# Patient Record
Sex: Female | Born: 1985 | Race: White | Hispanic: No | Marital: Single | Smoking: Current every day smoker
Health system: Southern US, Community
[De-identification: ages and names within clinical notes are randomized; demographics above are authoritative.]

## PROBLEM LIST (undated history)

## (undated) DIAGNOSIS — N83209 Unspecified ovarian cyst, unspecified side: Secondary | ICD-10-CM

## (undated) DIAGNOSIS — G8929 Other chronic pain: Secondary | ICD-10-CM

## (undated) DIAGNOSIS — F419 Anxiety disorder, unspecified: Secondary | ICD-10-CM

## (undated) DIAGNOSIS — R102 Pelvic and perineal pain unspecified side: Secondary | ICD-10-CM

## (undated) DIAGNOSIS — M549 Dorsalgia, unspecified: Secondary | ICD-10-CM

## (undated) DIAGNOSIS — R001 Bradycardia, unspecified: Secondary | ICD-10-CM

## (undated) DIAGNOSIS — Z765 Malingerer [conscious simulation]: Secondary | ICD-10-CM

## (undated) DIAGNOSIS — N289 Disorder of kidney and ureter, unspecified: Secondary | ICD-10-CM

## (undated) HISTORY — PX: APPENDECTOMY: SHX54

---

## 2008-10-29 ENCOUNTER — Ambulatory Visit: Payer: Self-pay | Admitting: Family Medicine

## 2010-01-17 ENCOUNTER — Emergency Department (HOSPITAL_COMMUNITY): Admission: EM | Admit: 2010-01-17 | Discharge: 2010-01-18 | Payer: Self-pay | Admitting: Emergency Medicine

## 2010-01-23 ENCOUNTER — Emergency Department (HOSPITAL_COMMUNITY): Admission: EM | Admit: 2010-01-23 | Discharge: 2010-01-23 | Payer: Self-pay | Admitting: Emergency Medicine

## 2010-07-02 ENCOUNTER — Emergency Department (HOSPITAL_COMMUNITY): Admission: EM | Admit: 2010-07-02 | Discharge: 2010-07-02 | Payer: Self-pay | Admitting: Emergency Medicine

## 2010-09-25 ENCOUNTER — Encounter: Payer: Self-pay | Admitting: Family Medicine

## 2010-09-27 ENCOUNTER — Emergency Department (HOSPITAL_COMMUNITY)
Admission: EM | Admit: 2010-09-27 | Discharge: 2010-09-27 | Disposition: A | Discharge status: Home / Self Care | Payer: Medicaid Other | Attending: Emergency Medicine | Admitting: Emergency Medicine

## 2010-09-27 ENCOUNTER — Emergency Department (HOSPITAL_COMMUNITY): Payer: Medicaid Other

## 2010-09-27 DIAGNOSIS — S20219A Contusion of unspecified front wall of thorax, initial encounter: Secondary | ICD-10-CM | POA: Insufficient documentation

## 2010-09-27 DIAGNOSIS — R071 Chest pain on breathing: Secondary | ICD-10-CM | POA: Insufficient documentation

## 2010-09-27 DIAGNOSIS — Y9229 Other specified public building as the place of occurrence of the external cause: Secondary | ICD-10-CM | POA: Insufficient documentation

## 2010-09-27 DIAGNOSIS — W52XXXA Crushed, pushed or stepped on by crowd or human stampede, initial encounter: Secondary | ICD-10-CM | POA: Insufficient documentation

## 2010-09-27 LAB — POCT PREGNANCY, URINE: Preg Test, Ur: NEGATIVE

## 2010-10-23 ENCOUNTER — Encounter: Payer: Self-pay | Admitting: Family Medicine

## 2011-01-25 ENCOUNTER — Emergency Department (HOSPITAL_COMMUNITY)
Admission: EM | Admit: 2011-01-25 | Discharge: 2011-01-25 | Disposition: A | Discharge status: Home / Self Care | Payer: Medicaid Other | Attending: Emergency Medicine | Admitting: Emergency Medicine

## 2011-01-25 ENCOUNTER — Emergency Department (HOSPITAL_COMMUNITY): Payer: Medicaid Other

## 2011-01-25 DIAGNOSIS — F17200 Nicotine dependence, unspecified, uncomplicated: Secondary | ICD-10-CM | POA: Insufficient documentation

## 2011-01-25 DIAGNOSIS — R109 Unspecified abdominal pain: Secondary | ICD-10-CM | POA: Insufficient documentation

## 2011-01-25 DIAGNOSIS — Z3201 Encounter for pregnancy test, result positive: Secondary | ICD-10-CM | POA: Insufficient documentation

## 2011-01-25 LAB — URINALYSIS, ROUTINE W REFLEX MICROSCOPIC
Nitrite: NEGATIVE
Protein, ur: NEGATIVE mg/dL
Urobilinogen, UA: 0.2 mg/dL (ref 0.0–1.0)

## 2011-01-25 LAB — BASIC METABOLIC PANEL
BUN: 8 mg/dL (ref 6–23)
CO2: 26 mEq/L (ref 19–32)
Calcium: 10 mg/dL (ref 8.4–10.5)
Glucose, Bld: 120 mg/dL — ABNORMAL HIGH (ref 70–99)
Potassium: 3 mEq/L — ABNORMAL LOW (ref 3.5–5.1)
Sodium: 132 mEq/L — ABNORMAL LOW (ref 135–145)

## 2011-01-25 LAB — DIFFERENTIAL
Basophils Absolute: 0 10*3/uL (ref 0.0–0.1)
Lymphocytes Relative: 21 % (ref 12–46)
Lymphs Abs: 3.2 10*3/uL (ref 0.7–4.0)
Monocytes Absolute: 0.6 10*3/uL (ref 0.1–1.0)
Neutro Abs: 11.3 10*3/uL — ABNORMAL HIGH (ref 1.7–7.7)

## 2011-01-25 LAB — CBC
HCT: 36.3 % (ref 36.0–46.0)
Hemoglobin: 12.9 g/dL (ref 12.0–15.0)
MCHC: 35.5 g/dL (ref 30.0–36.0)
MCV: 90.1 fL (ref 78.0–100.0)

## 2011-01-25 LAB — URINE MICROSCOPIC-ADD ON

## 2011-01-25 LAB — POCT PREGNANCY, URINE: Preg Test, Ur: POSITIVE

## 2011-01-25 LAB — WET PREP, GENITAL

## 2011-01-27 LAB — GC/CHLAMYDIA PROBE AMP, GENITAL
Chlamydia, DNA Probe: NEGATIVE
GC Probe Amp, Genital: NEGATIVE

## 2011-02-18 ENCOUNTER — Emergency Department (HOSPITAL_COMMUNITY)
Admission: EM | Admit: 2011-02-18 | Discharge: 2011-02-18 | Discharge status: Against Medical Advice | Payer: Medicaid Other | Attending: Emergency Medicine | Admitting: Emergency Medicine

## 2011-02-18 DIAGNOSIS — R109 Unspecified abdominal pain: Secondary | ICD-10-CM | POA: Insufficient documentation

## 2011-02-18 DIAGNOSIS — F17200 Nicotine dependence, unspecified, uncomplicated: Secondary | ICD-10-CM | POA: Insufficient documentation

## 2011-02-18 DIAGNOSIS — O99891 Other specified diseases and conditions complicating pregnancy: Secondary | ICD-10-CM | POA: Insufficient documentation

## 2011-02-18 LAB — URINALYSIS, ROUTINE W REFLEX MICROSCOPIC
Nitrite: NEGATIVE
Specific Gravity, Urine: 1.02 (ref 1.005–1.030)
pH: 8.5 — ABNORMAL HIGH (ref 5.0–8.0)

## 2011-02-18 LAB — URINE MICROSCOPIC-ADD ON

## 2011-02-18 LAB — POCT PREGNANCY, URINE: Preg Test, Ur: POSITIVE

## 2011-09-05 ENCOUNTER — Encounter (HOSPITAL_COMMUNITY): Payer: Self-pay

## 2011-09-05 ENCOUNTER — Emergency Department (HOSPITAL_COMMUNITY)
Admission: EM | Admit: 2011-09-05 | Discharge: 2011-09-05 | Disposition: A | Discharge status: Home / Self Care | Payer: Medicaid Other | Attending: Emergency Medicine | Admitting: Emergency Medicine

## 2011-09-05 DIAGNOSIS — R21 Rash and other nonspecific skin eruption: Secondary | ICD-10-CM | POA: Insufficient documentation

## 2011-09-05 DIAGNOSIS — R109 Unspecified abdominal pain: Secondary | ICD-10-CM | POA: Insufficient documentation

## 2011-09-05 DIAGNOSIS — B379 Candidiasis, unspecified: Secondary | ICD-10-CM | POA: Insufficient documentation

## 2011-09-05 MED ORDER — ONDANSETRON HCL 4 MG PO TABS
4.0000 mg | ORAL_TABLET | Freq: Once | ORAL | Status: AC
  Administered 2011-09-05: 4 mg via ORAL
  Filled 2011-09-05: qty 1

## 2011-09-05 MED ORDER — FLUCONAZOLE 200 MG PO TABS: 200.0000 mg | ORAL_TABLET | Freq: Every day | ORAL | Status: AC

## 2011-09-05 MED ORDER — FLUCONAZOLE 100 MG PO TABS
100.0000 mg | ORAL_TABLET | Freq: Once | ORAL | Status: AC
  Administered 2011-09-05: 100 mg via ORAL

## 2011-09-05 MED ORDER — HYDROCODONE-ACETAMINOPHEN 5-325 MG PO TABS
2.0000 | ORAL_TABLET | Freq: Once | ORAL | Status: AC
  Administered 2011-09-05: 2 via ORAL
  Filled 2011-09-05: qty 2

## 2011-09-05 MED ORDER — FLUCONAZOLE 100 MG PO TABS
ORAL_TABLET | ORAL | Status: AC
  Filled 2011-09-05: qty 1

## 2011-09-05 MED ORDER — HYDROCODONE-ACETAMINOPHEN 5-325 MG PO TABS: ORAL_TABLET | ORAL | Status: DC

## 2011-09-05 MED ORDER — TOLNAFTATE 1 % EX POWD: CUTANEOUS | Status: DC

## 2011-09-05 NOTE — ED Notes (Signed)
Upon assessment by PA foul smell noted from abdominal area. No pus noted. Abdomen warm to touch.

## 2011-09-05 NOTE — ED Notes (Signed)
Pt a/ox4. Resp even and unlabored. NAD at this time. D/C instructions reviewed with pt. Pt verbalized understanding. Pt ambulated to lobby with steady gate.  

## 2011-09-05 NOTE — ED Provider Notes (Signed)
History     CSN: [id number]  Arrival date & time 09/05/11  1958   None     Chief Complaint  Patient presents with  . Wound Infection    (Consider location/radiation/quality/duration/timing/severity/associated sxs/prior treatment) HPI Comments: Patient had a cesarean section on 08/07/2011. She has been doing fine up until approximately one week ago when she began to have some pain about the surgical site this was then followed by some" puslike material" from the site area and this was followed by increased redness of the area. The patient denies any injury to the surgical site. She denies any high fever. She denies nausea vomiting. She has not been to see her GYN during the time that these new symptoms has occurred. She became concerned and presents to the emergency department for additional evaluation of these symptoms.  The history is provided by the patient.    History reviewed. No pertinent past medical history.  Past Surgical History  Procedure Date  . Cesarean section     No family history on file.  History  Substance Use Topics  . Smoking status: Current Everyday Smoker -- 0.5 packs/day  . Smokeless tobacco: Not on file  . Alcohol Use: No    OB History    Grav Para Term Preterm Abortions TAB SAB Ect Mult Living   2 2 2       2       Review of Systems  Constitutional: Negative for activity change.       All ROS Neg except as noted in HPI  HENT: Negative for nosebleeds and neck pain.   Eyes: Negative for photophobia and discharge.  Respiratory: Negative for cough, shortness of breath and wheezing.   Cardiovascular: Negative for chest pain and palpitations.  Gastrointestinal: Positive for abdominal pain. Negative for blood in stool.  Genitourinary: Negative for dysuria, frequency and hematuria.  Musculoskeletal: Negative for back pain and arthralgias.  Skin: Positive for rash.  Neurological: Negative for dizziness, seizures and speech difficulty.    Psychiatric/Behavioral: Negative for hallucinations and confusion.    Allergies  Review of patient's allergies indicates no known allergies.  Home Medications  No current outpatient prescriptions on file.  BP 108/66  Pulse 60  Temp(Src) 98 F (36.7 C) (Oral)  Resp 18  Ht 5\' 6"  (1.676 m)  Wt 220 lb (99.791 kg)  BMI 35.51 kg/m2  SpO2 99%  LMP 08/30/2011  Physical Exam  Nursing note and vitals reviewed. Constitutional: She is oriented to person, place, and time. She appears well-developed and well-nourished.  Non-toxic appearance.  HENT:  Head: Normocephalic.  Right Ear: Tympanic membrane and external ear normal.  Left Ear: Tympanic membrane and external ear normal.  Eyes: EOM and lids are normal. Pupils are equal, round, and reactive to light.  Neck: Normal range of motion. Neck supple. Carotid bruit is not present.  Cardiovascular: Normal rate, regular rhythm, normal heart sounds, intact distal pulses and normal pulses.   Pulmonary/Chest: Breath sounds normal. No respiratory distress.  Abdominal: Soft. Bowel sounds are normal. There is no tenderness. There is no guarding.       Patient has a pannus. This covers the cesarean section scar. There is a small slow to heal area in the mid cesarean section wound. There is increase moisture under the pannus around the wound. There are no red streaks noted. There no satellite abscess seas present. The moist area has some slight increase odor to it.  Musculoskeletal: Normal range of motion.  Lymphadenopathy:  Head (right side): No submandibular adenopathy present.       Head (left side): No submandibular adenopathy present.    She has no cervical adenopathy.  Neurological: She is alert and oriented to person, place, and time. She has normal strength. No cranial nerve deficit or sensory deficit.  Skin: Skin is warm and dry.  Psychiatric: She has a normal mood and affect. Her speech is normal.    ED Course  Procedures (including  critical care time) Pulse oximetry 99% on room air. Within normal limits by my interpretation. Labs Reviewed - No data to display No results found.   Dx: 1. Postsurgical pain. 2. Candidiasis   MDM  I have reviewed nursing notes, vital signs, and all appropriate lab and imaging results for this patient.        Kathie Dike, [place] 09/05/11 2151

## 2011-09-05 NOTE — ED Notes (Signed)
Pt presents with redness to c-section site. Pt had baby on 08/07/2011. Site is red and pt reports pus drainage. No pus noted at this time.

## 2011-09-06 NOTE — ED Provider Notes (Signed)
Medical screening examination/treatment/procedure(s) were performed by non-physician practitioner and as supervising physician I was immediately available for consultation/collaboration.  Flint Melter, MD 09/06/11 [phone]

## 2011-09-24 ENCOUNTER — Emergency Department (HOSPITAL_COMMUNITY): Payer: Medicaid Other

## 2011-09-24 ENCOUNTER — Emergency Department (HOSPITAL_COMMUNITY)
Admission: EM | Admit: 2011-09-24 | Discharge: 2011-09-25 | Disposition: A | Discharge status: Home / Self Care | Payer: Medicaid Other | Attending: Emergency Medicine | Admitting: Emergency Medicine

## 2011-09-24 ENCOUNTER — Encounter (HOSPITAL_COMMUNITY): Payer: Self-pay | Admitting: Emergency Medicine

## 2011-09-24 DIAGNOSIS — S6391XA Sprain of unspecified part of right wrist and hand, initial encounter: Secondary | ICD-10-CM

## 2011-09-24 DIAGNOSIS — Y92009 Unspecified place in unspecified non-institutional (private) residence as the place of occurrence of the external cause: Secondary | ICD-10-CM | POA: Insufficient documentation

## 2011-09-24 DIAGNOSIS — S20219A Contusion of unspecified front wall of thorax, initial encounter: Secondary | ICD-10-CM | POA: Insufficient documentation

## 2011-09-24 DIAGNOSIS — F17200 Nicotine dependence, unspecified, uncomplicated: Secondary | ICD-10-CM | POA: Insufficient documentation

## 2011-09-24 DIAGNOSIS — W010XXA Fall on same level from slipping, tripping and stumbling without subsequent striking against object, initial encounter: Secondary | ICD-10-CM

## 2011-09-24 DIAGNOSIS — S6390XA Sprain of unspecified part of unspecified wrist and hand, initial encounter: Secondary | ICD-10-CM | POA: Insufficient documentation

## 2011-09-24 DIAGNOSIS — W1809XA Striking against other object with subsequent fall, initial encounter: Secondary | ICD-10-CM | POA: Insufficient documentation

## 2011-09-24 MED ORDER — IBUPROFEN 800 MG PO TABS
800.0000 mg | ORAL_TABLET | Freq: Once | ORAL | Status: AC
  Administered 2011-09-24: 800 mg via ORAL
  Filled 2011-09-24: qty 1

## 2011-09-24 NOTE — ED Notes (Signed)
Pt fell in shower injuring her right hand and right rib care, no LOC

## 2011-09-24 NOTE — ED Notes (Signed)
Pt returned from xray

## 2011-09-24 NOTE — ED Notes (Signed)
Pt reports taking a shower when she fell on her right hand and hit her right side on the tub. Pt reports that she can't move her hand. Pt states she has rib pain when coughing and taking a deep breath. Breath sounds clear and equal. Pt states pain 8/10.

## 2011-09-24 NOTE — ED Notes (Signed)
Pt to xray

## 2011-09-24 NOTE — ED Provider Notes (Signed)
History     CSN: [id number]  Arrival date & time 09/24/11  2254   First MD Initiated Contact with Patient 09/24/11 2303      Chief Complaint  Patient presents with  . Hand Injury  . Rib Injury    (Consider location/radiation/quality/duration/timing/severity/associated sxs/prior treatment) HPI Comments: She did not hit her head and there was no LOC.  Patient is a 26 y.o. female presenting with hand injury. The history is provided by the patient.  Hand Injury  The incident occurred 1 to 2 hours ago. The incident occurred at home. Injury mechanism: she slipped in the shower,  landing with right hand outstretched and hitting her right rib cage against the side of the tub. The pain is present in the right hand. The quality of the pain is described as sharp. The pain is at a severity of 7/10. The pain is moderate. The pain has been constant since the incident. Pertinent negatives include no fever. The symptoms are aggravated by movement and palpation. She has tried nothing for the symptoms.    History reviewed. No pertinent past medical history.  Past Surgical History  Procedure Date  . Cesarean section     History reviewed. No pertinent family history.  History  Substance Use Topics  . Smoking status: Current Everyday Smoker -- 0.5 packs/day  . Smokeless tobacco: Not on file  . Alcohol Use: No    OB History    Grav Para Term Preterm Abortions TAB SAB Ect Mult Living   2 2 2       2       Review of Systems  Constitutional: Negative for fever.  HENT: Negative for congestion, sore throat and neck pain.   Eyes: Negative.   Respiratory: Negative for chest tightness and shortness of breath.   Cardiovascular: Negative for chest pain.  Gastrointestinal: Negative for nausea and abdominal pain.  Genitourinary: Negative.   Musculoskeletal: Positive for arthralgias. Negative for joint swelling.  Skin: Negative.  Negative for color change, rash and wound.  Neurological: Negative  for dizziness, weakness, light-headedness, numbness and headaches.  Hematological: Negative.   Psychiatric/Behavioral: Negative.     Allergies  Review of patient's allergies indicates no known allergies.  Home Medications   Current Outpatient Rx  Name Route Sig Dispense Refill  . HYDROCODONE-ACETAMINOPHEN 5-325 MG PO TABS  1 or 2 po q4h prn pain 20 tablet 0  . IBUPROFEN 800 MG PO TABS Oral Take 1 tablet (800 mg total) by mouth every 8 (eight) hours as needed for pain. 15 tablet 0  . TOLNAFTATE 1 % EX POWD  Apply to affected area 3 times daily. 45 g 1    BP 117/63  Pulse 67  Temp(Src) 98.3 F (36.8 C) (Oral)  Resp 20  Ht 5\' 6"  (1.676 m)  Wt 213 lb (96.616 kg)  BMI 34.38 kg/m2  SpO2 98%  LMP 09/09/2011  Physical Exam  Nursing note and vitals reviewed. Constitutional: She is oriented to person, place, and time. She appears well-developed and well-nourished.  HENT:  Head: Normocephalic and atraumatic.  Eyes: Conjunctivae are normal.  Neck: Normal range of motion.  Cardiovascular: Normal rate and intact distal pulses.  Exam reveals no decreased pulses.   Pulses:      Dorsalis pedis pulses are 2+ on the right side, and 2+ on the left side.       Posterior tibial pulses are 2+ on the right side, and 2+ on the left side.  Pulmonary/Chest: Effort normal. She  exhibits bony tenderness. She exhibits no edema, no deformity and no swelling.    Abdominal: There is no tenderness.  Musculoskeletal: She exhibits tenderness. She exhibits no edema.       Right hand: She exhibits decreased range of motion and tenderness. She exhibits normal capillary refill, no deformity and no swelling. normal sensation noted. Decreased strength noted.       Hands:      Grip strength reduced secondary to pain.   Neurological: She is alert and oriented to person, place, and time. No sensory deficit.  Skin: Skin is warm, dry and intact.    ED Course  Procedures (including critical care time)  Labs  Reviewed - No data to display Dg Ribs Unilateral W/chest Right  09/25/2011  *RADIOLOGY REPORT*  Clinical Data: Fall striking right anterior ribs  RIGHT RIBS AND CHEST - 3+ VIEW  Comparison: 09/27/2010  Findings: Upper-normal size of cardiac silhouette. Mediastinal contours and pulmonary vascularity normal. Lungs clear. No pleural effusion or pneumothorax. Osseous mineralization grossly normal. No rib fracture or bone destruction seen.  IMPRESSION: No acute abnormalities.  Original Report Authenticated By: Lollie Marrow, M.D.   Dg Hand Complete Right  09/25/2011  *RADIOLOGY REPORT*  Clinical Data: Hand injury, fall, pain at right metacarpals  RIGHT HAND - COMPLETE 3+ VIEW  Comparison: None  Findings: Bone mineralization normal. Joint spaces preserved. No fracture, dislocation, or bone destruction.  IMPRESSION: No acute abnormalities.  Original Report Authenticated By: Lollie Marrow, M.D.     1. Rib contusion   2. Sprain of hand, right   3. Fall due to wet surface       MDM  Ace wrap to right hand.  Ibuprofen,  RICE.        Candis Musa, PA 09/25/11 0021

## 2011-09-25 MED ORDER — IBUPROFEN 800 MG PO TABS: 800.0000 mg | ORAL_TABLET | Freq: Three times a day (TID) | ORAL | Status: AC | PRN

## 2011-09-25 NOTE — ED Provider Notes (Signed)
Medical screening examination/treatment/procedure(s) were performed by non-physician practitioner and as supervising physician I was immediately available for consultation/collaboration.   Benny Lennert, MD 09/25/11 [phone]

## 2011-09-25 NOTE — ED Notes (Signed)
Ice given to apply to right hand.

## 2012-03-19 ENCOUNTER — Emergency Department (HOSPITAL_COMMUNITY): Payer: Medicaid Other

## 2012-03-19 ENCOUNTER — Emergency Department (HOSPITAL_COMMUNITY)
Admission: EM | Admit: 2012-03-19 | Discharge: 2012-03-19 | Disposition: A | Discharge status: Home / Self Care | Payer: Medicaid Other | Attending: Emergency Medicine | Admitting: Emergency Medicine

## 2012-03-19 ENCOUNTER — Encounter (HOSPITAL_COMMUNITY): Payer: Self-pay

## 2012-03-19 DIAGNOSIS — M25473 Effusion, unspecified ankle: Secondary | ICD-10-CM | POA: Insufficient documentation

## 2012-03-19 DIAGNOSIS — M25579 Pain in unspecified ankle and joints of unspecified foot: Secondary | ICD-10-CM | POA: Insufficient documentation

## 2012-03-19 DIAGNOSIS — X500XXA Overexertion from strenuous movement or load, initial encounter: Secondary | ICD-10-CM | POA: Insufficient documentation

## 2012-03-19 DIAGNOSIS — S93401A Sprain of unspecified ligament of right ankle, initial encounter: Secondary | ICD-10-CM

## 2012-03-19 DIAGNOSIS — M25476 Effusion, unspecified foot: Secondary | ICD-10-CM | POA: Insufficient documentation

## 2012-03-19 DIAGNOSIS — S93409A Sprain of unspecified ligament of unspecified ankle, initial encounter: Secondary | ICD-10-CM | POA: Insufficient documentation

## 2012-03-19 MED ORDER — HYDROCODONE-ACETAMINOPHEN 5-325 MG PO TABS: 1.0000 | ORAL_TABLET | ORAL | Status: AC | PRN

## 2012-03-19 MED ORDER — HYDROCODONE-ACETAMINOPHEN 5-325 MG PO TABS
1.0000 | ORAL_TABLET | Freq: Once | ORAL | Status: AC
  Administered 2012-03-19: 1 via ORAL
  Filled 2012-03-19: qty 1

## 2012-03-19 NOTE — ED Provider Notes (Signed)
Medical screening examination/treatment/procedure(s) were performed by non-physician practitioner and as supervising physician I was immediately available for consultation/collaboration.   Carleene Cooper III, MD 03/19/12 [phone]

## 2012-03-19 NOTE — ED Provider Notes (Signed)
History     CSN: [id number]  Arrival date & time 03/19/12  1730   First MD Initiated Contact with Patient 03/19/12 1731      Chief Complaint  Patient presents with  . Ankle Pain    (Consider location/radiation/quality/duration/timing/severity/associated sxs/prior treatment) HPI Comments: Valerie Robinson presents with injury to her right ankle she sustained yesterday when she twisted it coming out of her attic.  She has used ice, elevation and motrin 800 mg without improvement in pain,  But swelling has improved.  She denies weakness or numbness distal to the injury site,   And denies any other injury.  Pain is constant,  Aching and worse with weight bearing.  The history is provided by the patient.    History reviewed. No pertinent past medical history.  Past Surgical History  Procedure Date  . Cesarean section     No family history on file.  History  Substance Use Topics  . Smoking status: Current Everyday Smoker -- 0.5 packs/day    Types: Cigarettes  . Smokeless tobacco: Not on file  . Alcohol Use: No    OB History    Grav Para Term Preterm Abortions TAB SAB Ect Mult Living   2 2 2       2       Review of Systems  Musculoskeletal: Positive for joint swelling and arthralgias.  Skin: Negative for wound.  Neurological: Negative for weakness and numbness.    Allergies  Review of patient's allergies indicates no known allergies.  Home Medications   Current Outpatient Rx  Name Route Sig Dispense Refill  . HYDROCODONE-ACETAMINOPHEN 5-325 MG PO TABS  1 or 2 po q4h prn pain 20 tablet 0  . HYDROCODONE-ACETAMINOPHEN 5-325 MG PO TABS Oral Take 1 tablet by mouth every 4 (four) hours as needed for pain. 15 tablet 0  . TOLNAFTATE 1 % EX POWD  Apply to affected area 3 times daily. 45 g 1    BP 122/59  Pulse 82  Temp 98 F (36.7 C) (Oral)  Resp 18  Ht 5\' 6"  (1.676 m)  Wt 215 lb (97.523 kg)  BMI 34.70 kg/m2  SpO2 100%  Physical Exam  Nursing note and vitals  reviewed. Constitutional: She appears well-developed and well-nourished.  HENT:  Head: Normocephalic.  Cardiovascular: Normal rate and intact distal pulses.  Exam reveals no decreased pulses.   Pulses:      Dorsalis pedis pulses are 2+ on the right side, and 2+ on the left side.       Posterior tibial pulses are 2+ on the right side, and 2+ on the left side.  Musculoskeletal: She exhibits edema and tenderness.       Right ankle: She exhibits decreased range of motion. She exhibits no swelling, no ecchymosis and normal pulse. tenderness. Medial malleolus tenderness found. No head of 5th metatarsal and no proximal fibula tenderness found. Achilles tendon normal.  Neurological: She is alert. No sensory deficit.  Skin: Skin is warm, dry and intact.    ED Course  Procedures (including critical care time)  Labs Reviewed - No data to display Dg Ankle Complete Right  03/19/2012  *RADIOLOGY REPORT*  Clinical Data: Twisted ankle  RIGHT ANKLE - COMPLETE 3+ VIEW  Comparison: None.  Findings: Negative for fracture.  Normal alignment and no significant joint space narrowing or effusion. Small exostosis off the dorsal cortex of the talus.  IMPRESSION: Negative  Original Report Authenticated By: Camelia Phenes, M.D.  1. Right ankle sprain       MDM  xrays reviewed and discussed with pt.  ASO and crutches provided.  Cap refill normal after ASO applied.  RICE, referral to ortho if pain symptoms and swelling are not better over the next 10 days.           Burgess Amor, [place] 03/19/12 1839

## 2012-03-19 NOTE — ED Notes (Signed)
Twisted right ankle yesterday while coming out of attic, cont. To have pain

## 2012-04-25 ENCOUNTER — Emergency Department (HOSPITAL_COMMUNITY)
Admission: EM | Admit: 2012-04-25 | Discharge: 2012-04-25 | Disposition: A | Discharge status: Home / Self Care | Payer: Medicaid Other

## 2012-04-25 NOTE — ED Notes (Signed)
No answer

## 2012-04-29 ENCOUNTER — Encounter (HOSPITAL_COMMUNITY): Payer: Self-pay | Admitting: *Deleted

## 2012-04-29 ENCOUNTER — Emergency Department (HOSPITAL_COMMUNITY)
Admission: EM | Admit: 2012-04-29 | Discharge: 2012-04-30 | Disposition: A | Discharge status: Home / Self Care | Payer: Medicaid Other | Attending: Emergency Medicine | Admitting: Emergency Medicine

## 2012-04-29 ENCOUNTER — Emergency Department (HOSPITAL_COMMUNITY): Payer: Medicaid Other

## 2012-04-29 DIAGNOSIS — W19XXXA Unspecified fall, initial encounter: Secondary | ICD-10-CM

## 2012-04-29 DIAGNOSIS — M25539 Pain in unspecified wrist: Secondary | ICD-10-CM | POA: Insufficient documentation

## 2012-04-29 DIAGNOSIS — S6990XA Unspecified injury of unspecified wrist, hand and finger(s), initial encounter: Secondary | ICD-10-CM

## 2012-04-29 DIAGNOSIS — M25529 Pain in unspecified elbow: Secondary | ICD-10-CM | POA: Insufficient documentation

## 2012-04-29 DIAGNOSIS — F17200 Nicotine dependence, unspecified, uncomplicated: Secondary | ICD-10-CM | POA: Insufficient documentation

## 2012-04-29 MED ORDER — NAPROXEN 500 MG PO TABS: 500.0000 mg | ORAL_TABLET | Freq: Two times a day (BID) | ORAL | Status: DC

## 2012-04-29 MED ORDER — NAPROXEN 250 MG PO TABS
500.0000 mg | ORAL_TABLET | Freq: Once | ORAL | Status: DC
  Filled 2012-04-29: qty 2

## 2012-04-29 NOTE — ED Provider Notes (Signed)
History   This chart was scribed for [employer]. Colon Branch, MD by Toya Smothers. The patient was seen in room APA12/APA12. Patient's care was started at 2138.  CSN: [id number]  Arrival date & time 04/29/12  2138   First MD Initiated Contact with Patient 04/29/12 2311      Chief Complaint  Patient presents with  . Wrist Pain  . Elbow Pain   Patient is a 26 y.o. female presenting with wrist pain. The history is provided by the patient. No language interpreter was used.  Wrist Pain Pertinent negatives include no chest pain, no abdominal pain, no headaches and no shortness of breath.   Valerie Robinson is a 26 y.o. female presents to the ED complaining of 2 days of right wrist and right elbow pain as the result of a fall. Pt reports falling while standing onto a concrete surface, landing directly onto the right forearm. Pain is described as a dull sorness, and is aggravated with palpation. Pt endorses no associate symptoms. Prior to arrival Pt has not treated symptoms. Denies fever, cough, chills, numbness, and sore throat. Pt is a current everyday smoker.  History reviewed. No pertinent past medical history.  Past Surgical History  Procedure Date  . Cesarean section     History reviewed. No pertinent family history.  History  Substance Use Topics  . Smoking status: Current Everyday Smoker -- 0.5 packs/day    Types: Cigarettes  . Smokeless tobacco: Not on file  . Alcohol Use: No    OB History    Grav Para Term Preterm Abortions TAB SAB Ect Mult Living   2 2 2       2       Review of Systems  Constitutional: Negative for fever.  HENT: Negative for rhinorrhea.   Eyes: Negative for pain.  Respiratory: Negative for cough and shortness of breath.   Cardiovascular: Negative for chest pain.  Gastrointestinal: Negative for nausea, vomiting, abdominal pain and diarrhea.  Genitourinary: Negative for dysuria.  Musculoskeletal: Positive for arthralgias (Right Wrist Pain). Negative for back  pain.  Skin: Negative for rash.  Neurological: Negative for weakness and headaches.    Allergies  Review of patient's allergies indicates no known allergies.  Home Medications   Current Outpatient Rx  Name Route Sig Dispense Refill  . ALPRAZOLAM 0.5 MG PO TABS Oral Take 0.5 mg by mouth 3 (three) times daily as needed. nerves    . CYCLOBENZAPRINE HCL 10 MG PO TABS Oral Take 20 mg by mouth at bedtime.      BP 114/76  Pulse 90  Temp 98.6 F (37 C) (Oral)  Resp 20  Ht 5\' 6"  (1.676 m)  Wt 215 lb (97.523 kg)  BMI 34.70 kg/m2  SpO2 100%  Physical Exam  Nursing note and vitals reviewed. Constitutional: She is oriented to person, place, and time. She appears well-developed and well-nourished. No distress.  HENT:  Head: Normocephalic and atraumatic.  Right Ear: External ear normal.  Left Ear: External ear normal.  Eyes: Conjunctivae are normal. Right eye exhibits no discharge. Left eye exhibits no discharge. No scleral icterus.  Neck: Neck supple. No tracheal deviation present.  Cardiovascular: Normal rate.   Pulmonary/Chest: Effort normal. No stridor. No respiratory distress.  Musculoskeletal: She exhibits no edema.       No swelling no bruising. Intact pulses. Able to move phalangeals. Rom limited because of discomfort.  Neurological: She is alert and oriented to person, place, and time. Cranial nerve deficit: no gross  deficits.  Skin: Skin is warm and dry. No rash noted.  Psychiatric: She has a normal mood and affect.    ED Course  Procedures (including critical care time) DIAGNOSTIC STUDIES: Oxygen Saturation is 100% on room air, normal by my interpretation.    COORDINATION OF CARE: 23:23- Evaluated Pt. Pt is awake, alert, and oriented.   Labs Reviewed - No data to display Dg Wrist Complete Right  04/29/2012  *RADIOLOGY REPORT*  Clinical Data: Right wrist pain for 2 days.  RIGHT WRIST - COMPLETE 3+ VIEW  Comparison: 09/24/2011  Findings: Right wrist appears intact.  No  evidence of acute fracture or subluxation.  No focal bone lesions.  Bone matrix and cortex appear intact.  No abnormal radiopaque densities in the soft tissues.  No significant change since previous study.  IMPRESSION: No acute bony abnormalities.   Original Report Authenticated By: Marlon Pel, M.D.      No diagnosis found.    MDM  Patient with right wrist and elbow pain following a fall. Xrays are negative for fracture. Placed in wrist splint. Given a paper copy of film. Pt stable in ED with no significant deterioration in condition.The patient appears reasonably screened and/or stabilized for discharge and I doubt any other medical condition or other [facility] requiring further screening, evaluation, or treatment in the ED at this time prior to discharge.  I personally performed the services described in this documentation, which was scribed in my presence. The recorded information has been reviewed and considered.   MDM Reviewed: nursing note and vitals Interpretation: x-ray           Nicoletta Dress. Colon Branch, MD 04/30/12 [phone]

## 2012-04-29 NOTE — ED Notes (Signed)
Pt fell Wednesday, now c/o right wrist and elbow pain.

## 2012-04-29 NOTE — ED Notes (Signed)
Fell on R hand Wednesday.  Swelling and pain have con't. Despite use of ice and tylenol/ibuprofen.  Slight swelling noted just to right of thumb, no redness, no obvious deformity.  Tender to palpation.

## 2012-04-29 NOTE — ED Notes (Signed)
Patient not in room in order to give pain medication and put on splint

## 2012-04-30 NOTE — ED Notes (Addendum)
Patient not in room x 2.  Checked triage waiting and patient is not there.

## 2012-07-28 ENCOUNTER — Emergency Department (HOSPITAL_COMMUNITY): Payer: Medicaid Other

## 2012-07-28 ENCOUNTER — Encounter (HOSPITAL_COMMUNITY): Payer: Self-pay | Admitting: *Deleted

## 2012-07-28 ENCOUNTER — Emergency Department (HOSPITAL_COMMUNITY)
Admission: EM | Admit: 2012-07-28 | Discharge: 2012-07-28 | Disposition: A | Discharge status: Home / Self Care | Payer: Medicaid Other | Attending: Emergency Medicine | Admitting: Emergency Medicine

## 2012-07-28 DIAGNOSIS — S86919A Strain of unspecified muscle(s) and tendon(s) at lower leg level, unspecified leg, initial encounter: Secondary | ICD-10-CM

## 2012-07-28 DIAGNOSIS — Y92009 Unspecified place in unspecified non-institutional (private) residence as the place of occurrence of the external cause: Secondary | ICD-10-CM | POA: Insufficient documentation

## 2012-07-28 DIAGNOSIS — IMO0002 Reserved for concepts with insufficient information to code with codable children: Secondary | ICD-10-CM | POA: Insufficient documentation

## 2012-07-28 DIAGNOSIS — M25579 Pain in unspecified ankle and joints of unspecified foot: Secondary | ICD-10-CM

## 2012-07-28 DIAGNOSIS — F17200 Nicotine dependence, unspecified, uncomplicated: Secondary | ICD-10-CM | POA: Insufficient documentation

## 2012-07-28 DIAGNOSIS — W172XXA Fall into hole, initial encounter: Secondary | ICD-10-CM | POA: Insufficient documentation

## 2012-07-28 DIAGNOSIS — Y9389 Activity, other specified: Secondary | ICD-10-CM | POA: Insufficient documentation

## 2012-07-28 MED ORDER — IBUPROFEN 800 MG PO TABS
800.0000 mg | ORAL_TABLET | Freq: Once | ORAL | Status: AC
  Administered 2012-07-28: 800 mg via ORAL
  Filled 2012-07-28: qty 1

## 2012-07-28 MED ORDER — ACETAMINOPHEN 500 MG PO TABS
1000.0000 mg | ORAL_TABLET | Freq: Once | ORAL | Status: AC
  Administered 2012-07-28: 1000 mg via ORAL
  Filled 2012-07-28: qty 2

## 2012-07-28 NOTE — ED Provider Notes (Signed)
History     CSN: [id number]  Arrival date & time 07/28/12  2005   First MD Initiated Contact with Patient 07/28/12 2046      Chief Complaint  Patient presents with  . Ankle Pain  . Knee Injury    (Consider location/radiation/quality/duration/timing/severity/associated sxs/prior treatment) Patient is a 26 y.o. female presenting with ankle pain. The history is provided by the patient.  Ankle Pain  The incident occurred yesterday. The incident occurred at home. Injury mechanism: Pt stepped in a hole and twisted the right knee and ankle. The pain is present in the right knee and right ankle. The quality of the pain is described as aching. The pain is moderate. Pertinent negatives include no numbness, no inability to bear weight and no loss of motion. She reports no foreign bodies present. The symptoms are aggravated by bearing weight. She has tried acetaminophen for the symptoms.    History reviewed. No pertinent past medical history.  Past Surgical History  Procedure Date  . Cesarean section     No family history on file.  History  Substance Use Topics  . Smoking status: Current Every Day Smoker -- 0.5 packs/day    Types: Cigarettes  . Smokeless tobacco: Not on file  . Alcohol Use: No    OB History    Grav Para Term Preterm Abortions TAB SAB Ect Mult Living   2 2 2       2       Review of Systems  Constitutional: Negative for activity change.       All ROS Neg except as noted in HPI  HENT: Negative for nosebleeds and neck pain.   Eyes: Negative for photophobia and discharge.  Respiratory: Negative for cough, shortness of breath and wheezing.   Cardiovascular: Negative for chest pain and palpitations.  Gastrointestinal: Negative for abdominal pain and blood in stool.  Genitourinary: Negative for dysuria, frequency and hematuria.  Musculoskeletal: Positive for arthralgias. Negative for back pain.  Skin: Negative.   Neurological: Negative for dizziness, seizures,  speech difficulty and numbness.  Psychiatric/Behavioral: Negative for hallucinations and confusion.    Allergies  Review of patient's allergies indicates no known allergies.  Home Medications   Current Outpatient Rx  Name  Route  Sig  Dispense  Refill  . ACETAMINOPHEN 500 MG PO TABS   Oral   Take 1,000 mg by mouth 2 (two) times daily as needed. For pain         . LEVONORGESTREL 20 MCG/24HR IU IUD   Intrauterine   1 each by Intrauterine route once.           BP 107/59  Pulse 71  Temp 98.5 F (36.9 C) (Oral)  Resp 20  Ht 5\' 6"  (1.676 m)  Wt 215 lb (97.523 kg)  BMI 34.70 kg/m2  SpO2 100%  LMP 07/28/2012  Physical Exam  Nursing note and vitals reviewed. Constitutional: She is oriented to person, place, and time. She appears well-developed and well-nourished.  Non-toxic appearance.  HENT:  Head: Normocephalic.  Right Ear: Tympanic membrane and external ear normal.  Left Ear: Tympanic membrane and external ear normal.  Eyes: EOM and lids are normal. Pupils are equal, round, and reactive to light.  Neck: Normal range of motion. Neck supple. Carotid bruit is not present.  Cardiovascular: Normal rate, regular rhythm, normal heart sounds, intact distal pulses and normal pulses.   Pulmonary/Chest: Breath sounds normal. No respiratory distress.  Abdominal: Soft. Bowel sounds are normal. There is no tenderness.  There is no guarding.  Musculoskeletal: Normal range of motion.       There is soreness with range of motion of the right knee. There is no effusion present. No hot joints present. No deformity appreciated.  There is soreness with range of motion of the right ankle. The Achilles tendon is intact. Dorsalis pedis and posterior tibial are 2+ and symmetrical. Is good capillary refill of the toes. And is good range of motion of the toes without problem.  Lymphadenopathy:       Head (right side): No submandibular adenopathy present.       Head (left side): No submandibular  adenopathy present.    She has no cervical adenopathy.  Neurological: She is alert and oriented to person, place, and time. She has normal strength. No cranial nerve deficit or sensory deficit.  Skin: Skin is warm and dry.  Psychiatric: She has a normal mood and affect. Her speech is normal.    ED Course  Procedures (including critical care time)  Labs Reviewed - No data to display Dg Ankle Complete Right  07/28/2012  *RADIOLOGY REPORT*  Clinical Data: Twisting injury; right ankle pain.  RIGHT ANKLE - COMPLETE 3+ VIEW  Comparison: None.  Findings: There is no evidence of fracture or dislocation.  The ankle mortise is intact; the interosseous space is within normal limits.  No talar tilt or subluxation is seen.  The joint spaces are preserved.  No significant soft tissue abnormalities are seen.  IMPRESSION: No evidence of fracture or dislocation.   Original Report Authenticated By: Tonia Ghent, M.D.    Dg Knee Complete 4 Views Right  07/28/2012  *RADIOLOGY REPORT*  Clinical Data: Twisting injury; right knee pain.  RIGHT KNEE - COMPLETE 4+ VIEW  Comparison: None.  Findings: There is no evidence of fracture or dislocation.  The joint spaces are preserved.  No significant degenerative change is seen; the patellofemoral joint is grossly unremarkable in appearance.  Trace knee joint fluid remains within normal limits.  The visualized soft tissues are normal in appearance.  IMPRESSION: No evidence of fracture or dislocation.   Original Report Authenticated By: Tonia Ghent, M.D.      1. Knee strain   2. Ankle pain       MDM  I have reviewed nursing notes, vital signs, and all appropriate lab and imaging results for this patient. The x-ray of the right ankle is negative for fracture or dislocation. The x-ray of the right knee is negative for fracture or dislocation. The patient is fitted with a knee immobilizer. Given an ice pack. Patient is also given the name of the orthopedic physician  on-call for followup and recheck if not improving.       Kathie Dike, [place] 07/28/12 2225

## 2012-07-28 NOTE — ED Notes (Signed)
Fell and twisted leg/ right knee and ankle pain from the fall yesterday

## 2012-07-28 NOTE — ED Notes (Signed)
Pt twisted right ankle & knee yesterday when she stepped in a hole. Pain w/ movement. Good pulses present & cap refill < 2 seconds.

## 2012-07-28 NOTE — ED Notes (Signed)
Pt alert & oriented x4, stable gait. Patient given discharge instructions, paperwork & prescription(s). Patient  instructed to stop at the registration desk to finish any additional paperwork. Patient verbalized understanding. Pt left department w/ no further questions. 

## 2012-07-29 NOTE — ED Provider Notes (Signed)
Medical screening examination/treatment/procedure(s) were performed by non-physician practitioner and as supervising physician I was immediately available for consultation/collaboration.   Talmage Teaster W. Ramona Slinger, MD 07/29/12 1633 

## 2012-08-24 HISTORY — PX: CHOLECYSTECTOMY: SHX55

## 2012-12-15 ENCOUNTER — Emergency Department (HOSPITAL_COMMUNITY)
Admission: EM | Admit: 2012-12-15 | Discharge: 2012-12-15 | Disposition: A | Discharge status: Home / Self Care | Payer: Medicaid Other | Attending: Emergency Medicine | Admitting: Emergency Medicine

## 2012-12-15 ENCOUNTER — Encounter (HOSPITAL_COMMUNITY): Payer: Self-pay | Admitting: *Deleted

## 2012-12-15 ENCOUNTER — Emergency Department (HOSPITAL_COMMUNITY): Payer: Medicaid Other

## 2012-12-15 DIAGNOSIS — Y9301 Activity, walking, marching and hiking: Secondary | ICD-10-CM | POA: Insufficient documentation

## 2012-12-15 DIAGNOSIS — W172XXA Fall into hole, initial encounter: Secondary | ICD-10-CM | POA: Insufficient documentation

## 2012-12-15 DIAGNOSIS — S8391XA Sprain of unspecified site of right knee, initial encounter: Secondary | ICD-10-CM

## 2012-12-15 DIAGNOSIS — Z79899 Other long term (current) drug therapy: Secondary | ICD-10-CM | POA: Insufficient documentation

## 2012-12-15 DIAGNOSIS — X500XXA Overexertion from strenuous movement or load, initial encounter: Secondary | ICD-10-CM | POA: Insufficient documentation

## 2012-12-15 DIAGNOSIS — S93409A Sprain of unspecified ligament of unspecified ankle, initial encounter: Secondary | ICD-10-CM | POA: Insufficient documentation

## 2012-12-15 DIAGNOSIS — IMO0002 Reserved for concepts with insufficient information to code with codable children: Secondary | ICD-10-CM | POA: Insufficient documentation

## 2012-12-15 DIAGNOSIS — Y9289 Other specified places as the place of occurrence of the external cause: Secondary | ICD-10-CM | POA: Insufficient documentation

## 2012-12-15 DIAGNOSIS — F17200 Nicotine dependence, unspecified, uncomplicated: Secondary | ICD-10-CM | POA: Insufficient documentation

## 2012-12-15 MED ORDER — HYDROCODONE-ACETAMINOPHEN 5-325 MG PO TABS: 1.0000 | ORAL_TABLET | ORAL | Status: DC | PRN

## 2012-12-15 MED ORDER — HYDROCODONE-ACETAMINOPHEN 5-325 MG PO TABS
1.0000 | ORAL_TABLET | Freq: Once | ORAL | Status: AC
  Administered 2012-12-15: 1 via ORAL
  Filled 2012-12-15: qty 1

## 2012-12-15 NOTE — ED Notes (Signed)
Stepped in a hole, Pain rt ankle and knee.
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
## 2012-12-20 NOTE — ED Provider Notes (Signed)
Medical screening examination/treatment/procedure(s) were performed by non-physician practitioner and as supervising physician I was immediately available for consultation/collaboration. Anselm Aumiller, MD, FACEP   Lagena Strand L Ladawn Boullion, MD 12/20/12 0016 

## 2013-01-05 ENCOUNTER — Encounter (HOSPITAL_COMMUNITY): Payer: Self-pay | Admitting: Emergency Medicine

## 2013-01-05 ENCOUNTER — Emergency Department (HOSPITAL_COMMUNITY): Payer: Medicaid Other

## 2013-01-05 ENCOUNTER — Emergency Department (HOSPITAL_COMMUNITY)
Admission: EM | Admit: 2013-01-05 | Discharge: 2013-01-05 | Disposition: A | Discharge status: Home / Self Care | Payer: Medicaid Other | Attending: Emergency Medicine | Admitting: Emergency Medicine

## 2013-01-05 DIAGNOSIS — Z8742 Personal history of other diseases of the female genital tract: Secondary | ICD-10-CM | POA: Insufficient documentation

## 2013-01-05 DIAGNOSIS — W010XXA Fall on same level from slipping, tripping and stumbling without subsequent striking against object, initial encounter: Secondary | ICD-10-CM | POA: Insufficient documentation

## 2013-01-05 DIAGNOSIS — S63509A Unspecified sprain of unspecified wrist, initial encounter: Secondary | ICD-10-CM | POA: Insufficient documentation

## 2013-01-05 DIAGNOSIS — S20211A Contusion of right front wall of thorax, initial encounter: Secondary | ICD-10-CM

## 2013-01-05 DIAGNOSIS — Z79899 Other long term (current) drug therapy: Secondary | ICD-10-CM | POA: Insufficient documentation

## 2013-01-05 DIAGNOSIS — Z87891 Personal history of nicotine dependence: Secondary | ICD-10-CM | POA: Insufficient documentation

## 2013-01-05 DIAGNOSIS — S63501A Unspecified sprain of right wrist, initial encounter: Secondary | ICD-10-CM

## 2013-01-05 DIAGNOSIS — S20219A Contusion of unspecified front wall of thorax, initial encounter: Secondary | ICD-10-CM | POA: Insufficient documentation

## 2013-01-05 DIAGNOSIS — Y9389 Activity, other specified: Secondary | ICD-10-CM | POA: Insufficient documentation

## 2013-01-05 DIAGNOSIS — Y9289 Other specified places as the place of occurrence of the external cause: Secondary | ICD-10-CM | POA: Insufficient documentation

## 2013-01-05 DIAGNOSIS — R000 Tachycardia, unspecified: Secondary | ICD-10-CM | POA: Insufficient documentation

## 2013-01-05 HISTORY — DX: Unspecified ovarian cyst, unspecified side: N83.209

## 2013-01-05 MED ORDER — HYDROCODONE-ACETAMINOPHEN 5-325 MG PO TABS: 1.0000 | ORAL_TABLET | ORAL | Status: DC | PRN

## 2013-01-05 MED ORDER — OXYCODONE-ACETAMINOPHEN 5-325 MG PO TABS
1.0000 | ORAL_TABLET | Freq: Once | ORAL | Status: AC
  Administered 2013-01-05: 1 via ORAL
  Filled 2013-01-05: qty 1
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
## 2013-01-05 NOTE — ED Notes (Signed)
Patient reports tripped over pans on floor and fell. Complaining of pain to right side of ribs and right wrist. Reports heard crack in wrist when she fell.

## 2013-01-07 NOTE — ED Provider Notes (Signed)
Medical screening examination/treatment/procedure(s) were performed by non-physician practitioner and as supervising physician I was immediately available for consultation/collaboration.   Joya Gaskins, MD 01/07/13 [phone]

## 2013-01-08 ENCOUNTER — Emergency Department (HOSPITAL_COMMUNITY)
Admission: EM | Admit: 2013-01-08 | Discharge: 2013-01-08 | Disposition: A | Discharge status: Home / Self Care | Payer: Medicaid Other | Attending: Emergency Medicine | Admitting: Emergency Medicine

## 2013-01-08 ENCOUNTER — Emergency Department (HOSPITAL_COMMUNITY): Payer: Medicaid Other

## 2013-01-08 ENCOUNTER — Encounter (HOSPITAL_COMMUNITY): Payer: Self-pay

## 2013-01-08 DIAGNOSIS — F411 Generalized anxiety disorder: Secondary | ICD-10-CM | POA: Insufficient documentation

## 2013-01-08 DIAGNOSIS — Z8742 Personal history of other diseases of the female genital tract: Secondary | ICD-10-CM | POA: Insufficient documentation

## 2013-01-08 DIAGNOSIS — IMO0002 Reserved for concepts with insufficient information to code with codable children: Secondary | ICD-10-CM | POA: Insufficient documentation

## 2013-01-08 DIAGNOSIS — Y92410 Unspecified street and highway as the place of occurrence of the external cause: Secondary | ICD-10-CM | POA: Insufficient documentation

## 2013-01-08 DIAGNOSIS — T07XXXA Unspecified multiple injuries, initial encounter: Secondary | ICD-10-CM

## 2013-01-08 DIAGNOSIS — Z87891 Personal history of nicotine dependence: Secondary | ICD-10-CM | POA: Insufficient documentation

## 2013-01-08 DIAGNOSIS — Y9389 Activity, other specified: Secondary | ICD-10-CM | POA: Insufficient documentation

## 2013-01-08 DIAGNOSIS — Z79899 Other long term (current) drug therapy: Secondary | ICD-10-CM | POA: Insufficient documentation

## 2013-01-08 HISTORY — DX: Anxiety disorder, unspecified: F41.9

## 2013-01-08 MED ORDER — OXYCODONE-ACETAMINOPHEN 5-325 MG PO TABS
2.0000 | ORAL_TABLET | Freq: Once | ORAL | Status: AC
  Administered 2013-01-08: 2 via ORAL
  Filled 2013-01-08: qty 2
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
## 2013-01-25 ENCOUNTER — Emergency Department (HOSPITAL_COMMUNITY)
Admission: EM | Admit: 2013-01-25 | Discharge: 2013-01-25 | Disposition: A | Discharge status: Home / Self Care | Payer: Medicaid Other | Attending: Emergency Medicine | Admitting: Emergency Medicine

## 2013-01-25 ENCOUNTER — Emergency Department (HOSPITAL_COMMUNITY): Payer: Medicaid Other

## 2013-01-25 ENCOUNTER — Encounter (HOSPITAL_COMMUNITY): Payer: Self-pay | Admitting: Emergency Medicine

## 2013-01-25 DIAGNOSIS — S63509A Unspecified sprain of unspecified wrist, initial encounter: Secondary | ICD-10-CM | POA: Insufficient documentation

## 2013-01-25 DIAGNOSIS — Y92009 Unspecified place in unspecified non-institutional (private) residence as the place of occurrence of the external cause: Secondary | ICD-10-CM | POA: Insufficient documentation

## 2013-01-25 DIAGNOSIS — S93409A Sprain of unspecified ligament of unspecified ankle, initial encounter: Secondary | ICD-10-CM | POA: Insufficient documentation

## 2013-01-25 DIAGNOSIS — S93401A Sprain of unspecified ligament of right ankle, initial encounter: Secondary | ICD-10-CM

## 2013-01-25 DIAGNOSIS — Z79899 Other long term (current) drug therapy: Secondary | ICD-10-CM | POA: Insufficient documentation

## 2013-01-25 DIAGNOSIS — S99929A Unspecified injury of unspecified foot, initial encounter: Secondary | ICD-10-CM | POA: Insufficient documentation

## 2013-01-25 DIAGNOSIS — Z8742 Personal history of other diseases of the female genital tract: Secondary | ICD-10-CM | POA: Insufficient documentation

## 2013-01-25 DIAGNOSIS — Z87891 Personal history of nicotine dependence: Secondary | ICD-10-CM | POA: Insufficient documentation

## 2013-01-25 DIAGNOSIS — S63501A Unspecified sprain of right wrist, initial encounter: Secondary | ICD-10-CM

## 2013-01-25 DIAGNOSIS — Y9389 Activity, other specified: Secondary | ICD-10-CM | POA: Insufficient documentation

## 2013-01-25 DIAGNOSIS — W172XXA Fall into hole, initial encounter: Secondary | ICD-10-CM | POA: Insufficient documentation

## 2013-01-25 DIAGNOSIS — S8990XA Unspecified injury of unspecified lower leg, initial encounter: Secondary | ICD-10-CM | POA: Insufficient documentation

## 2013-01-25 DIAGNOSIS — F411 Generalized anxiety disorder: Secondary | ICD-10-CM | POA: Insufficient documentation

## 2013-01-25 MED ORDER — HYDROCODONE-ACETAMINOPHEN 5-325 MG PO TABS
1.0000 | ORAL_TABLET | Freq: Once | ORAL | Status: AC
  Administered 2013-01-25: 1 via ORAL
  Filled 2013-01-25: qty 1

## 2013-01-25 MED ORDER — HYDROCODONE-ACETAMINOPHEN 5-325 MG PO TABS: ORAL_TABLET | ORAL | Status: DC

## 2013-01-25 NOTE — ED Notes (Signed)
Pt fell in a hole and no c/o right wrist pain and right ankle pain.

## 2013-01-25 NOTE — ED Notes (Signed)
Pt c/o right ankle and wrist pain after she tripped and fell earlier today. No swelling or deformity noted. Pt able to move both extremities. Pt ambulatory on arrival.

## 2013-01-25 NOTE — ED Provider Notes (Signed)
History     CSN: [id number]  Arrival date & time 01/25/13  4098   First MD Initiated Contact with Patient 01/25/13 1937      Chief Complaint  Patient presents with  . Wrist Pain  . Ankle Pain    (Consider location/radiation/quality/duration/timing/severity/associated sxs/prior treatment) HPI Comments: Valerie Robinson is a 27 y.o. female who presents to the Emergency Department complaining of right ankle and wrist pain after stepping in a hole in the yard several hrs PTA.  C/o pain and swelling ot the wrist.  Pain to the ankle with attempting to bear weight.  She denies numbness or weakness of the extremities.    The history is provided by the patient.    Past Medical History  Diagnosis Date  . Ovarian cyst   . Anxiety     Past Surgical History  Procedure Laterality Date  . Cesarean section      History reviewed. No pertinent family history.  History  Substance Use Topics  . Smoking status: Former Smoker -- 0.50 packs/day    Types: Cigarettes  . Smokeless tobacco: Not on file  . Alcohol Use: No    OB History   Grav Para Term Preterm Abortions TAB SAB Ect Mult Living   2 2 2       2       Review of Systems  Constitutional: Negative for fever and chills.  Genitourinary: Negative for dysuria and difficulty urinating.  Musculoskeletal: Positive for joint swelling and arthralgias. Negative for back pain and gait problem.  Skin: Negative for color change and wound.  All other systems reviewed and are negative.    Allergies  Naprosyn  Home Medications   Current Outpatient Rx  Name  Route  Sig  Dispense  Refill  . ALPRAZolam (XANAX) 0.5 MG tablet   Oral   Take 0.5 mg by mouth 3 (three) times daily.          . medroxyPROGESTERone (DEPO-PROVERA) 150 MG/ML injection   Intramuscular   Inject 150 mg into the muscle every 3 (three) months.           BP 118/71  Pulse 97  Temp(Src) 98 F (36.7 C) (Oral)  Resp 24  Ht 5\' 6"  (1.676 m)  Wt 210 lb (95.255  kg)  BMI 33.91 kg/m2  SpO2 100%  Physical Exam  Nursing note and vitals reviewed. Constitutional: She is oriented to person, place, and time. She appears well-developed and well-nourished. No distress.  HENT:  Head: Normocephalic and atraumatic.  Neck: Normal range of motion. Neck supple.  Cardiovascular: Normal rate, regular rhythm, normal heart sounds and intact distal pulses.   Pulmonary/Chest: Effort normal and breath sounds normal. No respiratory distress.  Musculoskeletal: She exhibits tenderness. She exhibits no edema.       Right wrist: She exhibits tenderness, bony tenderness and swelling. She exhibits normal range of motion, no effusion, no crepitus, no deformity and no laceration.       Right ankle: She exhibits normal range of motion, no swelling, no ecchymosis, no deformity, no laceration and normal pulse. Tenderness. Lateral malleolus tenderness found. No posterior TFL, no head of 5th metatarsal and no proximal fibula tenderness found. Achilles tendon normal.       Arms:      Feet:  ttp of the distal right wristt. Mild STS present.   Radial pulse is brisk, sensation intact.  CR< 2 sec.  No bruising or bony deformity.  Patient has full ROM.  Patient also  has ttp of the lateral right ankle.  No bruising, bony deformity or proximal tenderness.  DP pulse is brisk, distal sensation intact  Lymphadenopathy:    She has no cervical adenopathy.  Neurological: She is alert and oriented to person, place, and time. She exhibits normal muscle tone. Coordination normal.  Skin: Skin is warm and dry.    ED Course  Procedures (including critical care time)  Labs Reviewed - No data to display Dg Wrist Complete Right  01/25/2013   *RADIOLOGY REPORT*  Clinical Data: Fall.  Right wrist pain.  RIGHT WRIST - COMPLETE 3+ VIEW  Comparison: 09/24/2011.  Findings: Anatomic alignment bones of the right wrist.  There is no fracture.  The soft tissues are within normal limits.  The scaphoid bone is  intact.  IMPRESSION: Negative.   Original Report Authenticated By: Andreas Newport, M.D.   Dg Ankle Complete Right  01/25/2013   *RADIOLOGY REPORT*  Clinical Data: Fall.  Twisted ankle.  RIGHT ANKLE - COMPLETE 3+ VIEW  Comparison: None.  Findings: Anatomic alignment of the right ankle.  Prominent talar ridge.  No fracture.  No effusion.  IMPRESSION: Negative.   Original Report Authenticated By: Andreas Newport, M.D.      velcro wrist splint and ASO applied.  Pain improved, remains NV intact     MDM   Pt agrees to RICE therapy and close f/u with orthopedics.  x-ray findings discussed with pt       Malcolm Quast L. Trisha Mangle, PA-C 01/29/13 1908

## 2013-01-30 NOTE — ED Provider Notes (Signed)
Medical screening examination/treatment/procedure(s) were performed by non-physician practitioner and as supervising physician I was immediately available for consultation/collaboration.  Serjio Deupree, MD 01/30/13 1133 

## 2013-02-12 ENCOUNTER — Emergency Department (HOSPITAL_COMMUNITY): Payer: Medicaid Other

## 2013-02-12 ENCOUNTER — Emergency Department (HOSPITAL_COMMUNITY)
Admission: EM | Admit: 2013-02-12 | Discharge: 2013-02-12 | Disposition: A | Discharge status: Home / Self Care | Payer: Medicaid Other | Attending: Emergency Medicine | Admitting: Emergency Medicine

## 2013-02-12 ENCOUNTER — Encounter (HOSPITAL_COMMUNITY): Payer: Self-pay | Admitting: *Deleted

## 2013-02-12 DIAGNOSIS — S40012A Contusion of left shoulder, initial encounter: Secondary | ICD-10-CM

## 2013-02-12 DIAGNOSIS — F411 Generalized anxiety disorder: Secondary | ICD-10-CM | POA: Insufficient documentation

## 2013-02-12 DIAGNOSIS — Z8742 Personal history of other diseases of the female genital tract: Secondary | ICD-10-CM | POA: Insufficient documentation

## 2013-02-12 DIAGNOSIS — Y9389 Activity, other specified: Secondary | ICD-10-CM | POA: Insufficient documentation

## 2013-02-12 DIAGNOSIS — R55 Syncope and collapse: Secondary | ICD-10-CM | POA: Insufficient documentation

## 2013-02-12 DIAGNOSIS — Z79899 Other long term (current) drug therapy: Secondary | ICD-10-CM | POA: Insufficient documentation

## 2013-02-12 DIAGNOSIS — S40019A Contusion of unspecified shoulder, initial encounter: Secondary | ICD-10-CM | POA: Insufficient documentation

## 2013-02-12 DIAGNOSIS — W1809XA Striking against other object with subsequent fall, initial encounter: Secondary | ICD-10-CM | POA: Insufficient documentation

## 2013-02-12 DIAGNOSIS — Y9289 Other specified places as the place of occurrence of the external cause: Secondary | ICD-10-CM | POA: Insufficient documentation

## 2013-02-12 DIAGNOSIS — W19XXXA Unspecified fall, initial encounter: Secondary | ICD-10-CM

## 2013-02-12 DIAGNOSIS — Z87891 Personal history of nicotine dependence: Secondary | ICD-10-CM | POA: Insufficient documentation

## 2013-02-12 LAB — POCT I-STAT, CHEM 8
Hemoglobin: 13.6 g/dL (ref 12.0–15.0)
Sodium: 141 mEq/L (ref 135–145)
TCO2: 22 mmol/L (ref 0–100)

## 2013-02-12 MED ORDER — METHOCARBAMOL 500 MG PO TABS: ORAL_TABLET | ORAL | Status: DC

## 2013-02-12 MED ORDER — CYCLOBENZAPRINE HCL 10 MG PO TABS
10.0000 mg | ORAL_TABLET | Freq: Once | ORAL | Status: AC
  Administered 2013-02-12: 10 mg via ORAL
  Filled 2013-02-12: qty 1

## 2013-02-12 MED ORDER — TRAMADOL HCL 50 MG PO TABS
100.0000 mg | ORAL_TABLET | Freq: Once | ORAL | Status: AC
  Administered 2013-02-12: 100 mg via ORAL
  Filled 2013-02-12: qty 2

## 2013-02-12 MED ORDER — TRAMADOL HCL 50 MG PO TABS: 100.0000 mg | ORAL_TABLET | Freq: Four times a day (QID) | ORAL | Status: DC | PRN

## 2013-02-12 MED ORDER — ACETAMINOPHEN 500 MG PO TABS
1000.0000 mg | ORAL_TABLET | Freq: Once | ORAL | Status: AC
  Administered 2013-02-12: 1000 mg via ORAL
  Filled 2013-02-12: qty 2

## 2013-02-12 NOTE — ED Notes (Addendum)
Pt was at her father in law's funeral yesterday when she started to feel "faint", pt states that she was standing at the funeral when she felt weak, fell to the floor, hitting her left shoulder against the floor, remembers falling. Denies any dizziness or pain, presents to er today with c/o left shoulder and neck pain. Pt states that it is painful to raise her left shoulder up. Good cap refill noted, positive distal pulse.

## 2013-02-12 NOTE — ED Notes (Signed)
Pt with left shoulder pain that radiates into left neck since fall yesterday, states that she passed out

## 2013-02-12 NOTE — ED Provider Notes (Signed)
History    This chart was scribed for Ward Givens, MD by Leone Payor, ED Scribe. This patient was seen in room APA07/APA07 and the patient's care was started 2:17 PM.   CSN: [id number]  Arrival date & time 02/12/13  1337   First MD Initiated Contact with Patient 02/12/13 1408      Chief Complaint  Patient presents with  . Shoulder Pain  . Near Syncope     The history is provided by the patient. No language interpreter was used.    HPI Comments: Valerie Robinson is a 27 y.o. female who presents to the Emergency Department complaining of a syncopal episode that occurred yesterday. States she was at a funeral for her boyfriends father who died unexpectedly, where she was standing for a long time. She was walking to go sit down when she had the near-syncopal episode. States she remembers falling and was holding her daughter in her right arm and fell onto her left shoulder. She does not recall hitting her head. States she could hear what was doing on around her the whole time. She has been feeling weak but denies nausea. States she has been feeling more tired lately. She has not been eating well in the past few days. States last night she was so fatigued she went to bed at 8 pm.  Denies h/o syncope. She hurt her L shoulder when she fell and the pain radiates to neck. She states turning her head from left to right makes the pain worse as does the abducting her left arm. Denies abdominal pain.  Maternal Grandmother has h/o ? heart disease.   PCP Dr Shea Evans, FP at [facility]    Past Medical History  Diagnosis Date  . Ovarian cyst   . Anxiety     Past Surgical History  Procedure Laterality Date  . Cesarean section      No family history on file.  History  Substance Use Topics  . Smoking status: Former Smoker -- 0.50 packs/day    Types: Cigarettes  . Smokeless tobacco: Not on file  . Alcohol Use: No  unemployed  Lives with boyfriend   OB History   Grav Para Term Preterm Abortions TAB  SAB Ect Mult Living   2 2 2       2       Review of Systems  Gastrointestinal: Negative for nausea and abdominal pain.  Musculoskeletal: Positive for arthralgias (L shoulder).  Neurological: Positive for syncope.  All other systems reviewed and are negative.    Allergies  Naprosyn  Home Medications   Current Outpatient Rx  Name  Route  Sig  Dispense  Refill  . ALPRAZolam (XANAX) 0.5 MG tablet   Oral   Take 0.5 mg by mouth 3 (three) times daily as needed for anxiety.          Marland Kitchen HYDROcodone-acetaminophen (NORCO/VICODIN) 5-325 MG per tablet   Oral   Take 1 tablet by mouth every 4 (four) hours as needed for pain.         . medroxyPROGESTERone (DEPO-PROVERA) 150 MG/ML injection   Intramuscular   Inject 150 mg into the muscle every 3 (three) months.           BP 123/79  Pulse 88  Temp(Src) 98.9 F (37.2 C) (Oral)  Resp 20  Ht 5\' 6"  (1.676 m)  Wt 215 lb (97.523 kg)  BMI 34.72 kg/m2  SpO2 100%  Vital signs normal    Physical Exam  Nursing  note and vitals reviewed. Constitutional: She is oriented to person, place, and time. She appears well-developed and well-nourished.  Non-toxic appearance. She does not appear ill. No distress.  HENT:  Head: Normocephalic and atraumatic.  Right Ear: External ear normal.  Left Ear: External ear normal.  Nose: Nose normal. No mucosal edema or rhinorrhea.  Mouth/Throat: Oropharynx is clear and moist and mucous membranes are normal. No dental abscesses or edematous.  Eyes: Conjunctivae and EOM are normal. Pupils are equal, round, and reactive to light.  Neck: Normal range of motion and full passive range of motion without pain. Neck supple.    Cardiovascular: Normal rate, regular rhythm and normal heart sounds.  Exam reveals no gallop and no friction rub.   No murmur heard. Pulmonary/Chest: Effort normal and breath sounds normal. No respiratory distress. She has no wheezes. She has no rhonchi. She has no rales. She exhibits no  tenderness and no crepitus.  Abdominal: Soft. Normal appearance and bowel sounds are normal. She exhibits no distension. There is no tenderness. There is no rebound and no guarding.  Musculoskeletal: Normal range of motion. She exhibits tenderness. She exhibits no edema.  Moves all extremities well. Non tender midline Cervical spine. Very tender over proximal L trapezius. Less tender in the superior portion of the left ACJ, does have pain with abduction of her left arm at 90 degrees.   Neurological: She is alert and oriented to person, place, and time. She has normal strength. No cranial nerve deficit.  Skin: Skin is warm, dry and intact. No rash noted. No erythema. No pallor.  Psychiatric: She has a normal mood and affect. Her speech is normal and behavior is normal. Her mood appears not anxious.  Flat affect    ED Course  Procedures (including critical care time) Medications  cyclobenzaprine (FLEXERIL) tablet 10 mg (10 mg Oral Given 02/12/13 1445)  traMADol (ULTRAM) tablet 100 mg (100 mg Oral Given 02/12/13 1445)  acetaminophen (TYLENOL) tablet 1,000 mg (1,000 mg Oral Given 02/12/13 1445)     DIAGNOSTIC STUDIES: Oxygen Saturation is 100% on RA, normal by my interpretation.    COORDINATION OF CARE: 2:36 PM Discussed treatment plan with pt at bedside and pt agreed to plan.   3:53 PM Pt is updated on imaging results. States her pain is alittle better, refuses a sling, wants local ortho referral.   Results for orders placed during the hospital encounter of 02/12/13  POCT I-STAT, CHEM 8      Result Value Range   Sodium 141  135 - 145 mEq/L   Potassium 3.5  3.5 - 5.1 mEq/L   Chloride 107  96 - 112 mEq/L   BUN 5 (*) 6 - 23 mg/dL   Creatinine, Ser 8.46  0.50 - 1.10 mg/dL   Glucose, Bld 962 (*) 70 - 99 mg/dL   Calcium, Ion 9.52  8.41 - 1.23 mmol/L   TCO2 22  0 - 100 mmol/L   Hemoglobin 13.6  12.0 - 15.0 g/dL   HCT 32.4  40.1 - 02.7 %    Laboratory interpretation all normal   Dg  Shoulder Left  02/12/2013   *RADIOLOGY REPORT*  Clinical Data: Shoulder pain, fall  LEFT SHOULDER - 2+ VIEW  Comparison: None.  Findings: Glenohumeral joint is intact.  No evidence of scapular fracture  or humeral fracture.  The acromioclavicular joint is intact.  IMPRESSION: No acute osseous abnormality.   Original Report Authenticated By: Genevive Bi, M.D.    Dg Wrist Complete Right  01/25/2013   IMPRESSION: Negative.   Original Report Authenticated By: Andreas Newport, M.D.   Dg Ankle Complete Right  01/25/2013  n.  IMPRESSION: Negative.   Original Report Authenticated By: Andreas Newport, M.D.    Date: 02/12/2013  Rate: 81  Rhythm: normal sinus rhythm  QRS Axis: right  Intervals: normal  ST/T Wave abnormalities: normal  Conduction Disutrbances:right bundle branch block  Narrative Interpretation:   Old EKG Reviewed: none available     1. Near syncope   2. Fall (on)(from) incline, initial encounter   3. Contusion of shoulder, left, initial encounter    Discharge Medication List as of 02/12/2013  4:06 PM    START taking these medications   Details  methocarbamol (ROBAXIN) 500 MG tablet Take 1 or 2 po Q 6hrs for muscle soreness, Print    traMADol (ULTRAM) 50 MG tablet Take 2 tablets (100 mg total) by mouth every 6 (six) hours as needed., Starting 02/12/2013, Until Discontinued, Print        Plan discharge   Devoria Albe, MD, FACEP    MDM    I personally performed the services described in this documentation, which was scribed in my presence. The recorded information has been reviewed and considered.  Devoria Albe, MD, Armando Gang   Ward Givens, MD 02/12/13 [phone]

## 2013-02-12 NOTE — ED Notes (Signed)
MD at bedside. 

## 2013-03-01 ENCOUNTER — Encounter (HOSPITAL_COMMUNITY): Payer: Self-pay

## 2013-03-01 ENCOUNTER — Emergency Department (HOSPITAL_COMMUNITY): Payer: Medicaid Other

## 2013-03-01 ENCOUNTER — Inpatient Hospital Stay (HOSPITAL_COMMUNITY)
Admission: EM | Admit: 2013-03-01 | Discharge: 2013-03-03 | DRG: 419 | Disposition: A | Discharge status: Home / Self Care | Payer: Medicaid Other | Attending: General Surgery | Admitting: General Surgery

## 2013-03-01 DIAGNOSIS — K8020 Calculus of gallbladder without cholecystitis without obstruction: Secondary | ICD-10-CM

## 2013-03-01 DIAGNOSIS — Z87891 Personal history of nicotine dependence: Secondary | ICD-10-CM

## 2013-03-01 DIAGNOSIS — K8000 Calculus of gallbladder with acute cholecystitis without obstruction: Principal | ICD-10-CM | POA: Diagnosis present

## 2013-03-01 DIAGNOSIS — F411 Generalized anxiety disorder: Secondary | ICD-10-CM | POA: Diagnosis present

## 2013-03-01 DIAGNOSIS — D72829 Elevated white blood cell count, unspecified: Secondary | ICD-10-CM | POA: Diagnosis present

## 2013-03-01 HISTORY — DX: Bradycardia, unspecified: R00.1

## 2013-03-01 LAB — COMPREHENSIVE METABOLIC PANEL
ALT: 24 U/L (ref 0–35)
BUN: 10 mg/dL (ref 6–23)
CO2: 26 mEq/L (ref 19–32)
Calcium: 10 mg/dL (ref 8.4–10.5)
Creatinine, Ser: 0.69 mg/dL (ref 0.50–1.10)
GFR calc Af Amer: >90 mL/min (ref >90)
GFR calc non Af Amer: >90 mL/min (ref >90)
Glucose, Bld: 107 mg/dL — ABNORMAL HIGH (ref 70–99)
Total Protein: 7.9 g/dL (ref 6.0–8.3)

## 2013-03-01 LAB — CBC WITH DIFFERENTIAL/PLATELET
Basophils Absolute: 0 10*3/uL (ref 0.0–0.1)
Basophils Relative: 0 % (ref 0–1)
Eosinophils Absolute: 0 10*3/uL (ref 0.0–0.7)
HCT: 42.9 % (ref 36.0–46.0)
Hemoglobin: 15 g/dL (ref 12.0–15.0)
Lymphocytes Relative: 7 % — ABNORMAL LOW (ref 12–46)
MCHC: 35 g/dL (ref 30.0–36.0)
Monocytes Relative: 3 % (ref 3–12)
Neutrophils Relative %: 90 % — ABNORMAL HIGH (ref 43–77)
WBC: 19.9 10*3/uL — ABNORMAL HIGH (ref 4.0–10.5)

## 2013-03-01 LAB — LIPASE, BLOOD: Lipase: 26 U/L (ref 11–59)

## 2013-03-01 MED ORDER — ENOXAPARIN SODIUM 40 MG/0.4ML ~~LOC~~ SOLN
40.0000 mg | SUBCUTANEOUS | Status: DC
  Administered 2013-03-01 – 2013-03-02 (×2): 40 mg via SUBCUTANEOUS
  Filled 2013-03-01 (×2): qty 0.4

## 2013-03-01 MED ORDER — SODIUM CHLORIDE 0.9 % IV BOLUS (SEPSIS)
1000.0000 mL | Freq: Once | INTRAVENOUS | Status: AC
  Administered 2013-03-01: 1000 mL via INTRAVENOUS

## 2013-03-01 MED ORDER — NICOTINE 21 MG/24HR TD PT24
21.0000 mg | MEDICATED_PATCH | Freq: Every day | TRANSDERMAL | Status: DC
  Administered 2013-03-01 – 2013-03-03 (×3): 21 mg via TRANSDERMAL
  Filled 2013-03-01 (×3): qty 1

## 2013-03-01 MED ORDER — DIPHENHYDRAMINE HCL 12.5 MG/5ML PO ELIX
12.5000 mg | ORAL_SOLUTION | Freq: Four times a day (QID) | ORAL | Status: DC | PRN
  Administered 2013-03-03: 25 mg via ORAL
  Filled 2013-03-01: qty 10

## 2013-03-01 MED ORDER — HYDROMORPHONE HCL PF 1 MG/ML IJ SOLN
1.0000 mg | Freq: Once | INTRAMUSCULAR | Status: AC
  Administered 2013-03-01: 1 mg via INTRAVENOUS
  Filled 2013-03-01: qty 1

## 2013-03-01 MED ORDER — ACETAMINOPHEN 325 MG PO TABS: 650.0000 mg | ORAL_TABLET | Freq: Four times a day (QID) | ORAL | Status: DC | PRN

## 2013-03-01 MED ORDER — SODIUM CHLORIDE 0.9 % IV SOLN
1.5000 g | Freq: Four times a day (QID) | INTRAVENOUS | Status: DC
  Administered 2013-03-01 – 2013-03-03 (×7): 1.5 g via INTRAVENOUS
  Filled 2013-03-01 (×19): qty 1.5

## 2013-03-01 MED ORDER — HYDROMORPHONE HCL PF 1 MG/ML IJ SOLN
1.0000 mg | INTRAMUSCULAR | Status: DC | PRN
  Administered 2013-03-02 – 2013-03-03 (×11): 1 mg via INTRAVENOUS
  Filled 2013-03-01 (×11): qty 1

## 2013-03-01 MED ORDER — PANTOPRAZOLE SODIUM 40 MG IV SOLR
40.0000 mg | Freq: Every day | INTRAVENOUS | Status: DC
  Administered 2013-03-01 – 2013-03-02 (×2): 40 mg via INTRAVENOUS
  Filled 2013-03-01 (×2): qty 40

## 2013-03-01 MED ORDER — ACETAMINOPHEN 650 MG RE SUPP: 650.0000 mg | Freq: Four times a day (QID) | RECTAL | Status: DC | PRN

## 2013-03-01 MED ORDER — ONDANSETRON HCL 4 MG/2ML IJ SOLN
4.0000 mg | Freq: Once | INTRAMUSCULAR | Status: AC
  Administered 2013-03-01: 4 mg via INTRAVENOUS
  Filled 2013-03-01: qty 2

## 2013-03-01 MED ORDER — SODIUM CHLORIDE 0.9 % IV SOLN
INTRAVENOUS | Status: AC
  Filled 2013-03-01 (×2): qty 1.5

## 2013-03-01 MED ORDER — ONDANSETRON HCL 4 MG/2ML IJ SOLN
4.0000 mg | Freq: Four times a day (QID) | INTRAMUSCULAR | Status: DC | PRN
  Administered 2013-03-02 – 2013-03-03 (×4): 4 mg via INTRAVENOUS
  Filled 2013-03-01 (×4): qty 2

## 2013-03-01 MED ORDER — DIPHENHYDRAMINE HCL 50 MG/ML IJ SOLN: 12.5000 mg | Freq: Four times a day (QID) | INTRAMUSCULAR | Status: DC | PRN

## 2013-03-01 MED ORDER — GADOBENATE DIMEGLUMINE 529 MG/ML IV SOLN
20.0000 mL | Freq: Once | INTRAVENOUS | Status: AC | PRN
  Administered 2013-03-01: 20 mL via INTRAVENOUS

## 2013-03-01 MED ORDER — KCL IN DEXTROSE-NACL 20-5-0.45 MEQ/L-%-% IV SOLN
INTRAVENOUS | Status: DC
  Administered 2013-03-01 – 2013-03-03 (×4): via INTRAVENOUS

## 2013-03-01 NOTE — ED Provider Notes (Addendum)
History  This chart was scribed for Donnetta Hutching, MD by Ardelia Mems, ED Scribe. This patient was seen in room APA01/APA01 and the patient's care was started at 4:51 PM.  CSN: [id number]  Arrival date & time 03/01/13  1508   Chief Complaint  Patient presents with  . Abdominal Pain  . Flank Pain    The history is provided by the patient. No language interpreter was used.   HPI Comments: Valerie Robinson is a 27 y.o. female with a hx of ovarian cyst who presents to the Emergency Department complaining of constant, sharp, non-radiating, right anterior lateral abdominal pain that woke her from her sleep this morning. Pt reports associated nausea and vomiting after meals today. Pt denies any hx of similar pain. Pt is unsure of LNMP due to Depo-Provera implant. Pt states that her mother has a hx of cholecystectomy. Pt states that her only abdominal surgical history is a C-section. Pt denies dysuria, hematuria, vaginal discharge, back pain, diarrhea or any other symptoms. Pt denies alcohol use and is a former smoker.  PCP- Dr. Lacey Jensen  Past Medical History  Diagnosis Date  . Ovarian cyst   . Anxiety   . Bradycardia     pt says was seen at [facility] for bradycardia but was never put on any medication for it.   Past Surgical History  Procedure Laterality Date  . Cesarean section     No family history on file. History  Substance Use Topics  . Smoking status: Former Smoker -- 0.50 packs/day    Types: Cigarettes  . Smokeless tobacco: Not on file  . Alcohol Use: No   OB History   Grav Para Term Preterm Abortions TAB SAB Ect Mult Living   2 2 2       2      Review of Systems  Constitutional: Negative for fever and chills.  HENT: Negative for congestion, sore throat, rhinorrhea and neck pain.   Eyes: Negative for visual disturbance.  Respiratory: Negative for cough and shortness of breath.   Cardiovascular: Negative for chest pain.  Gastrointestinal: Positive for nausea, vomiting and  abdominal pain. Negative for diarrhea.  Genitourinary: Negative for dysuria, hematuria and vaginal discharge.  Musculoskeletal: Negative for back pain.  Neurological: Negative for headaches.  Psychiatric/Behavioral: Negative for confusion.   A complete 10 system review of systems was obtained and all systems are negative except as noted in the HPI and PMH.   Allergies  Naprosyn  Home Medications   Current Outpatient Rx  Name  Route  Sig  Dispense  Refill  . acetaminophen (TYLENOL) 500 MG tablet   Oral   Take 1,000 mg by mouth every 6 (six) hours as needed for pain.         . medroxyPROGESTERone (DEPO-PROVERA) 150 MG/ML injection   Intramuscular   Inject 150 mg into the muscle every 3 (three) months.          Triage Vitals: BP 108/45  Temp(Src) 98.6 F (37 C) (Oral)  Resp 20  Ht 5\' 6"  (1.676 m)  Wt 225 lb (102.059 kg)  BMI 36.33 kg/m2  SpO2 100%  Physical Exam  Nursing note and vitals reviewed. Constitutional: She is oriented to person, place, and time. She appears well-developed and well-nourished.  HENT:  Head: Normocephalic and atraumatic.  Eyes: Conjunctivae and EOM are normal. Pupils are equal, round, and reactive to light.  Neck: Normal range of motion. Neck supple.  Cardiovascular: Normal rate, regular rhythm and normal heart  sounds.   Pulmonary/Chest: Effort normal and breath sounds normal.  Abdominal: Soft. Bowel sounds are normal.  Obese. Tenderness in RUQ.  Musculoskeletal: Normal range of motion.  Neurological: She is alert and oriented to person, place, and time.  Skin: Skin is warm and dry.  Psychiatric: She has a normal mood and affect.    ED Course  Procedures (including critical care time)  DIAGNOSTIC STUDIES: Oxygen Saturation is 100% on RA, normal by my interpretation.    COORDINATION OF CARE: 5:00 PM- Pt advised of plan for pain management, along with diagnostic lab work and radiology and pt agrees.  Medications  sodium chloride 0.9 %  bolus 1,000 mL (0 mLs Intravenous Stopped 03/01/13 2005)  HYDROmorphone (DILAUDID) injection 1 mg (1 mg Intravenous Given 03/01/13 1728)  ondansetron (ZOFRAN) injection 4 mg (4 mg Intravenous Given 03/01/13 1726)  HYDROmorphone (DILAUDID) injection 1 mg (1 mg Intravenous Given 03/01/13 1924)  ondansetron (ZOFRAN) injection 4 mg (4 mg Intravenous Given 03/01/13 1923)    Labs Reviewed  COMPREHENSIVE METABOLIC PANEL - Abnormal; Notable for the following:    Glucose, Bld 107 (*)    All other components within normal limits  CBC WITH DIFFERENTIAL - Abnormal; Notable for the following:    WBC 19.9 (*)    Neutrophils Relative % 90 (*)    Lymphocytes Relative 7 (*)    Neutro Abs 17.9 (*)    All other components within normal limits  LIPASE, BLOOD    US Abdomen Limited Ruq  03/01/2013   *RADIOLOGY REPORT*  Clinical Data:  Right upper quadrant pain  LIMITED ABDOMINAL ULTRASOUND - RIGHT UPPER QUADRANT  Comparison:  None.  Findings:  Gallbladder:  Mobile echogenic focus in the gallbladder neck demonstrates posterior acoustic shadowing consistent with cholelithiasis.  The stone measures 1.7 cm in diameter.  No pericholecystic fluid or gallbladder wall thickening.  Per the sonographer, as sonographic Murphy's sign was negative.  Common bile duct:  The common bile duct is borderline enlarged at 8 mm in the region the pancreatic head.  No definite choledocholithiasis seen within the visualized portion of the common bile duct.  Liver:  Unremarkable appearance the hepatic parenchyma.  Normal echogenicity.  No focal lesion identified.  IMPRESSION:  1.  Cholelithiasis without secondary sonographic findings to suggest acute cholecystitis.  2.  Borderline enlargement of the common bile duct to 8 mm in the region of the pancreatic head.  No definite choledocholithiasis or distal obstructing mass.  Recommend clinical correlation with serum bilirubin and LFTs.  If additional imaging is clinically warranted, consider MRCP.                     Original Report Authenticated By: Malachy Moan, M.D.    No diagnosis found.  MDM  Ultrasound shows a 1.5 cm stone in the gallbladder neck.  MRI confirms same.    White count 19K.  liver functions normal.  Will consult general surgery.        I personally performed the services described in this documentation, which was scribed in my presence. The recorded information has been reviewed and is accurate.    Donnetta Hutching, MD 03/01/13 2128  Donnetta Hutching, MD 03/01/13 2129

## 2013-03-01 NOTE — ED Notes (Signed)
Pt c/o ruq pain radiating around to back since 2 am.  Reports n/v.  Denies diarrhea.  Denies difficulty urinating.  Pt says tried to eat but she vomited and eating and drinking makes the pain worse.

## 2013-03-01 NOTE — H&P (Signed)
Valerie Robinson is an 27 y.o. female.   Chief Complaint: Right upper quadrant abdominal pain HPI: Patient is a 27 year old white female who woke up earlier this morning with right upper quadrant abdominal pain, nausea, and vomiting. She presented the emergency room and subsequently was found to have a gallstone in the neck of the gallbladder. She did have a mildly dilated common bile duct. MRCP was performed which revealed no choledocholithiasis. The patient does have leukocytosis is being admitted for further evaluation treatment.  Past Medical History  Diagnosis Date  . Ovarian cyst   . Anxiety   . Bradycardia     pt says was seen at [facility] for bradycardia but was never put on any medication for it.    Past Surgical History  Procedure Laterality Date  . Cesarean section      No family history on file. Social History:  reports that she has quit smoking. Her smoking use included Cigarettes. She smoked 0.50 packs per day. She does not have any smokeless tobacco history on file. She reports that she does not drink alcohol or use illicit drugs.  Allergies:  Allergies  Allergen Reactions  . Naprosyn (Naproxen) Hives, Nausea And Vomiting and Rash     (Not in a hospital admission)  Results for orders placed during the hospital encounter of 03/01/13 (from the past 48 hour(s))  COMPREHENSIVE METABOLIC PANEL     Status: Abnormal   Collection Time    03/01/13  5:26 PM      Result Value Range   Sodium 139  135 - 145 mEq/L   Potassium 3.9  3.5 - 5.1 mEq/L   Chloride 106  96 - 112 mEq/L   CO2 26  19 - 32 mEq/L   Glucose, Bld 107 (*) 70 - 99 mg/dL   BUN 10  6 - 23 mg/dL   Creatinine, Ser 1.61  0.50 - 1.10 mg/dL   Calcium 09.6  8.4 - 04.5 mg/dL   Total Protein 7.9  6.0 - 8.3 g/dL   Albumin 4.4  3.5 - 5.2 g/dL   AST 13  0 - 37 U/L   ALT 24  0 - 35 U/L   Alkaline Phosphatase 81  39 - 117 U/L   Total Bilirubin 0.5  0.3 - 1.2 mg/dL   GFR calc non Af Amer >90  >90 mL/min   GFR calc Af Amer  >90  >90 mL/min   Comment:            The eGFR has been calculated     using the CKD EPI equation.     This calculation has not been     validated in all clinical     situations.     eGFR's persistently     <90 mL/min signify     possible Chronic Kidney Disease.  CBC WITH DIFFERENTIAL     Status: Abnormal   Collection Time    03/01/13  5:26 PM      Result Value Range   WBC 19.9 (*) 4.0 - 10.5 K/uL   RBC 4.65  3.87 - 5.11 MIL/uL   Hemoglobin 15.0  12.0 - 15.0 g/dL   HCT 40.9  81.1 - 91.4 %   MCV 92.3  78.0 - 100.0 fL   MCH 32.3  26.0 - 34.0 pg   MCHC 35.0  30.0 - 36.0 g/dL   RDW 78.2  95.6 - 21.3 %   Platelets 316  150 - 400 K/uL   Neutrophils  Relative % 90 (*) 43 - 77 %   Lymphocytes Relative 7 (*) 12 - 46 %   Monocytes Relative 3  3 - 12 %   Eosinophils Relative 0  0 - 5 %   Basophils Relative 0  0 - 1 %   Neutro Abs 17.9 (*) 1.7 - 7.7 K/uL   Lymphs Abs 1.4  0.7 - 4.0 K/uL   Monocytes Absolute 0.6  0.1 - 1.0 K/uL   Eosinophils Absolute 0.0  0.0 - 0.7 K/uL   Basophils Absolute 0.0  0.0 - 0.1 K/uL   WBC Morphology ATYPICAL LYMPHOCYTES    LIPASE, BLOOD     Status: None   Collection Time    03/01/13  5:26 PM      Result Value Range   Lipase 26  11 - 59 U/L   US Abdomen Limited Ruq  03/01/2013   *RADIOLOGY REPORT*  Clinical Data:  Right upper quadrant pain  LIMITED ABDOMINAL ULTRASOUND - RIGHT UPPER QUADRANT  Comparison:  None.  Findings:  Gallbladder:  Mobile echogenic focus in the gallbladder neck demonstrates posterior acoustic shadowing consistent with cholelithiasis.  The stone measures 1.7 cm in diameter.  No pericholecystic fluid or gallbladder wall thickening.  Per the sonographer, as sonographic Murphy's sign was negative.  Common bile duct:  The common bile duct is borderline enlarged at 8 mm in the region the pancreatic head.  No definite choledocholithiasis seen within the visualized portion of the common bile duct.  Liver:  Unremarkable appearance the hepatic  parenchyma.  Normal echogenicity.  No focal lesion identified.  IMPRESSION:  1.  Cholelithiasis without secondary sonographic findings to suggest acute cholecystitis.  2.  Borderline enlargement of the common bile duct to 8 mm in the region of the pancreatic head.  No definite choledocholithiasis or distal obstructing mass.  Recommend clinical correlation with serum bilirubin and LFTs.  If additional imaging is clinically warranted, consider MRCP.                    Original Report Authenticated By: Malachy Moan, M.D.    Review of Systems  Gastrointestinal: Positive for nausea, vomiting and abdominal pain.  All other systems reviewed and are negative.    Blood pressure 113/61, pulse 43, temperature 98.3 F (36.8 C), temperature source Oral, resp. rate 20, height 5\' 6"  (1.676 m), weight 102.059 kg (225 lb), SpO2 100.00%. Physical Exam  Constitutional: She is oriented to person, place, and time. She appears well-developed and well-nourished.  HENT:  Head: Normocephalic and atraumatic.  Eyes: No scleral icterus.  Neck: Normal range of motion. Neck supple.  Cardiovascular: Normal rate, regular rhythm and normal heart sounds.   Respiratory: Effort normal and breath sounds normal.  GI: Soft. Bowel sounds are normal. She exhibits no distension. There is tenderness.  Tender in the right upper quadrant to palpation.  Neurological: She is alert and oriented to person, place, and time.  Skin: Skin is warm and dry.     Assessment/Plan Impression: Acute cholecystitis, cholelithiasis Plan: The patient remained to the hospital for IV hydration, control her pain, and IV antibiotics. She'll subsequently undergo a laparoscopic cholecystectomy. The risks and benefits of the procedure putting bleeding, infection, hepatobiliary injury, the possibly of an open procedure were fully explained to the patient, who gave informed consent.  Ashvik Grundman A 03/01/2013, 9:48 PM

## 2013-03-01 NOTE — ED Notes (Signed)
Pt c/o left side abdominal pain pain as well as N/V that began this morning. Pt describes pain as sharp.

## 2013-03-02 LAB — HEPATIC FUNCTION PANEL
Albumin: 3.6 g/dL (ref 3.5–5.2)
Indirect Bilirubin: 0.5 mg/dL (ref 0.3–0.9)
Total Bilirubin: 0.7 mg/dL (ref 0.3–1.2)
Total Protein: 6.9 g/dL (ref 6.0–8.3)

## 2013-03-02 LAB — CBC
HCT: 40.3 % (ref 36.0–46.0)
MCH: 32.5 pg (ref 26.0–34.0)
MCHC: 34.7 g/dL (ref 30.0–36.0)
RDW: 12.4 % (ref 11.5–15.5)

## 2013-03-02 LAB — MRSA PCR SCREENING: MRSA by PCR: NEGATIVE

## 2013-03-02 LAB — HCG, SERUM, QUALITATIVE: Preg, Serum: NEGATIVE

## 2013-03-02 LAB — AMYLASE: Amylase: 49 U/L (ref 0–105)

## 2013-03-02 MED ORDER — CHLORHEXIDINE GLUCONATE 4 % EX LIQD
1.0000 "application " | Freq: Once | CUTANEOUS | Status: AC
  Administered 2013-03-02: 1 via TOPICAL
  Filled 2013-03-02: qty 15

## 2013-03-02 NOTE — Progress Notes (Signed)
UR chart review completed.  

## 2013-03-02 NOTE — Progress Notes (Signed)
Subjective: Abdominal pain is less today. No nausea or vomiting.  Objective: Vital signs in last 24 hours: Temp:  [97.6 F (36.4 C)-98.6 F (37 C)] 98.4 F (36.9 C) (07/10 0659) Pulse Rate:  [43-52] 52 (07/10 0659) Resp:  [18-20] 18 (07/10 0659) BP: (100-116)/(45-75) 100/47 mmHg (07/10 0659) SpO2:  [99 %-100 %] 99 % (07/10 0659) Weight:  [94.1 kg (207 lb 7.3 oz)-102.059 kg (225 lb)] 94.1 kg (207 lb 7.3 oz) (07/09 2242) Last BM Date: 03/01/13  Intake/Output from previous day: 07/09 0701 - 07/10 0700 In: 1031.7 [P.O.:240; I.V.:691.7; IV Piggyback:100] Out: -  Intake/Output this shift:    General appearance: alert, cooperative and no distress Resp: clear to auscultation bilaterally Cardio: regular rate and rhythm, S1, S2 normal, no murmur, click, rub or gallop GI: Soft. Less right upper quadrant abdominal pain noted. No rigidity noted.  Lab Results:   Recent Labs  03/01/13 1726 03/02/13 0527  WBC 19.9* 13.0*  HGB 15.0 14.0  HCT 42.9 40.3  PLT 316 270   BMET  Recent Labs  03/01/13 1726  NA 139  K 3.9  CL 106  CO2 26  GLUCOSE 107*  BUN 10  CREATININE 0.69  CALCIUM 10.0   PT/INR No results found for this basename: LABPROT, INR,  in the last 72 hours  Studies/Results: Mr 3d Recon At Scanner  03/02/2013   *RADIOLOGY REPORT*  Clinical Data:  Right-sided abdominal pain.  Biliary ductal dilatation on ultrasound.  MRI ABDOMEN WITHOUT AND WITH CONTRAST (INCLUDING MRCP)  Technique:  Multiplanar multisequence MR imaging of the abdomen was performed both before and after the administration of intravenous contrast. Heavily T2-weighted images of the biliary and pancreatic ducts were obtained, and three-dimensional MRCP images were rendered by post processing.  Contrast: 20mL MULTIHANCE GADOBENATE DIMEGLUMINE 529 MG/ML IV SOLN  Comparison:  Ultrasound of same date  Findings:  Normal aortic caliber without dissection.  Tiny hiatal hernia.  No focal liver lesion.  Normal  spleen, distal stomach, pancreas. No pancreatic ductal dilatation.  There is mild intrahepatic biliary ductal dilatation, including on image 36/series 16.  1.7 cm gallstone is identified in the gallbladder neck on image 20/series 4.  There is minimal gallbladder wall thickening medially at 5 mm on image 28/series 3.  No pericholecystic edema.  On arterial phase images, there is  hyperemia within the pericholecystic portion of the liver on image 50/series 14.  Common duct measures 7 -8 mm, including on image 18/series 12. Upper normal 6 mm in this age group.  No evidence of common duct stone or obstructive mass.  Normal adrenal glands and kidneys, without abdominal adenopathy or ascites.  IMPRESSION: Cholelithiasis.  Mild gallbladder wall thickening with pericholecystic hyperemia within the liver.  Although early acute cholecystitis cannot be excluded, no specific findings were identified on the ultrasound performed earlier in the day.  Mild intrahepatic biliary ductal dilatation with common duct borderline to minimally dilated.  No cause is seen.  The intrahepatic ductal dilatation could be partially secondary to a component of chronic inflammation due to the cholelithiasis (i.e. mild Mirizzi syndrome).   Original Report Authenticated By: Jeronimo Greaves, M.D.   Mr Abd W/wo Cm/mrcp  03/02/2013   *RADIOLOGY REPORT*  Clinical Data:  Right-sided abdominal pain.  Biliary ductal dilatation on ultrasound.  MRI ABDOMEN WITHOUT AND WITH CONTRAST (INCLUDING MRCP)  Technique:  Multiplanar multisequence MR imaging of the abdomen was performed both before and after the administration of intravenous contrast. Heavily T2-weighted images of the biliary and  pancreatic ducts were obtained, and three-dimensional MRCP images were rendered by post processing.  Contrast: 20mL MULTIHANCE GADOBENATE DIMEGLUMINE 529 MG/ML IV SOLN  Comparison:  Ultrasound of same date  Findings:  Normal aortic caliber without dissection.  Tiny hiatal hernia.   No focal liver lesion.  Normal spleen, distal stomach, pancreas. No pancreatic ductal dilatation.  There is mild intrahepatic biliary ductal dilatation, including on image 36/series 16.  1.7 cm gallstone is identified in the gallbladder neck on image 20/series 4.  There is minimal gallbladder wall thickening medially at 5 mm on image 28/series 3.  No pericholecystic edema.  On arterial phase images, there is  hyperemia within the pericholecystic portion of the liver on image 50/series 14.  Common duct measures 7 -8 mm, including on image 18/series 12. Upper normal 6 mm in this age group.  No evidence of common duct stone or obstructive mass.  Normal adrenal glands and kidneys, without abdominal adenopathy or ascites.  IMPRESSION: Cholelithiasis.  Mild gallbladder wall thickening with pericholecystic hyperemia within the liver.  Although early acute cholecystitis cannot be excluded, no specific findings were identified on the ultrasound performed earlier in the day.  Mild intrahepatic biliary ductal dilatation with common duct borderline to minimally dilated.  No cause is seen.  The intrahepatic ductal dilatation could be partially secondary to a component of chronic inflammation due to the cholelithiasis (i.e. mild Mirizzi syndrome).   Original Report Authenticated By: Jeronimo Greaves, M.D.   US Abdomen Limited Ruq  03/01/2013   *RADIOLOGY REPORT*  Clinical Data:  Right upper quadrant pain  LIMITED ABDOMINAL ULTRASOUND - RIGHT UPPER QUADRANT  Comparison:  None.  Findings:  Gallbladder:  Mobile echogenic focus in the gallbladder neck demonstrates posterior acoustic shadowing consistent with cholelithiasis.  The stone measures 1.7 cm in diameter.  No pericholecystic fluid or gallbladder wall thickening.  Per the sonographer, as sonographic Murphy's sign was negative.  Common bile duct:  The common bile duct is borderline enlarged at 8 mm in the region the pancreatic head.  No definite choledocholithiasis seen within  the visualized portion of the common bile duct.  Liver:  Unremarkable appearance the hepatic parenchyma.  Normal echogenicity.  No focal lesion identified.  IMPRESSION:  1.  Cholelithiasis without secondary sonographic findings to suggest acute cholecystitis.  2.  Borderline enlargement of the common bile duct to 8 mm in the region of the pancreatic head.  No definite choledocholithiasis or distal obstructing mass.  Recommend clinical correlation with serum bilirubin and LFTs.  If additional imaging is clinically warranted, consider MRCP.                    Original Report Authenticated By: Malachy Moan, M.D.    Anti-infectives: Anti-infectives   Start     Dose/Rate Route Frequency Ordered Stop   03/01/13 2300  ampicillin-sulbactam (UNASYN) 1.5 g in sodium chloride 0.9 % 50 mL IVPB     1.5 g 100 mL/hr over 30 Minutes Intravenous Every 6 hours 03/01/13 2243        Assessment/Plan: Impression: Acute cholecystitis, cholelithiasis. Leukocytosis is improving. We'll proceed with laparoscopic cholecystectomy tomorrow.  LOS: 1 day    Tari Lecount A 03/02/2013

## 2013-03-02 NOTE — Progress Notes (Signed)
Patient being transferred to dept 300. Report called and given to Dagoberto Ligas, RN. Patient alert, oriented and in stable condition at the time of transfer. Patient being transferred to room 334 in wheelchair by NT. Patient's belongings and medications transported with her.

## 2013-03-03 ENCOUNTER — Encounter (HOSPITAL_COMMUNITY): Payer: Self-pay

## 2013-03-03 ENCOUNTER — Inpatient Hospital Stay (HOSPITAL_COMMUNITY): Payer: Medicaid Other | Admitting: Anesthesiology

## 2013-03-03 ENCOUNTER — Encounter (HOSPITAL_COMMUNITY): Payer: Self-pay | Admitting: Anesthesiology

## 2013-03-03 ENCOUNTER — Encounter (HOSPITAL_COMMUNITY)
Admission: EM | Disposition: A | Discharge status: Home / Self Care | Payer: Self-pay | Source: Home / Self Care | Attending: General Surgery

## 2013-03-03 HISTORY — PX: CHOLECYSTECTOMY: SHX55

## 2013-03-03 SURGERY — LAPAROSCOPIC CHOLECYSTECTOMY
Anesthesia: General | Site: Abdomen | Wound class: Contaminated

## 2013-03-03 MED ORDER — FENTANYL CITRATE 0.05 MG/ML IJ SOLN
INTRAMUSCULAR | Status: AC
  Filled 2013-03-03: qty 2

## 2013-03-03 MED ORDER — FENTANYL CITRATE 0.05 MG/ML IJ SOLN
25.0000 ug | INTRAMUSCULAR | Status: DC | PRN
  Administered 2013-03-03 (×2): 25 ug via INTRAVENOUS

## 2013-03-03 MED ORDER — ROCURONIUM BROMIDE 50 MG/5ML IV SOLN
INTRAVENOUS | Status: AC
  Filled 2013-03-03: qty 1

## 2013-03-03 MED ORDER — GLYCOPYRROLATE 0.2 MG/ML IJ SOLN
INTRAMUSCULAR | Status: AC
  Filled 2013-03-03: qty 1

## 2013-03-03 MED ORDER — ONDANSETRON HCL 4 MG PO TABS: 4.0000 mg | ORAL_TABLET | Freq: Four times a day (QID) | ORAL | Status: DC | PRN

## 2013-03-03 MED ORDER — NEOSTIGMINE METHYLSULFATE 1 MG/ML IJ SOLN
INTRAMUSCULAR | Status: DC | PRN
  Administered 2013-03-03: 3 mg via INTRAVENOUS

## 2013-03-03 MED ORDER — GLYCOPYRROLATE 0.2 MG/ML IJ SOLN
0.2000 mg | Freq: Once | INTRAMUSCULAR | Status: AC
  Administered 2013-03-03: 0.2 mg via INTRAVENOUS

## 2013-03-03 MED ORDER — FENTANYL CITRATE 0.05 MG/ML IJ SOLN
25.0000 ug | INTRAMUSCULAR | Status: DC | PRN
  Administered 2013-03-03 (×2): 50 ug via INTRAVENOUS

## 2013-03-03 MED ORDER — MIDAZOLAM HCL 2 MG/2ML IJ SOLN
INTRAMUSCULAR | Status: AC
  Filled 2013-03-03: qty 2

## 2013-03-03 MED ORDER — FENTANYL CITRATE 0.05 MG/ML IJ SOLN
INTRAMUSCULAR | Status: AC
  Filled 2013-03-03: qty 5

## 2013-03-03 MED ORDER — AMPICILLIN-SULBACTAM SODIUM 1.5 (1-0.5) G IJ SOLR
1.5000 g | Freq: Four times a day (QID) | INTRAMUSCULAR | Status: DC
  Administered 2013-03-03: 1.5 g via INTRAVENOUS
  Filled 2013-03-03 (×12): qty 1.5

## 2013-03-03 MED ORDER — LACTATED RINGERS IV SOLN: INTRAVENOUS | Status: DC

## 2013-03-03 MED ORDER — ROCURONIUM BROMIDE 100 MG/10ML IV SOLN
INTRAVENOUS | Status: DC | PRN
  Administered 2013-03-03: 10 mg via INTRAVENOUS
  Administered 2013-03-03: 25 mg via INTRAVENOUS
  Administered 2013-03-03: 10 mg via INTRAVENOUS

## 2013-03-03 MED ORDER — 0.9 % SODIUM CHLORIDE (POUR BTL) OPTIME
TOPICAL | Status: DC | PRN
  Administered 2013-03-03: 1000 mL

## 2013-03-03 MED ORDER — GLYCOPYRROLATE 0.2 MG/ML IJ SOLN
INTRAMUSCULAR | Status: DC | PRN
  Administered 2013-03-03: .5 mg via INTRAVENOUS

## 2013-03-03 MED ORDER — ONDANSETRON HCL 4 MG/2ML IJ SOLN
INTRAMUSCULAR | Status: AC
  Filled 2013-03-03: qty 2

## 2013-03-03 MED ORDER — ONDANSETRON HCL 4 MG/2ML IJ SOLN: 4.0000 mg | Freq: Once | INTRAMUSCULAR | Status: DC | PRN

## 2013-03-03 MED ORDER — OXYCODONE-ACETAMINOPHEN 7.5-325 MG PO TABS: 1.0000 | ORAL_TABLET | ORAL | Status: DC | PRN

## 2013-03-03 MED ORDER — OXYCODONE-ACETAMINOPHEN 5-325 MG PO TABS
1.0000 | ORAL_TABLET | ORAL | Status: DC | PRN
  Administered 2013-03-03: 1 via ORAL
  Filled 2013-03-03: qty 1

## 2013-03-03 MED ORDER — HYDROMORPHONE HCL PF 1 MG/ML IJ SOLN: 1.0000 mg | INTRAMUSCULAR | Status: DC | PRN

## 2013-03-03 MED ORDER — PROPOFOL 10 MG/ML IV EMUL
INTRAVENOUS | Status: AC
  Filled 2013-03-03: qty 20

## 2013-03-03 MED ORDER — MIDAZOLAM HCL 2 MG/2ML IJ SOLN
1.0000 mg | INTRAMUSCULAR | Status: AC | PRN
  Administered 2013-03-03 (×3): 2 mg via INTRAVENOUS

## 2013-03-03 MED ORDER — LIDOCAINE HCL (CARDIAC) 10 MG/ML IV SOLN
INTRAVENOUS | Status: DC | PRN
  Administered 2013-03-03: 40 mg via INTRAVENOUS

## 2013-03-03 MED ORDER — ONDANSETRON HCL 4 MG/2ML IJ SOLN
4.0000 mg | Freq: Once | INTRAMUSCULAR | Status: AC
  Administered 2013-03-03: 4 mg via INTRAVENOUS

## 2013-03-03 MED ORDER — ENOXAPARIN SODIUM 40 MG/0.4ML ~~LOC~~ SOLN: 40.0000 mg | SUBCUTANEOUS | Status: DC

## 2013-03-03 MED ORDER — HEMOSTATIC AGENTS (NO CHARGE) OPTIME
TOPICAL | Status: DC | PRN
  Administered 2013-03-03: 1 via TOPICAL

## 2013-03-03 MED ORDER — LACTATED RINGERS IV SOLN
INTRAVENOUS | Status: DC
  Administered 2013-03-03 (×2): via INTRAVENOUS

## 2013-03-03 MED ORDER — LIDOCAINE HCL (PF) 1 % IJ SOLN
INTRAMUSCULAR | Status: AC
  Filled 2013-03-03: qty 5

## 2013-03-03 MED ORDER — BUPIVACAINE HCL (PF) 0.5 % IJ SOLN
INTRAMUSCULAR | Status: AC
  Filled 2013-03-03: qty 30

## 2013-03-03 MED ORDER — NICOTINE 21 MG/24HR TD PT24: 21.0000 mg | MEDICATED_PATCH | Freq: Every day | TRANSDERMAL | Status: DC

## 2013-03-03 MED ORDER — BUPIVACAINE HCL (PF) 0.5 % IJ SOLN
INTRAMUSCULAR | Status: DC | PRN
  Administered 2013-03-03: 10 mL

## 2013-03-03 MED ORDER — PROPOFOL 10 MG/ML IV BOLUS
INTRAVENOUS | Status: DC | PRN
  Administered 2013-03-03: 175 mg via INTRAVENOUS

## 2013-03-03 MED ORDER — FENTANYL CITRATE 0.05 MG/ML IJ SOLN
INTRAMUSCULAR | Status: DC | PRN
  Administered 2013-03-03 (×9): 50 ug via INTRAVENOUS

## 2013-03-03 MED ORDER — ONDANSETRON HCL 4 MG/2ML IJ SOLN: 4.0000 mg | Freq: Four times a day (QID) | INTRAMUSCULAR | Status: DC | PRN

## 2013-03-03 SURGICAL SUPPLY — 33 items
APPLIER CLIP LAPSCP 10X32 DD (CLIP) ×2 IMPLANT
BAG HAMPER (MISCELLANEOUS) ×2 IMPLANT
CLOTH BEACON ORANGE TIMEOUT ST (SAFETY) ×2 IMPLANT
COVER LIGHT HANDLE STERIS (MISCELLANEOUS) ×4 IMPLANT
DECANTER SPIKE VIAL GLASS SM (MISCELLANEOUS) ×2 IMPLANT
DURAPREP 26ML APPLICATOR (WOUND CARE) ×2 IMPLANT
ELECT REM PT RETURN 9FT ADLT (ELECTROSURGICAL) ×2
ELECTRODE REM PT RTRN 9FT ADLT (ELECTROSURGICAL) ×1 IMPLANT
FILTER SMOKE EVAC LAPAROSHD (FILTER) ×2 IMPLANT
FORMALIN 10 PREFIL 480ML (MISCELLANEOUS) ×2 IMPLANT
GLOVE BIO SURGEON STRL SZ7.5 (GLOVE) ×2 IMPLANT
GLOVE BIOGEL PI IND STRL 7.0 (GLOVE) ×4 IMPLANT
GLOVE BIOGEL PI INDICATOR 7.0 (GLOVE) ×4
GLOVE ECLIPSE 7.0 STRL STRAW (GLOVE) ×2 IMPLANT
GLOVE OPTIFIT SS 6.5 STRL BRWN (GLOVE) ×4 IMPLANT
GOWN STRL REIN XL XLG (GOWN DISPOSABLE) ×10 IMPLANT
HEMOSTAT SNOW SURGICEL 2X4 (HEMOSTASIS) ×2 IMPLANT
INST SET LAPROSCOPIC AP (KITS) ×2 IMPLANT
KIT ROOM TURNOVER APOR (KITS) ×2 IMPLANT
KIT TROCAR LAP CHOLE (TROCAR) ×2 IMPLANT
MANIFOLD NEPTUNE II (INSTRUMENTS) ×2 IMPLANT
NS IRRIG 1000ML POUR BTL (IV SOLUTION) ×2 IMPLANT
PACK LAP CHOLE LZT030E (CUSTOM PROCEDURE TRAY) ×2 IMPLANT
PAD ARMBOARD 7.5X6 YLW CONV (MISCELLANEOUS) ×2 IMPLANT
POUCH SPECIMEN RETRIEVAL 10MM (ENDOMECHANICALS) ×2 IMPLANT
SET BASIN LINEN APH (SET/KITS/TRAYS/PACK) ×2 IMPLANT
SPONGE GAUZE 2X2 8PLY STRL LF (GAUZE/BANDAGES/DRESSINGS) ×8 IMPLANT
STAPLER VISISTAT (STAPLE) ×2 IMPLANT
SUT VICRYL 0 UR6 27IN ABS (SUTURE) ×2 IMPLANT
TAPE CLOTH SURG 4X10 WHT LF (GAUZE/BANDAGES/DRESSINGS) ×2 IMPLANT
TUBING INSUFFLATION (TUBING) ×2 IMPLANT
WARMER LAPAROSCOPE (MISCELLANEOUS) ×2 IMPLANT
YANKAUER SUCT 12FT TUBE ARGYLE (SUCTIONS) ×2 IMPLANT

## 2013-03-03 NOTE — Progress Notes (Signed)
Valerie Robinson was present here at [facility] for the Surgery for [name] today.  She came in on March 01, 2013 and had Surgery on March 03, 2013.  [name] was present throughout the day for the Surgery.

## 2013-03-03 NOTE — Anesthesia Preprocedure Evaluation (Signed)
Anesthesia Evaluation  Patient identified by MRN, date of birth, ID band Patient awake    Reviewed: Allergy & Precautions, NPO status , Patient's Chart, lab work & pertinent test results  Airway Mallampati: I TM Distance: >3 FB     Dental  (+) Teeth Intact   Pulmonary Current Smoker,  breath sounds clear to auscultation        Cardiovascular negative cardio ROS  Rhythm:Regular Rate:Normal     Neuro/Psych PSYCHIATRIC DISORDERS Anxiety    GI/Hepatic   Endo/Other    Renal/GU      Musculoskeletal   Abdominal   Peds  Hematology   Anesthesia Other Findings   Reproductive/Obstetrics                           Anesthesia Physical Anesthesia Plan  ASA: I  Anesthesia Plan: General   Post-op Pain Management:    Induction: Intravenous  Airway Management Planned: Oral ETT  Additional Equipment:   Intra-op Plan:   Post-operative Plan: Extubation in OR  Informed Consent: I have reviewed the patients History and Physical, chart, labs and discussed the procedure including the risks, benefits and alternatives for the proposed anesthesia with the patient or authorized representative who has indicated his/her understanding and acceptance.     Plan Discussed with:   Anesthesia Plan Comments:         Anesthesia Quick Evaluation

## 2013-03-03 NOTE — Transfer of Care (Signed)
Immediate Anesthesia Transfer of Care Note  Patient: Valerie Robinson  Procedure(s) Performed: Procedure(s): LAPAROSCOPIC CHOLECYSTECTOMY (N/A)  Patient Location: PACU  Anesthesia Type:General  Level of Consciousness: awake, alert  and oriented  Airway & Oxygen Therapy: Patient Spontanous Breathing and Patient connected to face mask oxygen  Post-op Assessment: Report given to PACU RN  Post vital signs: Reviewed and stable  Complications: No apparent anesthesia complications

## 2013-03-03 NOTE — Progress Notes (Signed)
Pt had her super and had no complains of any pain, nausea, or vomiting.  She also ambulated approximately 250 feet she no complaints after.  Her dose antibiotics was given previous to discharge.  I also put in a progress note due to her significant other  Stating that he needed a note for work that he was here with the patient during her surgery.  Discharge instructions, prescriptions and care notes were given to the pt/care giver.  They both verbalized understanding. The patient left the floor via w/c with staff in stable condition.

## 2013-03-03 NOTE — Op Note (Signed)
Patient:  Valerie Robinson  DOB:  1986/05/22  MRN:  [id number]   Preop Diagnosis:  Acute cholecystitis, cholelithiasis  Postop Diagnosis:  Same  Procedure:  Laparoscopic cholecystectomy  Surgeon:  Franky Macho, M.D.  Anes:  General endotracheal  Indications:  Patient is a 27 year old white female presents with acute cholecystitis secondary to cholelithiasis. The risks and benefits of the procedure including bleeding, infection, hepatobiliary injury, and the possibility of an open procedure were fully explained to the patient, who gave informed consent.  Procedure note:  The patient was placed in the supine position. After induction of general endotracheal anesthesia, the abdomen was prepped and draped using usual sterile technique with DuraPrep. Surgical site confirmation was performed.  A subumbilical incision was made down the fascia. A Veress needle was introduced into the abdominal cavity and confirmation of placement was done using the saline drop test. The abdomen was then insufflated to 16 mm mercury pressure. An 11 mm trocar was introduced into the abdominal cavity under direct visualization without difficulty. The patient was placed in reverse Trendelenburg position and additional 11 mm trocar was placed the epigastric region and 5 mm trochars were placed the right upper quadrant and right flank regions. The liver was inspected and noted within normal limits. The gallbladder was noted to be edematous, distended, with a thickened gallbladder wall. In order to facilitate exposure, the gallbladder was decompressed. Hydrops of the gallbladder was found. The gallbladder was then retracted in a dynamic fashion in order to expose the triangle of Calot. The cystic duct was first identified. Its junction to the infundibulum was fully identified. Endoclips were placed proximally distally on the cystic duct, and the cystic duct was divided. This likewise done the cystic artery. The gallbladder was  then freed away from the gallbladder fossa using Bovie electrocautery. The gallbladder was delivered through the epigastric trocar site using an Endo Catch bag. The gallbladder fossa was inspected and no abnormal bleeding or bile leakage was noted. Surgicel is placed the gallbladder fossa. All fluid and air were then evacuated from the abdominal cavity prior to removal of the trochars.  All wounds were irrigated normal saline. All wounds were injected with 0.5% Sensorcaine. The supraumbilical fascia as well as epigastric fascia reapproximated using 0 Vicryl interrupted sutures. All skin incisions were closed using staples. Betadine ointment and dry sterile dressings were applied.  All tape and needle counts were correct at the end of the procedure. Patient was extubated in the operating room and transferred to PACU in stable condition.  Complications:  None  EBL:  Minimal  Specimen:  Gallbladder

## 2013-03-03 NOTE — Anesthesia Postprocedure Evaluation (Signed)
  Anesthesia Post-op Note  Patient: Valerie Robinson  Procedure(s) Performed: Procedure(s): LAPAROSCOPIC CHOLECYSTECTOMY (N/A)  Patient Location: PACU  Anesthesia Type:General  Level of Consciousness: awake, alert  and oriented  Airway and Oxygen Therapy: Patient Spontanous Breathing and Patient connected to face mask oxygen  Post-op Pain: none  Post-op Assessment: Post-op Vital signs reviewed, Patient's Cardiovascular Status Stable, Respiratory Function Stable, Patent Airway and No signs of Nausea or vomiting  Post-op Vital Signs: Reviewed and stable  Complications: No apparent anesthesia complications

## 2013-03-04 NOTE — Discharge Summary (Signed)
Physician Discharge Summary  Patient ID: Valerie Robinson MRN: [id number] DOB/AGE: 21-May-1986 63 y.o.  Admit date: 03/01/2013 Discharge date: 03/03/2013  Admission Diagnoses: Cholecystitis, cholelithiasis  Discharge Diagnoses: Same Active Problems:   * No active hospital problems. *   Discharged Condition: good  Hospital Course: Patient is a 27 year old white female presented to the emergency room with right upper quadrant abdominal pain. Ultrasound was positive for cholecystitis, cholelithiasis. An MRCP was performed which revealed no choledocholithiasis. She was admitted to the hospital for further management and treatment. She subsequently underwent laparoscopic cholecystectomy on 03/03/2013. She tolerated the procedure well. Her postoperative course was unremarkable. She was discharged home later that evening.  Treatments: surgery: Laparoscopic cholecystectomy on 03/03/2013  Discharge Exam: Blood pressure 100/67, pulse 97, temperature 98 F (36.7 C), temperature source Oral, resp. rate 20, height 5\' 6"  (1.676 m), weight 93.895 kg (207 lb), SpO2 96.00%. General appearance: alert, cooperative and no distress Resp: clear to auscultation bilaterally Cardio: regular rate and rhythm, S1, S2 normal, no murmur, click, rub or gallop GI: Soft. Dressings dry and intact.  Disposition: 01-Home or Self Care     Medication List         acetaminophen 500 MG tablet  Commonly known as:  TYLENOL  Take 1,000 mg by mouth every 6 (six) hours as needed for pain.     medroxyPROGESTERone 150 MG/ML injection  Commonly known as:  DEPO-PROVERA  Inject 150 mg into the muscle every 3 (three) months.     oxyCODONE-acetaminophen 7.5-325 MG per tablet  Commonly known as:  PERCOCET  Take 1-2 tablets by mouth every 4 (four) hours as needed for pain.           Follow-up Information   Follow up with Dalia Heading, MD. Schedule an appointment as soon as possible for a visit on 03/14/2013.   Contact  information:   [id number] [place] [address] [phone]       Signed: Franky Macho A 03/04/2013, 9:39 AM

## 2013-03-06 ENCOUNTER — Encounter (HOSPITAL_COMMUNITY): Payer: Self-pay | Admitting: General Surgery

## 2013-03-25 ENCOUNTER — Encounter (HOSPITAL_COMMUNITY): Payer: Self-pay | Admitting: *Deleted

## 2013-03-25 ENCOUNTER — Emergency Department (HOSPITAL_COMMUNITY)
Admission: EM | Admit: 2013-03-25 | Discharge: 2013-03-26 | Disposition: A | Discharge status: Home / Self Care | Payer: Medicaid Other | Attending: Emergency Medicine | Admitting: Emergency Medicine

## 2013-03-25 DIAGNOSIS — F17200 Nicotine dependence, unspecified, uncomplicated: Secondary | ICD-10-CM | POA: Insufficient documentation

## 2013-03-25 DIAGNOSIS — M549 Dorsalgia, unspecified: Secondary | ICD-10-CM | POA: Insufficient documentation

## 2013-03-25 DIAGNOSIS — Z79899 Other long term (current) drug therapy: Secondary | ICD-10-CM | POA: Insufficient documentation

## 2013-03-25 DIAGNOSIS — Z8742 Personal history of other diseases of the female genital tract: Secondary | ICD-10-CM | POA: Insufficient documentation

## 2013-03-25 DIAGNOSIS — Z8679 Personal history of other diseases of the circulatory system: Secondary | ICD-10-CM | POA: Insufficient documentation

## 2013-03-25 DIAGNOSIS — R6883 Chills (without fever): Secondary | ICD-10-CM | POA: Insufficient documentation

## 2013-03-25 DIAGNOSIS — R110 Nausea: Secondary | ICD-10-CM | POA: Insufficient documentation

## 2013-03-25 DIAGNOSIS — R102 Pelvic and perineal pain: Secondary | ICD-10-CM

## 2013-03-25 DIAGNOSIS — Z3202 Encounter for pregnancy test, result negative: Secondary | ICD-10-CM | POA: Insufficient documentation

## 2013-03-25 DIAGNOSIS — Z9889 Other specified postprocedural states: Secondary | ICD-10-CM | POA: Insufficient documentation

## 2013-03-25 DIAGNOSIS — Z8659 Personal history of other mental and behavioral disorders: Secondary | ICD-10-CM | POA: Insufficient documentation

## 2013-03-25 DIAGNOSIS — N949 Unspecified condition associated with female genital organs and menstrual cycle: Secondary | ICD-10-CM | POA: Insufficient documentation

## 2013-03-25 LAB — URINALYSIS, ROUTINE W REFLEX MICROSCOPIC
Glucose, UA: NEGATIVE mg/dL
Hgb urine dipstick: NEGATIVE
Ketones, ur: NEGATIVE mg/dL
Leukocytes, UA: NEGATIVE
pH: 6 (ref 5.0–8.0)

## 2013-03-25 LAB — PREGNANCY, URINE: Preg Test, Ur: NEGATIVE

## 2013-03-25 MED ORDER — ONDANSETRON HCL 4 MG/2ML IJ SOLN
4.0000 mg | Freq: Once | INTRAMUSCULAR | Status: AC
  Administered 2013-03-26: 4 mg via INTRAVENOUS
  Filled 2013-03-25: qty 2

## 2013-03-25 MED ORDER — HYDROMORPHONE HCL PF 1 MG/ML IJ SOLN
1.0000 mg | Freq: Once | INTRAMUSCULAR | Status: AC
  Administered 2013-03-26: 1 mg via INTRAVENOUS
  Filled 2013-03-25: qty 1

## 2013-03-25 NOTE — ED Notes (Signed)
Pt with lower abd pain across, denies burning with urination, denies any vaginal discharge

## 2013-03-25 NOTE — ED Notes (Signed)
Pt reporting pain in lower abdomen.  Reports history of ruptured ovarian cysts and stats pain is same.  Reporting some nausea and vomiting.  States symptoms started yesterday, no relief from tylenol.

## 2013-03-25 NOTE — ED Provider Notes (Signed)
CSN: [id number]     Arrival date & time 03/25/13  2115 History     First MD Initiated Contact with Patient 03/25/13 2336     Chief Complaint  Patient presents with  . Abdominal Pain   (Consider location/radiation/quality/duration/timing/severity/associated sxs/prior Treatment) Patient is a 27 y.o. female presenting with abdominal pain. The history is provided by the patient.  Abdominal Pain This is a new problem. The current episode started yesterday. The problem occurs constantly. The problem has been gradually worsening. Associated symptoms include abdominal pain, chills and nausea. Pertinent negatives include no fever, headaches, rash, urinary symptoms, vomiting or weakness.   Valerie Robinson is a 27 y.o. female who presents to the ED with pelvic pain. The pain started yesterday. She has a history of ovarian cysts that was diagnosed 3 months ago at [facility]. Her last pap smear was 2 years ago and was abnormal. She had cryotherapy. She has had no GYN follow up since that time. Denies hx of STD's. Current sex partner x 4 years.   Past Medical History  Diagnosis Date  . Ovarian cyst   . Anxiety   . Bradycardia     pt says was seen at [facility] for bradycardia but was never put on any medication for it.   Past Surgical History  Procedure Laterality Date  . Cesarean section      x2  . Cholecystectomy N/A 03/03/2013    Procedure: LAPAROSCOPIC CHOLECYSTECTOMY;  Surgeon: Dalia Heading, MD;  Location: [facility];  Service: General;  Laterality: N/A;   History reviewed. No pertinent family history. History  Substance Use Topics  . Smoking status: Current Every Day Smoker -- 0.50 packs/day for 3 years    Types: Cigarettes  . Smokeless tobacco: Not on file  . Alcohol Use: No   OB History   Grav Para Term Preterm Abortions TAB SAB Ect Mult Living   2 2 2       2      Review of Systems  Constitutional: Positive for chills. Negative for fever.  Gastrointestinal: Positive for nausea and abdominal  pain. Negative for vomiting.  Musculoskeletal: Positive for back pain.  Skin: Negative for rash.  Neurological: Negative for weakness and headaches.  Psychiatric/Behavioral: The patient is not nervous/anxious.     Allergies  Naprosyn  Home Medications   Current Outpatient Rx  Name  Route  Sig  Dispense  Refill  . acetaminophen (TYLENOL) 500 MG tablet   Oral   Take 1,000 mg by mouth every 6 (six) hours as needed for pain.         . medroxyPROGESTERone (DEPO-PROVERA) 150 MG/ML injection   Intramuscular   Inject 150 mg into the muscle every 3 (three) months.          BP 102/69  Pulse 80  Temp(Src) 98.6 F (37 C) (Oral)  Resp 22  Ht 5\' 6"  (1.676 m)  Wt 225 lb (102.059 kg)  BMI 36.33 kg/m2  SpO2 100% Physical Exam  Nursing note and vitals reviewed. Constitutional: She is oriented to person, place, and time. She appears well-developed and well-nourished.  HENT:  Head: Normocephalic and atraumatic.  Eyes: EOM are normal.  Neck: Neck supple.  Cardiovascular: Normal rate.   Pulmonary/Chest: Effort normal.  Abdominal: Soft. There is tenderness in the right lower quadrant and left lower quadrant. There is no rebound, no guarding and no CVA tenderness.  Genitourinary:  External genitalia without lesions. White discharge vaginal vault. Positive CMT, bilateral adnexal tenderness, right >left.  Uterus without palpable enlargement.  Musculoskeletal: Normal range of motion.  Neurological: She is alert and oriented to person, place, and time. No cranial nerve deficit.  Skin: Skin is warm and dry.  Psychiatric: She has a normal mood and affect. Her behavior is normal.   Results for orders placed during the hospital encounter of 03/25/13 (from the past 24 hour(s))  URINALYSIS, ROUTINE W REFLEX MICROSCOPIC     Status: Abnormal   Collection Time    03/25/13  9:57 PM      Result Value Range   Color, Urine YELLOW  YELLOW   APPearance CLEAR  CLEAR   Specific Gravity, Urine >1.030  (*) 1.005 - 1.030   pH 6.0  5.0 - 8.0   Glucose, UA NEGATIVE  NEGATIVE mg/dL   Hgb urine dipstick NEGATIVE  NEGATIVE   Bilirubin Urine NEGATIVE  NEGATIVE   Ketones, ur NEGATIVE  NEGATIVE mg/dL   Protein, ur NEGATIVE  NEGATIVE mg/dL   Urobilinogen, UA 0.2  0.0 - 1.0 mg/dL   Nitrite NEGATIVE  NEGATIVE   Leukocytes, UA NEGATIVE  NEGATIVE  PREGNANCY, URINE     Status: None   Collection Time    03/25/13 10:00 PM      Result Value Range   Preg Test, Ur NEGATIVE  NEGATIVE  CBC WITH DIFFERENTIAL     Status: Abnormal   Collection Time    03/25/13 11:58 PM      Result Value Range   WBC 12.0 (*) 4.0 - 10.5 K/uL   RBC 4.41  3.87 - 5.11 MIL/uL   Hemoglobin 14.4  12.0 - 15.0 g/dL   HCT 29.5  62.1 - 30.8 %   MCV 94.3  78.0 - 100.0 fL   MCH 32.7  26.0 - 34.0 pg   MCHC 34.6  30.0 - 36.0 g/dL   RDW 65.7  84.6 - 96.2 %   Platelets 259  150 - 400 K/uL   Neutrophils Relative % 54  43 - 77 %   Neutro Abs 6.5  1.7 - 7.7 K/uL   Lymphocytes Relative 38  12 - 46 %   Lymphs Abs 4.5 (*) 0.7 - 4.0 K/uL   Monocytes Relative 5  3 - 12 %   Monocytes Absolute 0.6  0.1 - 1.0 K/uL   Eosinophils Relative 3  0 - 5 %   Eosinophils Absolute 0.4  0.0 - 0.7 K/uL   Basophils Relative 0  0 - 1 %   Basophils Absolute 0.0  0.0 - 0.1 K/uL  WET PREP, GENITAL     Status: Abnormal   Collection Time    03/26/13 12:00 AM      Result Value Range   Yeast Wet Prep HPF POC NONE SEEN  NONE SEEN   Trich, Wet Prep NONE SEEN  NONE SEEN   Clue Cells Wet Prep HPF POC FEW (*) NONE SEEN   WBC, Wet Prep HPF POC RARE (*) NONE SEEN    ED Course   Procedures   MDM  27 y.o. female with pelvic pain. Improved with IV dilaudid and Zofran. Will schedule for outpatient ultrasound in the am. Patient home with pre pack of Percocet for pain. Stable for discharge with minimal pain at this time.   [address] [place], [place] 03/26/13 [phone]

## 2013-03-26 ENCOUNTER — Inpatient Hospital Stay (HOSPITAL_COMMUNITY): Admit: 2013-03-26 | Payer: Medicaid Other

## 2013-03-26 ENCOUNTER — Other Ambulatory Visit (HOSPITAL_COMMUNITY): Payer: Self-pay | Admitting: Nurse Practitioner

## 2013-03-26 DIAGNOSIS — R102 Pelvic and perineal pain: Secondary | ICD-10-CM

## 2013-03-26 LAB — CBC WITH DIFFERENTIAL/PLATELET
Basophils Absolute: 0 10*3/uL (ref 0.0–0.1)
Basophils Relative: 0 % (ref 0–1)
Eosinophils Relative: 3 % (ref 0–5)
HCT: 41.6 % (ref 36.0–46.0)
MCHC: 34.6 g/dL (ref 30.0–36.0)
MCV: 94.3 fL (ref 78.0–100.0)
Monocytes Absolute: 0.6 10*3/uL (ref 0.1–1.0)
RDW: 12.4 % (ref 11.5–15.5)

## 2013-03-26 LAB — WET PREP, GENITAL: Yeast Wet Prep HPF POC: NONE SEEN

## 2013-03-26 MED ORDER — OXYCODONE-ACETAMINOPHEN 5-325 MG PO TABS: 1.0000 | ORAL_TABLET | ORAL | Status: DC | PRN

## 2013-03-26 MED ORDER — PROMETHAZINE HCL 25 MG/ML IJ SOLN
12.5000 mg | Freq: Once | INTRAMUSCULAR | Status: AC
  Administered 2013-03-26: 12.5 mg via INTRAVENOUS
  Filled 2013-03-26: qty 1

## 2013-03-26 NOTE — ED Provider Notes (Signed)
Medical screening examination/treatment/procedure(s) were performed by non-physician practitioner and as supervising physician I was immediately available for consultation/collaboration.   Dione Booze, MD 03/26/13 [phone]

## 2013-03-26 NOTE — ED Notes (Signed)
Pt had outpatient [place] ordered for today. Pt was a NO SHOW for appointments scheduled.

## 2013-03-27 ENCOUNTER — Emergency Department (HOSPITAL_COMMUNITY): Payer: Medicaid Other

## 2013-03-27 ENCOUNTER — Emergency Department (HOSPITAL_COMMUNITY)
Admission: EM | Admit: 2013-03-27 | Discharge: 2013-03-27 | Discharge status: Against Medical Advice | Payer: Medicaid Other | Attending: Emergency Medicine | Admitting: Emergency Medicine

## 2013-03-27 ENCOUNTER — Ambulatory Visit (HOSPITAL_COMMUNITY): Admission: RE | Admit: 2013-03-27 | Payer: Medicaid Other | Source: Ambulatory Visit

## 2013-03-27 ENCOUNTER — Encounter (HOSPITAL_COMMUNITY): Payer: Self-pay | Admitting: Emergency Medicine

## 2013-03-27 DIAGNOSIS — R1031 Right lower quadrant pain: Secondary | ICD-10-CM | POA: Insufficient documentation

## 2013-03-27 DIAGNOSIS — R109 Unspecified abdominal pain: Secondary | ICD-10-CM

## 2013-03-27 DIAGNOSIS — Z8659 Personal history of other mental and behavioral disorders: Secondary | ICD-10-CM | POA: Insufficient documentation

## 2013-03-27 DIAGNOSIS — N949 Unspecified condition associated with female genital organs and menstrual cycle: Secondary | ICD-10-CM | POA: Insufficient documentation

## 2013-03-27 DIAGNOSIS — R102 Pelvic and perineal pain: Secondary | ICD-10-CM

## 2013-03-27 DIAGNOSIS — R112 Nausea with vomiting, unspecified: Secondary | ICD-10-CM | POA: Insufficient documentation

## 2013-03-27 DIAGNOSIS — Z8679 Personal history of other diseases of the circulatory system: Secondary | ICD-10-CM | POA: Insufficient documentation

## 2013-03-27 DIAGNOSIS — Z3202 Encounter for pregnancy test, result negative: Secondary | ICD-10-CM | POA: Insufficient documentation

## 2013-03-27 DIAGNOSIS — Z9889 Other specified postprocedural states: Secondary | ICD-10-CM | POA: Insufficient documentation

## 2013-03-27 DIAGNOSIS — F17200 Nicotine dependence, unspecified, uncomplicated: Secondary | ICD-10-CM | POA: Insufficient documentation

## 2013-03-27 DIAGNOSIS — Z8742 Personal history of other diseases of the female genital tract: Secondary | ICD-10-CM | POA: Insufficient documentation

## 2013-03-27 LAB — CBC WITH DIFFERENTIAL/PLATELET
Basophils Absolute: 0 10*3/uL (ref 0.0–0.1)
Basophils Relative: 0 % (ref 0–1)
Hemoglobin: 13.5 g/dL (ref 12.0–15.0)
MCHC: 34.7 g/dL (ref 30.0–36.0)
Neutro Abs: 5.7 10*3/uL (ref 1.7–7.7)
Neutrophils Relative %: 61 % (ref 43–77)
RDW: 12.5 % (ref 11.5–15.5)
WBC: 9.2 10*3/uL (ref 4.0–10.5)

## 2013-03-27 LAB — PREGNANCY, URINE: Preg Test, Ur: NEGATIVE

## 2013-03-27 LAB — URINALYSIS, ROUTINE W REFLEX MICROSCOPIC
Ketones, ur: NEGATIVE mg/dL
Nitrite: NEGATIVE
pH: 5.5 (ref 5.0–8.0)

## 2013-03-27 LAB — BASIC METABOLIC PANEL
BUN: 6 mg/dL (ref 6–23)
Creatinine, Ser: 0.77 mg/dL (ref 0.50–1.10)
GFR calc Af Amer: >90 mL/min (ref >90)
GFR calc non Af Amer: >90 mL/min (ref >90)
Potassium: 3.5 mEq/L (ref 3.5–5.1)

## 2013-03-27 LAB — GC/CHLAMYDIA PROBE AMP: GC Probe RNA: NEGATIVE

## 2013-03-27 LAB — URINE MICROSCOPIC-ADD ON

## 2013-03-27 MED ORDER — METOCLOPRAMIDE HCL 5 MG/ML IJ SOLN
10.0000 mg | Freq: Once | INTRAMUSCULAR | Status: AC
  Administered 2013-03-27: 10 mg via INTRAVENOUS
  Filled 2013-03-27: qty 2

## 2013-03-27 MED ORDER — MORPHINE SULFATE 4 MG/ML IJ SOLN
4.0000 mg | Freq: Once | INTRAMUSCULAR | Status: DC
  Filled 2013-03-27: qty 1

## 2013-03-27 MED ORDER — SODIUM CHLORIDE 0.9 % IV BOLUS (SEPSIS)
1000.0000 mL | Freq: Once | INTRAVENOUS | Status: AC
  Administered 2013-03-27: 1000 mL via INTRAVENOUS

## 2013-03-27 MED ORDER — MORPHINE SULFATE 4 MG/ML IJ SOLN
6.0000 mg | Freq: Once | INTRAMUSCULAR | Status: AC
  Administered 2013-03-27: 6 mg via INTRAVENOUS
  Filled 2013-03-27: qty 2

## 2013-03-27 NOTE — ED Notes (Signed)
RN to room with pain medication that patient had requested 10 minutes before. Patient not found in room, gown on bed, IV in trash can. RN to parking lot to look for patient. Patient found to be in the driver's seat of her vehicle, driving out of parking lot in [place] [place]. Patient stated she had a ride earlier in visit, prior to receiving morphine.

## 2013-03-27 NOTE — ED Provider Notes (Signed)
CSN: [id number]     Arrival date & time 03/27/13  1500 History  This chart was scribed for Doug Sou, MD by Bennett Scrape, ED Scribe. This patient was seen in room [facility] and the patient's care was started at 4:05 PM.   First MD Initiated Contact with Patient 03/27/13 1523     Chief Complaint  Patient presents with  . Pelvic Pain  . Abdominal Pain    Patient is a 27 y.o. female presenting with pelvic pain. The history is provided by the patient. No language interpreter was used.  Pelvic Pain This is a chronic problem. The current episode started 2 days ago. The problem occurs constantly. The problem has been gradually worsening. Associated symptoms include abdominal pain. Exacerbated by: Sitting, bending over. Relieved by: Peroccet. Treatments tried: Percocet. The treatment provided moderate relief.    HPI Comments: Valerie Robinson is a 27 y.o. female who presents to the Emergency Department complaining of 2 days of worsening pelvic pain that began radiating into the RLQ yesterday. Sitting, movement and bending over aggravates that pain and she rates her pain an 8 out of 10 currently which she states is severe. She reports 10 episodes of associated emesis described as yellow and nausea. She denies eating anything today. Last normal BM was around 6 AM this morning. She admits to experiencing prior similar episodes every 3 months for the past several years attributed to burst cysts. She states that she usually she takes tylenol or is seen in the ED. She was seen in the ED 2 days ago for the same and was discharged with an [place] scheduled and a prescription for Percocet. She reports taking percocet with improvement but did not go to the [place] due to missing the instructions on her discharge information. She is currently on the Depo shot and is unsure when her last menses was. She denies fevers and vaginal discharge as associated symptoms. She denies having a GYN currently but states that she  followed up at [facility] and was turned down for a hysterectomy due to age. She had a cholecystectomy on 03/03/13 with no complications. Pt denies smoking, illegal drug and alcohol use.   Past Medical History  Diagnosis Date  . Ovarian cyst   . Anxiety   . Bradycardia     pt says was seen at [facility] for bradycardia but was never put on any medication for it.   Past Surgical History  Procedure Laterality Date  . Cesarean section      x2  . Cholecystectomy N/A 03/03/2013    Procedure: LAPAROSCOPIC CHOLECYSTECTOMY;  Surgeon: Dalia Heading, MD;  Location: [facility];  Service: General;  Laterality: N/A;   No family history on file. History  Substance Use Topics  . Smoking status: Current Every Day Smoker -- 0.50 packs/day for 3 years    Types: Cigarettes  . Smokeless tobacco: Not on file  . Alcohol Use: No   OB History   Grav Para Term Preterm Abortions TAB SAB Ect Mult Living   2 2 2       2      Review of Systems  Constitutional: Negative.   HENT: Negative.   Respiratory: Negative.   Cardiovascular: Negative.   Gastrointestinal: Positive for nausea, vomiting and abdominal pain.  Genitourinary: Positive for pelvic pain. Negative for vaginal bleeding.       Amenorrheic  Musculoskeletal: Negative.   Skin: Negative.   Neurological: Negative.   Psychiatric/Behavioral: Negative.   All other systems reviewed  and are negative.    Allergies  Naprosyn-confirmed by pt at bedside  Home Medications   Current Outpatient Rx  Name  Route  Sig  Dispense  Refill  . acetaminophen (TYLENOL) 500 MG tablet   Oral   Take 1,000 mg by mouth every 6 (six) hours as needed for pain.         Marland Kitchen oxyCODONE-acetaminophen (PERCOCET/ROXICET) 5-325 MG per tablet   Oral   Take 1 tablet by mouth every 4 (four) hours as needed for pain.   6 tablet   0   . medroxyPROGESTERone (DEPO-PROVERA) 150 MG/ML injection   Intramuscular   Inject 150 mg into the muscle every 3 (three) months.          Triage  Vitals: BP 94/76  Pulse 100  Temp(Src) 98.2 F (36.8 C) (Oral)  Resp 24  Wt 220 lb (99.791 kg)  BMI 35.53 kg/m2  SpO2 100%  Physical Exam  Nursing note and vitals reviewed. Constitutional: She appears well-developed and well-nourished.  HENT:  Head: Normocephalic and atraumatic.  Eyes: Conjunctivae are normal. Pupils are equal, round, and reactive to light.  Neck: Neck supple. No tracheal deviation present. No thyromegaly present.  Cardiovascular: Normal rate and regular rhythm.   No murmur heard. Pulmonary/Chest: Effort normal and breath sounds normal.  Abdominal: Soft. Bowel sounds are normal. She exhibits no distension. There is no tenderness.  Obese completely nontender  Genitourinary:  bilateral adnexal tenderness, right greater than left, positive CMT, slight white vaginal discharge, no external lesion, chaperone present  Musculoskeletal: Normal range of motion. She exhibits no edema and no tenderness.  Neurological: She is alert. Coordination normal.  Skin: Skin is warm and dry. No rash noted.  Psychiatric: She has a normal mood and affect.    ED Course   Procedures (including critical care time)  DIAGNOSTIC STUDIES: Oxygen Saturation is 100% on room air, normal by my interpretation.    COORDINATION OF CARE: 4:19 PM-Discussed treatment plan which includes [place] and UA with pt at bedside and pt agreed to plan. Pt sated that she was not driving herself home. 6:37 PM-Nurse stated that she saw the pt drive away. Advised nurse to contact police.   Labs Reviewed  URINALYSIS, ROUTINE W REFLEX MICROSCOPIC - Abnormal; Notable for the following:    Color, Urine AMBER (*)    APPearance CLOUDY (*)    Specific Gravity, Urine >1.030 (*)    Bilirubin Urine SMALL (*)    Leukocytes, UA SMALL (*)    All other components within normal limits  BASIC METABOLIC PANEL - Abnormal; Notable for the following:    Sodium 134 (*)    All other components within normal limits  URINE  MICROSCOPIC-ADD ON - Abnormal; Notable for the following:    Squamous Epithelial / LPF MANY (*)    Bacteria, UA FEW (*)    Crystals CA OXALATE CRYSTALS (*)    All other components within normal limits  PREGNANCY, URINE  CBC WITH DIFFERENTIAL   US Transvaginal Non-ob  03/27/2013   *RADIOLOGY REPORT*  Clinical Data: Pelvic pain  TRANSABDOMINAL AND TRANSVAGINAL ULTRASOUND OF PELVIS Technique:  Both transabdominal and transvaginal ultrasound examinations of the pelvis were performed. Transabdominal technique was performed for global imaging of the pelvis including uterus, ovaries, adnexal regions, and pelvic cul-de-sac.  It was necessary to proceed with endovaginal exam following the transabdominal exam to visualize the uterus, endometrium and ovaries in better detail.  Comparison:  None  Findings:  Uterus: Normal in  size and appearance.  Uterus measures 6.9 x 3.5 x 5.5 cm.  No focal abnormality or fibroid.    Nonspecific heterogeneous appearance of the endocervical canal.  Endometrium: Normal in thickness and appearance.  AP thickness measures 8.5 mm.  Right ovary:  Normal appearance.  No adnexal mass.  Numerous small follicles.  Right ovary measures 2.9 x 2.4 x 2.5 cm.  Left ovary: Normal appearance.  No adnexal mass.  Similar numerous follicles.  Left ovary measures 3.3 x 2.0 x 1.7 cm.  Other findings: No free fluid  IMPRESSION: No acute finding by pelvic ultrasound.  No free fluid.   Original Report Authenticated By: Judie Petit. Miles Costain, M.D.   US Pelvis Complete  03/27/2013   *RADIOLOGY REPORT*  Clinical Data: Pelvic pain  TRANSABDOMINAL AND TRANSVAGINAL ULTRASOUND OF PELVIS Technique:  Both transabdominal and transvaginal ultrasound examinations of the pelvis were performed. Transabdominal technique was performed for global imaging of the pelvis including uterus, ovaries, adnexal regions, and pelvic cul-de-sac.  It was necessary to proceed with endovaginal exam following the transabdominal exam to visualize the  uterus, endometrium and ovaries in better detail.  Comparison:  None  Findings:  Uterus: Normal in size and appearance.  Uterus measures 6.9 x 3.5 x 5.5 cm.  No focal abnormality or fibroid.    Nonspecific heterogeneous appearance of the endocervical canal.  Endometrium: Normal in thickness and appearance.  AP thickness measures 8.5 mm.  Right ovary:  Normal appearance.  No adnexal mass.  Numerous small follicles.  Right ovary measures 2.9 x 2.4 x 2.5 cm.  Left ovary: Normal appearance.  No adnexal mass.  Similar numerous follicles.  Left ovary measures 3.3 x 2.0 x 1.7 cm.  Other findings: No free fluid  IMPRESSION: No acute finding by pelvic ultrasound.  No free fluid.   Original Report Authenticated By: Judie Petit. Miles Costain, M.D.   No diagnosis found. At 1815 p.m. she stated pain was significantly improved, nausea was gone after treatment with morphine and Reglan IV. At 1825 mg requested more pain medicine. Additional morphine ordered.  Patient left the ED without notifying staff and reportedly drove away in her own car workup complete. I asked the patient if she was driving prior to ordering opioids. She stated that her fianc was driving her. Results for orders placed during the hospital encounter of 03/27/13  URINALYSIS, ROUTINE W REFLEX MICROSCOPIC      Result Value Range   Color, Urine AMBER (*) YELLOW   APPearance CLOUDY (*) CLEAR   Specific Gravity, Urine >1.030 (*) 1.005 - 1.030   pH 5.5  5.0 - 8.0   Glucose, UA NEGATIVE  NEGATIVE mg/dL   Hgb urine dipstick NEGATIVE  NEGATIVE   Bilirubin Urine SMALL (*) NEGATIVE   Ketones, ur NEGATIVE  NEGATIVE mg/dL   Protein, ur NEGATIVE  NEGATIVE mg/dL   Urobilinogen, UA 0.2  0.0 - 1.0 mg/dL   Nitrite NEGATIVE  NEGATIVE   Leukocytes, UA SMALL (*) NEGATIVE  PREGNANCY, URINE      Result Value Range   Preg Test, Ur NEGATIVE  NEGATIVE  BASIC METABOLIC PANEL      Result Value Range   Sodium 134 (*) 135 - 145 mEq/L   Potassium 3.5  3.5 - 5.1 mEq/L   Chloride  103  96 - 112 mEq/L   CO2 24  19 - 32 mEq/L   Glucose, Bld 90  70 - 99 mg/dL   BUN 6  6 - 23 mg/dL   Creatinine, Ser 0.45  0.50 - 1.10 mg/dL   Calcium 9.0  8.4 - 16.1 mg/dL   GFR calc non Af Amer >90  >90 mL/min   GFR calc Af Amer >90  >90 mL/min  CBC WITH DIFFERENTIAL      Result Value Range   WBC 9.2  4.0 - 10.5 K/uL   RBC 4.16  3.87 - 5.11 MIL/uL   Hemoglobin 13.5  12.0 - 15.0 g/dL   HCT 09.6  04.5 - 40.9 %   MCV 93.5  78.0 - 100.0 fL   MCH 32.5  26.0 - 34.0 pg   MCHC 34.7  30.0 - 36.0 g/dL   RDW 81.1  91.4 - 78.2 %   Platelets 227  150 - 400 K/uL   Neutrophils Relative % 61  43 - 77 %   Neutro Abs 5.7  1.7 - 7.7 K/uL   Lymphocytes Relative 30  12 - 46 %   Lymphs Abs 2.8  0.7 - 4.0 K/uL   Monocytes Relative 5  3 - 12 %   Monocytes Absolute 0.5  0.1 - 1.0 K/uL   Eosinophils Relative 4  0 - 5 %   Eosinophils Absolute 0.3  0.0 - 0.7 K/uL   Basophils Relative 0  0 - 1 %   Basophils Absolute 0.0  0.0 - 0.1 K/uL  URINE MICROSCOPIC-ADD ON      Result Value Range   Squamous Epithelial / LPF MANY (*) RARE   WBC, UA 0-2  <3 WBC/hpf   RBC / HPF 0-2  <3 RBC/hpf   Bacteria, UA FEW (*) RARE   Crystals CA OXALATE CRYSTALS (*) NEGATIVE   US Transvaginal Non-ob  03/27/2013   *RADIOLOGY REPORT*  Clinical Data: Pelvic pain  TRANSABDOMINAL AND TRANSVAGINAL ULTRASOUND OF PELVIS Technique:  Both transabdominal and transvaginal ultrasound examinations of the pelvis were performed. Transabdominal technique was performed for global imaging of the pelvis including uterus, ovaries, adnexal regions, and pelvic cul-de-sac.  It was necessary to proceed with endovaginal exam following the transabdominal exam to visualize the uterus, endometrium and ovaries in better detail.  Comparison:  None  Findings:  Uterus: Normal in size and appearance.  Uterus measures 6.9 x 3.5 x 5.5 cm.  No focal abnormality or fibroid.    Nonspecific heterogeneous appearance of the endocervical canal.  Endometrium: Normal in  thickness and appearance.  AP thickness measures 8.5 mm.  Right ovary:  Normal appearance.  No adnexal mass.  Numerous small follicles.  Right ovary measures 2.9 x 2.4 x 2.5 cm.  Left ovary: Normal appearance.  No adnexal mass.  Similar numerous follicles.  Left ovary measures 3.3 x 2.0 x 1.7 cm.  Other findings: No free fluid  IMPRESSION: No acute finding by pelvic ultrasound.  No free fluid.   Original Report Authenticated By: Judie Petit. Miles Costain, M.D.   US Pelvis Complete  03/27/2013   *RADIOLOGY REPORT*  Clinical Data: Pelvic pain  TRANSABDOMINAL AND TRANSVAGINAL ULTRASOUND OF PELVIS Technique:  Both transabdominal and transvaginal ultrasound examinations of the pelvis were performed. Transabdominal technique was performed for global imaging of the pelvis including uterus, ovaries, adnexal regions, and pelvic cul-de-sac.  It was necessary to proceed with endovaginal exam following the transabdominal exam to visualize the uterus, endometrium and ovaries in better detail.  Comparison:  None  Findings:  Uterus: Normal in size and appearance.  Uterus measures 6.9 x 3.5 x 5.5 cm.  No focal abnormality or fibroid.    Nonspecific heterogeneous appearance of the endocervical canal.  Endometrium: Normal in thickness and appearance.  AP thickness measures 8.5 mm.  Right ovary:  Normal appearance.  No adnexal mass.  Numerous small follicles.  Right ovary measures 2.9 x 2.4 x 2.5 cm.  Left ovary: Normal appearance.  No adnexal mass.  Similar numerous follicles.  Left ovary measures 3.3 x 2.0 x 1.7 cm.  Other findings: No free fluid  IMPRESSION: No acute finding by pelvic ultrasound.  No free fluid.   Original Report Authenticated By: Judie Petit. Miles Costain, M.D.   Mr 3d Recon At Scanner  03/02/2013   *RADIOLOGY REPORT*  Clinical Data:  Right-sided abdominal pain.  Biliary ductal dilatation on ultrasound.  MRI ABDOMEN WITHOUT AND WITH CONTRAST (INCLUDING MRCP)  Technique:  Multiplanar multisequence MR imaging of the abdomen was performed both  before and after the administration of intravenous contrast. Heavily T2-weighted images of the biliary and pancreatic ducts were obtained, and three-dimensional MRCP images were rendered by post processing.  Contrast: 20mL MULTIHANCE GADOBENATE DIMEGLUMINE 529 MG/ML IV SOLN  Comparison:  Ultrasound of same date  Findings:  Normal aortic caliber without dissection.  Tiny hiatal hernia.  No focal liver lesion.  Normal spleen, distal stomach, pancreas. No pancreatic ductal dilatation.  There is mild intrahepatic biliary ductal dilatation, including on image 36/series 16.  1.7 cm gallstone is identified in the gallbladder neck on image 20/series 4.  There is minimal gallbladder wall thickening medially at 5 mm on image 28/series 3.  No pericholecystic edema.  On arterial phase images, there is  hyperemia within the pericholecystic portion of the liver on image 50/series 14.  Common duct measures 7 -8 mm, including on image 18/series 12. Upper normal 6 mm in this age group.  No evidence of common duct stone or obstructive mass.  Normal adrenal glands and kidneys, without abdominal adenopathy or ascites.  IMPRESSION: Cholelithiasis.  Mild gallbladder wall thickening with pericholecystic hyperemia within the liver.  Although early acute cholecystitis cannot be excluded, no specific findings were identified on the ultrasound performed earlier in the day.  Mild intrahepatic biliary ductal dilatation with common duct borderline to minimally dilated.  No cause is seen.  The intrahepatic ductal dilatation could be partially secondary to a component of chronic inflammation due to the cholelithiasis (i.e. mild Mirizzi syndrome).   Original Report Authenticated By: Jeronimo Greaves, M.D.   Mr Abd W/wo Cm/mrcp  03/02/2013   *RADIOLOGY REPORT*  Clinical Data:  Right-sided abdominal pain.  Biliary ductal dilatation on ultrasound.  MRI ABDOMEN WITHOUT AND WITH CONTRAST (INCLUDING MRCP)  Technique:  Multiplanar multisequence MR imaging  of the abdomen was performed both before and after the administration of intravenous contrast. Heavily T2-weighted images of the biliary and pancreatic ducts were obtained, and three-dimensional MRCP images were rendered by post processing.  Contrast: 20mL MULTIHANCE GADOBENATE DIMEGLUMINE 529 MG/ML IV SOLN  Comparison:  Ultrasound of same date  Findings:  Normal aortic caliber without dissection.  Tiny hiatal hernia.  No focal liver lesion.  Normal spleen, distal stomach, pancreas. No pancreatic ductal dilatation.  There is mild intrahepatic biliary ductal dilatation, including on image 36/series 16.  1.7 cm gallstone is identified in the gallbladder neck on image 20/series 4.  There is minimal gallbladder wall thickening medially at 5 mm on image 28/series 3.  No pericholecystic edema.  On arterial phase images, there is  hyperemia within the pericholecystic portion of the liver on image 50/series 14.  Common duct measures 7 -8 mm, including on image 18/series 12. Upper normal 6  mm in this age group.  No evidence of common duct stone or obstructive mass.  Normal adrenal glands and kidneys, without abdominal adenopathy or ascites.  IMPRESSION: Cholelithiasis.  Mild gallbladder wall thickening with pericholecystic hyperemia within the liver.  Although early acute cholecystitis cannot be excluded, no specific findings were identified on the ultrasound performed earlier in the day.  Mild intrahepatic biliary ductal dilatation with common duct borderline to minimally dilated.  No cause is seen.  The intrahepatic ductal dilatation could be partially secondary to a component of chronic inflammation due to the cholelithiasis (i.e. mild Mirizzi syndrome).   Original Report Authenticated By: Jeronimo Greaves, M.D.   US Abdomen Limited Ruq  03/01/2013   *RADIOLOGY REPORT*  Clinical Data:  Right upper quadrant pain  LIMITED ABDOMINAL ULTRASOUND - RIGHT UPPER QUADRANT  Comparison:  None.  Findings:  Gallbladder:  Mobile echogenic  focus in the gallbladder neck demonstrates posterior acoustic shadowing consistent with cholelithiasis.  The stone measures 1.7 cm in diameter.  No pericholecystic fluid or gallbladder wall thickening.  Per the sonographer, as sonographic Murphy's sign was negative.  Common bile duct:  The common bile duct is borderline enlarged at 8 mm in the region the pancreatic head.  No definite choledocholithiasis seen within the visualized portion of the common bile duct.  Liver:  Unremarkable appearance the hepatic parenchyma.  Normal echogenicity.  No focal lesion identified.  IMPRESSION:  1.  Cholelithiasis without secondary sonographic findings to suggest acute cholecystitis.  2.  Borderline enlargement of the common bile duct to 8 mm in the region of the pancreatic head.  No definite choledocholithiasis or distal obstructing mass.  Recommend clinical correlation with serum bilirubin and LFTs.  If additional imaging is clinically warranted, consider MRCP.                    Original Report Authenticated By: Malachy Moan, M.D.    MDM  I advised ED staff to notify police as patient was driving under the influence of opioids. Diagnosis abdominal pain      Doug Sou, MD 03/27/13 [phone]

## 2013-03-27 NOTE — ED Notes (Signed)
MD informed of patient elopement.

## 2013-03-27 NOTE — ED Notes (Signed)
States that she has a history of ovarian cysts and states that she feels like a cyst has ruptured.  States that she started having pubic/pelvic pain last night and is now having right lower and right mid lower abdominal pain.  States that she is having nausea and vomiting that started last night.

## 2013-04-14 ENCOUNTER — Emergency Department (HOSPITAL_COMMUNITY): Payer: Medicaid Other

## 2013-04-14 ENCOUNTER — Emergency Department (HOSPITAL_COMMUNITY)
Admission: EM | Admit: 2013-04-14 | Discharge: 2013-04-15 | Disposition: A | Discharge status: Home / Self Care | Payer: Medicaid Other | Attending: Emergency Medicine | Admitting: Emergency Medicine

## 2013-04-14 ENCOUNTER — Encounter (HOSPITAL_COMMUNITY): Payer: Self-pay | Admitting: *Deleted

## 2013-04-14 DIAGNOSIS — F17200 Nicotine dependence, unspecified, uncomplicated: Secondary | ICD-10-CM | POA: Insufficient documentation

## 2013-04-14 DIAGNOSIS — R1031 Right lower quadrant pain: Secondary | ICD-10-CM | POA: Insufficient documentation

## 2013-04-14 DIAGNOSIS — Z9089 Acquired absence of other organs: Secondary | ICD-10-CM | POA: Insufficient documentation

## 2013-04-14 DIAGNOSIS — Z3202 Encounter for pregnancy test, result negative: Secondary | ICD-10-CM | POA: Insufficient documentation

## 2013-04-14 DIAGNOSIS — Z8679 Personal history of other diseases of the circulatory system: Secondary | ICD-10-CM | POA: Insufficient documentation

## 2013-04-14 DIAGNOSIS — R109 Unspecified abdominal pain: Secondary | ICD-10-CM

## 2013-04-14 DIAGNOSIS — Z8742 Personal history of other diseases of the female genital tract: Secondary | ICD-10-CM | POA: Insufficient documentation

## 2013-04-14 DIAGNOSIS — Z8659 Personal history of other mental and behavioral disorders: Secondary | ICD-10-CM | POA: Insufficient documentation

## 2013-04-14 LAB — URINALYSIS, ROUTINE W REFLEX MICROSCOPIC
Glucose, UA: NEGATIVE mg/dL
Hgb urine dipstick: NEGATIVE
Protein, ur: NEGATIVE mg/dL

## 2013-04-14 LAB — PREGNANCY, URINE: Preg Test, Ur: NEGATIVE

## 2013-04-14 NOTE — ED Notes (Signed)
RLQ pain since last night with n/v.  Denies diarrhea/fever.

## 2013-04-14 NOTE — ED Provider Notes (Signed)
CSN: [id number]     Arrival date & time 04/14/13  1821 History  This chart was scribed for Raeford Razor, MD by Bennett Scrape, ED Scribe. This patient was seen in room APA09/AP[facility]09 and the patient's care was started at 10:42 PM.   Chief Complaint  Patient presents with  . Abdominal Pain    The history is provided by the patient. No language interpreter was used.   HPI Comments: Valerie Robinson is a 27 y.o. female who presents to the Emergency Department complaining of intermittent RLQ pain that started last night. She reports that the pain radiates into the suprapubic region. She states that she was playing with her children at the time when she felt a sudden sharp pain. She reports that hot baths and sitting up bent over alleviate her symptoms but denies any other modifying factors. She is currently taking Tylenol with improvement as well. She admits that the symptoms are similar to prior "burst" ovarian cysts and feels similar to the pain she was evaluated in the ED for, just in a different location. [place] during her last ED visit was negative. She denies that she has followed up with a PCP since her last visit. She denies fever, chills, back pain, vaginal bleeding and discharge as associated symptoms. Pt does not have a h/o chronic medical conditions. She reports a prior cholecystectomy done 2 months ago and 2 prior c-sections. She denies any post-surgery complications and reports that her incisions have been healing well.    Past Medical History  Diagnosis Date  . Ovarian cyst   . Anxiety   . Bradycardia     pt says was seen at [facility] for bradycardia but was never put on any medication for it.   Past Surgical History  Procedure Laterality Date  . Cesarean section      x2  . Cholecystectomy N/A 03/03/2013    Procedure: LAPAROSCOPIC CHOLECYSTECTOMY;  Surgeon: Dalia Heading, MD;  Location: [facility];  Service: General;  Laterality: N/A;   No family history on file. History  Substance Use  Topics  . Smoking status: Current Every Day Smoker -- 0.50 packs/day for 3 years    Types: Cigarettes  . Smokeless tobacco: Not on file  . Alcohol Use: No   OB History   Grav Para Term Preterm Abortions TAB SAB Ect Mult Living   2 2 2       2      Review of Systems  Constitutional: Negative for fever and chills.  Gastrointestinal: Positive for abdominal pain. Negative for nausea and vomiting.  Genitourinary: Negative for vaginal bleeding and vaginal discharge.  All other systems reviewed and are negative.    Allergies  Naprosyn  Home Medications   Current Outpatient Rx  Name  Route  Sig  Dispense  Refill  . acetaminophen (TYLENOL) 500 MG tablet   Oral   Take 1,000 mg by mouth every 6 (six) hours as needed for pain.         . medroxyPROGESTERone (DEPO-PROVERA) 150 MG/ML injection   Intramuscular   Inject 150 mg into the muscle every 3 (three) months.         Marland Kitchen oxyCODONE-acetaminophen (PERCOCET/ROXICET) 5-325 MG per tablet   Oral   Take 1 tablet by mouth every 4 (four) hours as needed for pain.   6 tablet   0    Triage Vitals: BP 119/75  Pulse 87  Temp(Src) 98.3 F (36.8 C) (Oral)  Resp 16  Ht 5\' 6"  (  1.676 m)  Wt 220 lb (99.791 kg)  BMI 35.53 kg/m2  SpO2 99%  Physical Exam  Nursing note and vitals reviewed. Constitutional: She is oriented to person, place, and time. She appears well-developed and well-nourished. No distress.  HENT:  Head: Normocephalic and atraumatic.  Eyes: EOM are normal.  Neck: Neck supple. No tracheal deviation present.  Cardiovascular: Normal rate and regular rhythm.   Pulmonary/Chest: Effort normal and breath sounds normal. No respiratory distress.  Abdominal: Soft. There is no tenderness. There is no rebound and no guarding.  Well-healed laparoscopic incisions, no concerning skin lesions   Genitourinary:  No CVA tenderness  Musculoskeletal: Normal range of motion.  Neurological: She is alert and oriented to person, place, and  time.  Skin: Skin is warm and dry.  Psychiatric: She has a normal mood and affect. Her behavior is normal.    ED Course   DIAGNOSTIC STUDIES: Oxygen Saturation is 99% on room air, normal by my interpretation.    COORDINATION OF CARE: 10:46 PM-Discussed treatment plan which includes CT of abdomen, CBC panel, CMP, UA) with pt at bedside and pt agreed to plan.   Procedures (including critical care time)  Labs Reviewed  URINALYSIS, ROUTINE W REFLEX MICROSCOPIC - Abnormal; Notable for the following:    Color, Urine AMBER (*)    Specific Gravity, Urine >1.030 (*)    Bilirubin Urine SMALL (*)    Ketones, ur TRACE (*)    All other components within normal limits  PREGNANCY, URINE   No results found. 1. Abdominal pain     MDM  27yF with RLQ pain. CT as above. Doubt appendicitis, early or otherwise. Pt recently seen for same. Lied to ED staff concerning responsible ride home. Eloped from ED and seen driving home after getting narcotics despite telling staff she had a ride. No additional pain medications given w/o objective pathology.  I personally preformed the services scribed in my presence. The recorded information has been reviewed is accurate. Raeford Razor, MD.    Raeford Razor, MD 04/20/13 [phone]

## 2013-04-22 ENCOUNTER — Emergency Department (HOSPITAL_COMMUNITY): Payer: Medicaid Other

## 2013-04-22 ENCOUNTER — Emergency Department (HOSPITAL_COMMUNITY)
Admission: EM | Admit: 2013-04-22 | Discharge: 2013-04-22 | Disposition: A | Discharge status: Home / Self Care | Payer: Medicaid Other | Attending: Emergency Medicine | Admitting: Emergency Medicine

## 2013-04-22 ENCOUNTER — Encounter (HOSPITAL_COMMUNITY): Payer: Self-pay | Admitting: Emergency Medicine

## 2013-04-22 DIAGNOSIS — X500XXA Overexertion from strenuous movement or load, initial encounter: Secondary | ICD-10-CM | POA: Insufficient documentation

## 2013-04-22 DIAGNOSIS — S93401A Sprain of unspecified ligament of right ankle, initial encounter: Secondary | ICD-10-CM

## 2013-04-22 DIAGNOSIS — Y939 Activity, unspecified: Secondary | ICD-10-CM | POA: Insufficient documentation

## 2013-04-22 DIAGNOSIS — S8990XA Unspecified injury of unspecified lower leg, initial encounter: Secondary | ICD-10-CM | POA: Insufficient documentation

## 2013-04-22 DIAGNOSIS — R296 Repeated falls: Secondary | ICD-10-CM | POA: Insufficient documentation

## 2013-04-22 DIAGNOSIS — F17200 Nicotine dependence, unspecified, uncomplicated: Secondary | ICD-10-CM | POA: Insufficient documentation

## 2013-04-22 DIAGNOSIS — Z8659 Personal history of other mental and behavioral disorders: Secondary | ICD-10-CM | POA: Insufficient documentation

## 2013-04-22 DIAGNOSIS — Y9289 Other specified places as the place of occurrence of the external cause: Secondary | ICD-10-CM | POA: Insufficient documentation

## 2013-04-22 DIAGNOSIS — M25561 Pain in right knee: Secondary | ICD-10-CM

## 2013-04-22 DIAGNOSIS — Z8742 Personal history of other diseases of the female genital tract: Secondary | ICD-10-CM | POA: Insufficient documentation

## 2013-04-22 DIAGNOSIS — Z8679 Personal history of other diseases of the circulatory system: Secondary | ICD-10-CM | POA: Insufficient documentation

## 2013-04-22 DIAGNOSIS — S93409A Sprain of unspecified ligament of unspecified ankle, initial encounter: Secondary | ICD-10-CM | POA: Insufficient documentation

## 2013-04-22 MED ORDER — HYDROCODONE-ACETAMINOPHEN 5-325 MG PO TABS: ORAL_TABLET | ORAL | Status: DC

## 2013-04-22 NOTE — ED Notes (Signed)
Pt reports she stepped in a hole in the yard yesterday, turned her ankle and felt her knee pop. Pt states pain in her knee has not resolved.

## 2013-04-26 NOTE — ED Provider Notes (Signed)
Medical screening examination/treatment/procedure(s) were performed by non-physician practitioner and as supervising physician I was immediately available for consultation/collaboration.   Dameka Younker L Namira Rosekrans, MD 04/26/13 1515 

## 2013-04-26 NOTE — ED Provider Notes (Signed)
CSN: [id number]     Arrival date & time 04/22/13  1140 History   First MD Initiated Contact with Patient 04/22/13 1158     Chief Complaint  Patient presents with  . Leg Pain   (Consider location/radiation/quality/duration/timing/severity/associated sxs/prior Treatment) Patient is a 27 y.o. female presenting with leg pain. The history is provided by the patient.  Leg Pain Location:  Ankle and knee Time since incident:  1 day Injury: yes   Mechanism of injury: fall   Fall:    Fall occurred: stepped in a hole in the yard and fell, twisting her knee and ankle.   Impact surface:  Dirt   Point of impact:  Knees   Entrapped after fall: no   Knee location:  R knee Ankle location:  R ankle Pain details:    Quality:  Aching and throbbing   Radiates to:  Does not radiate   Severity:  Moderate   Onset quality:  Sudden   Timing:  Constant   Progression:  Worsening Chronicity: knee pain is recurrent, ankle pain is new. Dislocation: no   Foreign body present:  No foreign bodies Relieved by:  Nothing Worsened by:  Activity, bearing weight and flexion Associated symptoms: swelling   Associated symptoms: no back pain, no decreased ROM, no fever, no neck pain, no numbness and no tingling     Past Medical History  Diagnosis Date  . Ovarian cyst   . Anxiety   . Bradycardia     pt says was seen at [facility] for bradycardia but was never put on any medication for it.   Past Surgical History  Procedure Laterality Date  . Cesarean section      x2  . Cholecystectomy N/A 03/03/2013    Procedure: LAPAROSCOPIC CHOLECYSTECTOMY;  Surgeon: Dalia Heading, MD;  Location: [facility];  Service: General;  Laterality: N/A;   History reviewed. No pertinent family history. History  Substance Use Topics  . Smoking status: Current Every Day Smoker -- 0.50 packs/day for 3 years    Types: Cigarettes  . Smokeless tobacco: Not on file  . Alcohol Use: No   OB History   Grav Para Term Preterm Abortions TAB SAB  Ect Mult Living   2 2 2       2      Review of Systems  Constitutional: Negative for fever and chills.  HENT: Negative for neck pain.   Genitourinary: Negative for dysuria and difficulty urinating.  Musculoskeletal: Positive for joint swelling and arthralgias. Negative for back pain.  Skin: Negative for color change and wound.  Neurological: Negative for weakness and numbness.  All other systems reviewed and are negative.    Allergies  Naprosyn  Home Medications   Current Outpatient Rx  Name  Route  Sig  Dispense  Refill  . acetaminophen (TYLENOL) 500 MG tablet   Oral   Take 1,000 mg by mouth every 6 (six) hours as needed for pain.         . medroxyPROGESTERone (DEPO-PROVERA) 150 MG/ML injection   Intramuscular   Inject 150 mg into the muscle every 3 (three) months.         Marland Kitchen HYDROcodone-acetaminophen (NORCO/VICODIN) 5-325 MG per tablet      Take one-two tabs po q 4-6 hrs prn pain   15 tablet   0   . oxyCODONE-acetaminophen (PERCOCET/ROXICET) 5-325 MG per tablet   Oral   Take 1 tablet by mouth every 4 (four) hours as needed for pain.   6  tablet   0    BP 115/63  Pulse 61  Temp(Src) 97.5 F (36.4 C) (Oral)  Ht 5\' 6"  (1.676 m)  Wt 220 lb (99.791 kg)  BMI 35.53 kg/m2  SpO2 100% Physical Exam  Nursing note and vitals reviewed. Constitutional: She is oriented to person, place, and time. She appears well-developed and well-nourished. No distress.  Cardiovascular: Normal rate, regular rhythm, normal heart sounds and intact distal pulses.   Pulmonary/Chest: Effort normal and breath sounds normal.  Musculoskeletal: She exhibits tenderness.       Right knee: She exhibits decreased range of motion and swelling. She exhibits no effusion, no ecchymosis, no deformity, no laceration, no erythema and normal alignment. Tenderness found. Lateral joint line tenderness noted.       Right ankle: She exhibits swelling. She exhibits normal range of motion, no deformity, no  laceration and normal pulse. Tenderness. Medial malleolus tenderness found. No CF ligament, no posterior TFL, no head of 5th metatarsal and no proximal fibula tenderness found. Achilles tendon normal.       Legs:      Feet:  ttp of the anterolateral right knee. Mild STS present. No erythema, effusion, or step-off deformity.  DP pulse brisk, distal sensation intact. Lateral right ankle also ttp, slight STS present.  No bony deformity  Calf is soft and NT.  Neurological: She is alert and oriented to person, place, and time. She exhibits normal muscle tone. Coordination normal.  Skin: Skin is warm and dry. No erythema.    ED Course  Procedures (including critical care time) Labs Review Labs Reviewed - No data to display Imaging Review Dg Ankle Complete Right  04/22/2013   *RADIOLOGY REPORT*  Clinical Data: Leg pain and swelling.  RIGHT ANKLE - COMPLETE 3+ VIEW  Comparison: No priors.  Findings: Three views of the right ankle demonstrate no acute displaced fracture, subluxation, dislocation, joint or soft tissue abnormality.  IMPRESSION: 1.  No acute radiographic abnormality of the right ankle.   Original Report Authenticated By: Trudie Reed, M.D.   Knee Complete 4 Views Right  04/22/2013   *RADIOLOGY REPORT*  Clinical Data: History of trauma from a fall.  Pain in the lateral aspect of the patella.  RIGHT KNEE - COMPLETE 4+ VIEW  Comparison: No priors.  Findings: Four views of the right knee demonstrate no acute displaced fracture, subluxation, dislocation, joint or soft tissue abnormality.  IMPRESSION: 1.  No acute radiographic abnormality of the right knee.   Original Report Authenticated By: Trudie Reed, M.D.    MDM   1. Knee pain, acute, right   2. Ankle sprain, right, initial encounter    Knee immob applied, pain improved.  Remains NV intact.  Crutches also given.    VSS.  Pt appears stable for discharge, referral info given for ortho.      Delise Simenson L. Amelita Risinger, PA-C 04/26/13  1233

## 2013-05-13 ENCOUNTER — Emergency Department (HOSPITAL_COMMUNITY)
Admission: EM | Admit: 2013-05-13 | Discharge: 2013-05-13 | Disposition: A | Discharge status: Home / Self Care | Payer: Medicaid Other | Attending: Emergency Medicine | Admitting: Emergency Medicine

## 2013-05-13 ENCOUNTER — Encounter (HOSPITAL_COMMUNITY): Payer: Self-pay

## 2013-05-13 DIAGNOSIS — Z8742 Personal history of other diseases of the female genital tract: Secondary | ICD-10-CM | POA: Insufficient documentation

## 2013-05-13 DIAGNOSIS — Z8679 Personal history of other diseases of the circulatory system: Secondary | ICD-10-CM | POA: Insufficient documentation

## 2013-05-13 DIAGNOSIS — R509 Fever, unspecified: Secondary | ICD-10-CM | POA: Insufficient documentation

## 2013-05-13 DIAGNOSIS — K029 Dental caries, unspecified: Secondary | ICD-10-CM | POA: Insufficient documentation

## 2013-05-13 DIAGNOSIS — Z8659 Personal history of other mental and behavioral disorders: Secondary | ICD-10-CM | POA: Insufficient documentation

## 2013-05-13 DIAGNOSIS — F17200 Nicotine dependence, unspecified, uncomplicated: Secondary | ICD-10-CM | POA: Insufficient documentation

## 2013-05-13 MED ORDER — AMOXICILLIN 500 MG PO CAPS: 500.0000 mg | ORAL_CAPSULE | Freq: Three times a day (TID) | ORAL | Status: DC

## 2013-05-13 MED ORDER — HYDROCODONE-ACETAMINOPHEN 5-325 MG PO TABS: 1.0000 | ORAL_TABLET | ORAL | Status: DC | PRN

## 2013-05-13 NOTE — ED Provider Notes (Signed)
CSN: [id number]     Arrival date & time 05/13/13  0900 History   First MD Initiated Contact with Patient 05/13/13 [phone]     Chief Complaint  Patient presents with  . Dental Pain   (Consider location/radiation/quality/duration/timing/severity/associated sxs/prior Treatment) Patient is a 27 y.o. female presenting with tooth pain. The history is provided by the patient.  Dental Pain Location:  Lower Lower teeth location:  18/LL 2nd molar Quality:  Throbbing and constant Severity:  Severe Onset quality:  Gradual Duration:  2 days Timing:  Constant Progression:  Worsening Chronicity:  New Context: dental caries   Relieved by:  Nothing Worsened by:  Hot food/drink and cold food/drink Ineffective treatments:  Acetaminophen Associated symptoms: fever   Associated symptoms: no congestion, no headaches and no neck pain   Risk factors: smoking    Valerie Robinson is a 27 y.o. female who presents to the ED with dental pain x 2 days. She called her dentist but he could not see her so she came in for the pain. She reports having fever last night.    Past Medical History  Diagnosis Date  . Ovarian cyst   . Anxiety   . Bradycardia     pt says was seen at [facility] for bradycardia but was never put on any medication for it.   Past Surgical History  Procedure Laterality Date  . Cesarean section      x2  . Cholecystectomy N/A 03/03/2013    Procedure: LAPAROSCOPIC CHOLECYSTECTOMY;  Surgeon: Dalia Heading, MD;  Location: [facility];  Service: General;  Laterality: N/A;   No family history on file. History  Substance Use Topics  . Smoking status: Current Every Day Smoker -- 0.50 packs/day for 3 years    Types: Cigarettes  . Smokeless tobacco: Not on file  . Alcohol Use: No   OB History   Grav Para Term Preterm Abortions TAB SAB Ect Mult Living   2 2 2       2      Review of Systems  Constitutional: Positive for fever and chills.  HENT: Positive for dental problem. Negative for congestion and  neck pain.   Respiratory: Negative for shortness of breath.   Gastrointestinal: Negative for nausea, vomiting and abdominal pain.  Musculoskeletal: Negative for back pain.  Skin: Negative for rash.  Neurological: Negative for dizziness and headaches.  Psychiatric/Behavioral: The patient is not nervous/anxious.     Allergies  Tramadol and Naprosyn  Home Medications   Current Outpatient Rx  Name  Route  Sig  Dispense  Refill  . acetaminophen (TYLENOL) 500 MG tablet   Oral   Take 1,000 mg by mouth every 6 (six) hours as needed for pain.         . medroxyPROGESTERone (DEPO-PROVERA) 150 MG/ML injection   Intramuscular   Inject 150 mg into the muscle every 3 (three) months.          BP 109/70  Pulse 59  Temp(Src) 97.5 F (36.4 C) (Oral)  Resp 20  Ht 5\' 6"  (1.676 m)  Wt 220 lb (99.791 kg)  BMI 35.53 kg/m2  SpO2 99% Physical Exam  Nursing note and vitals reviewed. Constitutional: She is oriented to person, place, and time. She appears well-developed and well-nourished. No distress.  HENT:  Head: Atraumatic.  Nose: Nose normal.  Mouth/Throat: Uvula is midline, oropharynx is clear and moist and mucous membranes are normal. Dental caries present.    Eyes: Conjunctivae and EOM are normal.  Neck:  Neck supple.  Cardiovascular: Normal rate and regular rhythm.   Pulmonary/Chest: Effort normal and breath sounds normal.  Abdominal: Soft. There is no tenderness.  Musculoskeletal: Normal range of motion.  Neurological: She is alert and oriented to person, place, and time. No cranial nerve deficit.  Skin: Skin is warm and dry.  Psychiatric: She has a normal mood and affect. Her behavior is normal.    ED Course  Procedures   MDM  27 y.o. female with dental pain due to caries. Will treat with antibiotics. Will give Hydrocodone 5/325,  3 tablets until the antibiotics starts to work. After that she can take tylenol for pain.  Patient allergic to Naprosyn and Tramadol.   Patient  stable for discharge home without any immediate complications. Vital signs stable, she is afebrile. She does have a dentis to follow up with.  Discussed with the patient and all questioned fully answered.   Medication List    TAKE these medications       amoxicillin 500 MG capsule  Commonly known as:  AMOXIL  Take 1 capsule (500 mg total) by mouth 3 (three) times daily.     HYDROcodone-acetaminophen 5-325 MG per tablet  Commonly known as:  NORCO/VICODIN  Take 1 tablet by mouth every 4 (four) hours as needed.      ASK your doctor about these medications       acetaminophen 500 MG tablet  Commonly known as:  TYLENOL  Take 1,000 mg by mouth every 6 (six) hours as needed for pain.     medroxyPROGESTERone 150 MG/ML injection  Commonly known as:  DEPO-PROVERA  Inject 150 mg into the muscle every 3 (three) months.         [address] [place], [place] 05/13/13 [phone]

## 2013-05-13 NOTE — ED Provider Notes (Signed)
Medical screening examination/treatment/procedure(s) were performed by non-physician practitioner and as supervising physician I was immediately available for consultation/collaboration.   Lyanne Co, MD 05/13/13 1118

## 2013-05-13 NOTE — ED Notes (Signed)
Pt c/o toothache x 2 days 

## 2013-05-27 ENCOUNTER — Emergency Department (HOSPITAL_COMMUNITY)
Admission: EM | Admit: 2013-05-27 | Discharge: 2013-05-27 | Disposition: A | Discharge status: Home / Self Care | Payer: Medicaid Other | Attending: Emergency Medicine | Admitting: Emergency Medicine

## 2013-05-27 ENCOUNTER — Encounter (HOSPITAL_COMMUNITY): Payer: Self-pay | Admitting: *Deleted

## 2013-05-27 DIAGNOSIS — X58XXXA Exposure to other specified factors, initial encounter: Secondary | ICD-10-CM | POA: Insufficient documentation

## 2013-05-27 DIAGNOSIS — Z8659 Personal history of other mental and behavioral disorders: Secondary | ICD-10-CM | POA: Insufficient documentation

## 2013-05-27 DIAGNOSIS — S058X9A Other injuries of unspecified eye and orbit, initial encounter: Secondary | ICD-10-CM | POA: Insufficient documentation

## 2013-05-27 DIAGNOSIS — F17200 Nicotine dependence, unspecified, uncomplicated: Secondary | ICD-10-CM | POA: Insufficient documentation

## 2013-05-27 DIAGNOSIS — Y929 Unspecified place or not applicable: Secondary | ICD-10-CM | POA: Insufficient documentation

## 2013-05-27 DIAGNOSIS — Z792 Long term (current) use of antibiotics: Secondary | ICD-10-CM | POA: Insufficient documentation

## 2013-05-27 DIAGNOSIS — S0501XA Injury of conjunctiva and corneal abrasion without foreign body, right eye, initial encounter: Secondary | ICD-10-CM

## 2013-05-27 DIAGNOSIS — R51 Headache: Secondary | ICD-10-CM | POA: Insufficient documentation

## 2013-05-27 DIAGNOSIS — Y9389 Activity, other specified: Secondary | ICD-10-CM | POA: Insufficient documentation

## 2013-05-27 DIAGNOSIS — Z8742 Personal history of other diseases of the female genital tract: Secondary | ICD-10-CM | POA: Insufficient documentation

## 2013-05-27 MED ORDER — FLUORESCEIN SODIUM 1 MG OP STRP: 1.0000 | ORAL_STRIP | Freq: Once | OPHTHALMIC | Status: AC

## 2013-05-27 MED ORDER — TOBRAMYCIN 0.3 % OP SOLN
1.0000 [drp] | Freq: Once | OPHTHALMIC | Status: AC
  Administered 2013-05-27: 1 [drp] via OPHTHALMIC
  Filled 2013-05-27: qty 5

## 2013-05-27 MED ORDER — TETRACAINE HCL 0.5 % OP SOLN
OPHTHALMIC | Status: AC
Start: 2013-05-27 — End: 2013-05-27
  Administered 2013-05-27: 1 [drp] via OPHTHALMIC
  Filled 2013-05-27: qty 2

## 2013-05-27 MED ORDER — FLUORESCEIN SODIUM 1 MG OP STRP
ORAL_STRIP | OPHTHALMIC | Status: AC
  Administered 2013-05-27: 1 via OPHTHALMIC
  Filled 2013-05-27: qty 1

## 2013-05-27 MED ORDER — TETRACAINE HCL 0.5 % OP SOLN: 1.0000 [drp] | Freq: Once | OPHTHALMIC | Status: AC

## 2013-05-27 MED ORDER — HYDROCODONE-ACETAMINOPHEN 5-325 MG PO TABS
1.0000 | ORAL_TABLET | Freq: Once | ORAL | Status: AC
  Administered 2013-05-27: 1 via ORAL
  Filled 2013-05-27: qty 1

## 2013-05-27 MED ORDER — HYDROCODONE-ACETAMINOPHEN 5-325 MG PO TABS: ORAL_TABLET | ORAL | Status: DC

## 2013-05-27 NOTE — ED Notes (Signed)
Limb hit patient in R eye yesterday.  Pain began last night, continuing today.  Vision blurred in R eye. HA.

## 2013-05-27 NOTE — ED Provider Notes (Signed)
CSN: [id number]     Arrival date & time 05/27/13  1510 History   First MD Initiated Contact with Patient 05/27/13 1548     Chief Complaint  Patient presents with  . Eye Pain   (Consider location/radiation/quality/duration/timing/severity/associated sxs/prior Treatment) HPI Comments: Valerie Robinson is a 27 y.o. female who presents to the Emergency Department complaining of pain to the right eye.  States that a limb of a pine tree swiped across her right eye on the day prior to ED arrival.  C/o pain, photophobia, headache , blurred vision of right eye and excessive tearing of the eye.  Pt denies contact or eyeglass usage, neck pain or loss of vision.    Patient is a 27 y.o. female presenting with eye pain. The history is provided by the patient.  Eye Pain This is a new problem. The current episode started yesterday. The problem occurs constantly. The problem has been unchanged. Associated symptoms include headaches. Pertinent negatives include no coughing, fever, nausea, neck pain, numbness, rash, sore throat, vomiting or weakness. Exacerbated by: blinking and bright lights. Treatments tried: rinsing of the right eye. The treatment provided no relief.    Past Medical History  Diagnosis Date  . Ovarian cyst   . Anxiety   . Bradycardia     pt says was seen at [facility] for bradycardia but was never put on any medication for it.   Past Surgical History  Procedure Laterality Date  . Cesarean section      x2  . Cholecystectomy N/A 03/03/2013    Procedure: LAPAROSCOPIC CHOLECYSTECTOMY;  Surgeon: Dalia Heading, MD;  Location: [facility];  Service: General;  Laterality: N/A;   History reviewed. No pertinent family history. History  Substance Use Topics  . Smoking status: Current Every Day Smoker -- 0.50 packs/day for 3 years    Types: Cigarettes  . Smokeless tobacco: Not on file  . Alcohol Use: No   OB History   Grav Para Term Preterm Abortions TAB SAB Ect Mult Living   2 2 2       2       Review of Systems  Constitutional: Negative for fever, activity change and appetite change.  HENT: Negative for sore throat and neck pain.   Eyes: Positive for photophobia, pain and visual disturbance. Negative for discharge and redness.  Respiratory: Negative for cough and shortness of breath.   Gastrointestinal: Negative for nausea and vomiting.  Skin: Negative for rash.  Neurological: Positive for headaches. Negative for dizziness, weakness, light-headedness and numbness.  All other systems reviewed and are negative.    Allergies  Tramadol and Naprosyn  Home Medications   Current Outpatient Rx  Name  Route  Sig  Dispense  Refill  . acetaminophen (TYLENOL) 500 MG tablet   Oral   Take 1,000 mg by mouth every 6 (six) hours as needed for pain.         Marland Kitchen amoxicillin (AMOXIL) 500 MG capsule   Oral   Take 1 capsule (500 mg total) by mouth 3 (three) times daily.   21 capsule   0   . HYDROcodone-acetaminophen (NORCO/VICODIN) 5-325 MG per tablet   Oral   Take 1 tablet by mouth every 4 (four) hours as needed.   3 tablet   0   . medroxyPROGESTERone (DEPO-PROVERA) 150 MG/ML injection   Intramuscular   Inject 150 mg into the muscle every 3 (three) months.          BP 99/60  Pulse 70  Temp(Src) 97.9 F (36.6 C) (Oral)  Resp 20  Ht 5\' 6"  (1.676 m)  Wt 203 lb (92.08 kg)  BMI 32.78 kg/m2  SpO2 100% Physical Exam  Nursing note and vitals reviewed. Constitutional: She is oriented to person, place, and time. She appears well-developed and well-nourished. No distress.  HENT:  Head: Normocephalic and atraumatic.  Mouth/Throat: Oropharynx is clear and moist.  Eyes: EOM and lids are normal. Pupils are equal, round, and reactive to light. Lids are everted and swept, no foreign bodies found. Right eye exhibits no chemosis and no discharge. No foreign body present in the right eye. Left eye exhibits no discharge. Right conjunctiva is injected.  Fundoscopic exam:      The right  eye shows no papilledema.  Slit lamp exam:      The right eye shows corneal abrasion and fluorescein uptake. The right eye shows no corneal ulcer, no foreign body and no hyphema.    Neck: Normal range of motion. Neck supple.  Cardiovascular: Normal rate and regular rhythm.   Pulmonary/Chest: Effort normal and breath sounds normal. No respiratory distress.  Musculoskeletal: Normal range of motion.  Lymphadenopathy:    She has no cervical adenopathy.  Neurological: She is alert and oriented to person, place, and time. She exhibits normal muscle tone. Coordination normal.  Skin: Skin is warm and dry.  Psychiatric: She has a normal mood and affect.    ED Course  Procedures (including critical care time) Labs Review Labs Reviewed - No data to display Imaging Review No results found.  MDM    Visual Acuity - Bilateral Distance: 20/10 ; R Distance: 20/30 ; L Distance: 20/30  Right eye was irrigated with saline by nursing  Small corneal abrasion to lateral right eye.  Globe intact, no facial abrasions, FB, hyphema  or tenderness.  Pain improved after ED stay and treatment.  Dispensed tobramycin drops, prescribed vicodin # 8.  Pt agrees to f/u with Dr. Lita Mains or return here if sx's worsening     Oluwatosin Bracy L. Mahoganie Basher, PA-C 05/28/13 1400

## 2013-05-28 NOTE — ED Provider Notes (Signed)
Medical screening examination/treatment/procedure(s) were performed by non-physician practitioner and as supervising physician I was immediately available for consultation/collaboration.  Donnetta Hutching, MD 05/28/13 [phone]

## 2013-06-02 ENCOUNTER — Encounter (HOSPITAL_COMMUNITY): Payer: Self-pay | Admitting: Emergency Medicine

## 2013-06-02 ENCOUNTER — Emergency Department (HOSPITAL_COMMUNITY)
Admission: EM | Admit: 2013-06-02 | Discharge: 2013-06-03 | Disposition: A | Discharge status: Home / Self Care | Payer: Medicaid Other | Attending: Emergency Medicine | Admitting: Emergency Medicine

## 2013-06-02 DIAGNOSIS — Z23 Encounter for immunization: Secondary | ICD-10-CM | POA: Insufficient documentation

## 2013-06-02 DIAGNOSIS — S61219A Laceration without foreign body of unspecified finger without damage to nail, initial encounter: Secondary | ICD-10-CM

## 2013-06-02 DIAGNOSIS — Z8659 Personal history of other mental and behavioral disorders: Secondary | ICD-10-CM | POA: Insufficient documentation

## 2013-06-02 DIAGNOSIS — W268XXA Contact with other sharp object(s), not elsewhere classified, initial encounter: Secondary | ICD-10-CM | POA: Insufficient documentation

## 2013-06-02 DIAGNOSIS — F17200 Nicotine dependence, unspecified, uncomplicated: Secondary | ICD-10-CM | POA: Insufficient documentation

## 2013-06-02 DIAGNOSIS — Y929 Unspecified place or not applicable: Secondary | ICD-10-CM | POA: Insufficient documentation

## 2013-06-02 DIAGNOSIS — Z8679 Personal history of other diseases of the circulatory system: Secondary | ICD-10-CM | POA: Insufficient documentation

## 2013-06-02 DIAGNOSIS — Y9389 Activity, other specified: Secondary | ICD-10-CM | POA: Insufficient documentation

## 2013-06-02 DIAGNOSIS — S61209A Unspecified open wound of unspecified finger without damage to nail, initial encounter: Secondary | ICD-10-CM | POA: Insufficient documentation

## 2013-06-02 DIAGNOSIS — Z8742 Personal history of other diseases of the female genital tract: Secondary | ICD-10-CM | POA: Insufficient documentation

## 2013-06-02 MED ORDER — TETANUS-DIPHTH-ACELL PERTUSSIS 5-2.5-18.5 LF-MCG/0.5 IM SUSP
0.5000 mL | Freq: Once | INTRAMUSCULAR | Status: AC
  Administered 2013-06-02: 0.5 mL via INTRAMUSCULAR
  Filled 2013-06-02: qty 0.5

## 2013-06-02 MED ORDER — SULFAMETHOXAZOLE-TRIMETHOPRIM 800-160 MG PO TABS: 1.0000 | ORAL_TABLET | Freq: Two times a day (BID) | ORAL | Status: DC

## 2013-06-02 NOTE — ED Provider Notes (Signed)
CSN: [id number]     Arrival date & time 06/02/13  2230 History   First MD Initiated Contact with Patient 06/02/13 2244     Chief Complaint  Patient presents with  . Finger Injury   (Consider location/radiation/quality/duration/timing/severity/associated sxs/prior Treatment) HPI Comments: Valerie Robinson is a 27 y.o. female who presents to the Emergency Department complaining of rash to the proximal left index finger that occurred yesterday afternoon. She states she cut her finger on a piece of metal from a door. She cleaned the wound initially with peroxide and soap and water. She reports having" soreness" to her finger with movement and she also is complains of swelling to her finger. She denies drainage, continued bleeding, numbness, or red streaks. Patient is unsure of her last  Tetanus vaccination  The history is provided by the patient.    Past Medical History  Diagnosis Date  . Ovarian cyst   . Anxiety   . Bradycardia     pt says was seen at [facility] for bradycardia but was never put on any medication for it.   Past Surgical History  Procedure Laterality Date  . Cesarean section      x2  . Cholecystectomy N/A 03/03/2013    Procedure: LAPAROSCOPIC CHOLECYSTECTOMY;  Surgeon: Dalia Heading, MD;  Location: [facility];  Service: General;  Laterality: N/A;   History reviewed. No pertinent family history. History  Substance Use Topics  . Smoking status: Current Every Day Smoker -- 0.50 packs/day for 3 years    Types: Cigarettes  . Smokeless tobacco: Not on file  . Alcohol Use: No   OB History   Grav Para Term Preterm Abortions TAB SAB Ect Mult Living   2 2 2       2      Review of Systems  Constitutional: Negative for fever and chills.  Musculoskeletal: Positive for arthralgias and joint swelling.  Skin: Positive for wound. Negative for color change.  Neurological: Negative for dizziness, weakness and numbness.  Hematological: Does not bruise/bleed easily.  All other systems  reviewed and are negative.    Allergies  Tramadol and Naprosyn  Home Medications   Current Outpatient Rx  Name  Route  Sig  Dispense  Refill  . medroxyPROGESTERone (DEPO-PROVERA) 150 MG/ML injection   Intramuscular   Inject 150 mg into the muscle every 3 (three) months.         . sulfamethoxazole-trimethoprim (SEPTRA DS) 800-160 MG per tablet   Oral   Take 1 tablet by mouth 2 (two) times daily. For 10 days   20 tablet   0    BP 104/64  Pulse 89  Temp(Src) 97.7 F (36.5 C) (Oral)  Resp 18  Ht 5\' 6"  (1.676 m)  Wt 203 lb (92.08 kg)  BMI 32.78 kg/m2  SpO2 100% Physical Exam  Nursing note and vitals reviewed. Constitutional: She is oriented to person, place, and time. She appears well-developed and well-nourished. No distress.  HENT:  Head: Normocephalic and atraumatic.  Cardiovascular: Normal rate, regular rhythm, normal heart sounds and intact distal pulses.   No murmur heard. Pulmonary/Chest: Effort normal and breath sounds normal. No respiratory distress.  Musculoskeletal: Normal range of motion. She exhibits tenderness. She exhibits no edema.       Left hand: She exhibits tenderness and laceration. She exhibits normal range of motion, normal two-point discrimination, normal capillary refill and no swelling. Normal sensation noted. Normal strength noted. She exhibits no finger abduction and no thumb/finger opposition.  Hands:  laceration to the proximal aspect of the left index finger. Laceration appears superficial. No edema, erythema or lymphangitis. Distal sensation is intact, cap refill less than 2 seconds. Radial pulse is brisk. Patient has full range of motion of the finger with reproducible pain on palmar flexion.  Neurological: She is alert and oriented to person, place, and time. She exhibits normal muscle tone. Coordination normal.  Skin: Skin is warm.    ED Course  Procedures (including critical care time) Labs Review Labs Reviewed - No data to  display Imaging Review No results found.  EKG Interpretation   None       MDM   1. Laceration of finger with delay in treatment, initial encounter     3 cm Laceration to palmar aspect of the proximal left index finger, wound is greater than 24 hrs old, so will allow to heal by secondary intent.  No clinical sx's of infection at this time.  Appears to be healing well.  Will update Td and prescribe bactrim.  Pt agrees to keep bandaged and close f/u with her PMD if needed   Emry Barbato L. Trisha Mangle, PA-C 06/02/13 2335

## 2013-06-02 NOTE — ED Notes (Signed)
Patient reports cut left pointer finger on piece of metal from doorknob yesterday. States finger is swelling and hurting.

## 2013-06-03 NOTE — ED Provider Notes (Signed)
Medical screening examination/treatment/procedure(s) were performed by non-physician practitioner and as supervising physician I was immediately available for consultation/collaboration.    Vida Roller, MD 06/03/13 [phone]

## 2013-06-08 ENCOUNTER — Emergency Department (HOSPITAL_COMMUNITY)
Admission: EM | Admit: 2013-06-08 | Discharge: 2013-06-09 | Disposition: A | Discharge status: Home / Self Care | Payer: Medicaid Other | Attending: Emergency Medicine | Admitting: Emergency Medicine

## 2013-06-08 ENCOUNTER — Encounter (HOSPITAL_COMMUNITY): Payer: Self-pay | Admitting: Emergency Medicine

## 2013-06-08 ENCOUNTER — Emergency Department (HOSPITAL_COMMUNITY): Payer: Medicaid Other

## 2013-06-08 DIAGNOSIS — R109 Unspecified abdominal pain: Secondary | ICD-10-CM | POA: Insufficient documentation

## 2013-06-08 DIAGNOSIS — R51 Headache: Secondary | ICD-10-CM | POA: Insufficient documentation

## 2013-06-08 DIAGNOSIS — R6883 Chills (without fever): Secondary | ICD-10-CM | POA: Insufficient documentation

## 2013-06-08 DIAGNOSIS — R112 Nausea with vomiting, unspecified: Secondary | ICD-10-CM | POA: Insufficient documentation

## 2013-06-08 DIAGNOSIS — Z8742 Personal history of other diseases of the female genital tract: Secondary | ICD-10-CM | POA: Insufficient documentation

## 2013-06-08 DIAGNOSIS — M549 Dorsalgia, unspecified: Secondary | ICD-10-CM | POA: Insufficient documentation

## 2013-06-08 DIAGNOSIS — R300 Dysuria: Secondary | ICD-10-CM | POA: Insufficient documentation

## 2013-06-08 DIAGNOSIS — F17200 Nicotine dependence, unspecified, uncomplicated: Secondary | ICD-10-CM | POA: Insufficient documentation

## 2013-06-08 DIAGNOSIS — Z87442 Personal history of urinary calculi: Secondary | ICD-10-CM | POA: Insufficient documentation

## 2013-06-08 DIAGNOSIS — Z8659 Personal history of other mental and behavioral disorders: Secondary | ICD-10-CM | POA: Insufficient documentation

## 2013-06-08 DIAGNOSIS — Z3202 Encounter for pregnancy test, result negative: Secondary | ICD-10-CM | POA: Insufficient documentation

## 2013-06-08 DIAGNOSIS — Z8679 Personal history of other diseases of the circulatory system: Secondary | ICD-10-CM | POA: Insufficient documentation

## 2013-06-08 DIAGNOSIS — Z9889 Other specified postprocedural states: Secondary | ICD-10-CM | POA: Insufficient documentation

## 2013-06-08 HISTORY — DX: Disorder of kidney and ureter, unspecified: N28.9

## 2013-06-08 LAB — BASIC METABOLIC PANEL
CO2: 25 mEq/L (ref 19–32)
Chloride: 100 mEq/L (ref 96–112)
Creatinine, Ser: 0.73 mg/dL (ref 0.50–1.10)
GFR calc Af Amer: >90 mL/min (ref >90)
Potassium: 3.5 mEq/L (ref 3.5–5.1)
Sodium: 137 mEq/L (ref 135–145)

## 2013-06-08 LAB — URINALYSIS, ROUTINE W REFLEX MICROSCOPIC
Hgb urine dipstick: NEGATIVE
Leukocytes, UA: NEGATIVE
Nitrite: NEGATIVE
Protein, ur: NEGATIVE mg/dL
Specific Gravity, Urine: 1.02 (ref 1.005–1.030)
Urobilinogen, UA: 0.2 mg/dL (ref 0.0–1.0)

## 2013-06-08 LAB — CBC WITH DIFFERENTIAL/PLATELET
Basophils Absolute: 0 10*3/uL (ref 0.0–0.1)
Basophils Relative: 0 % (ref 0–1)
Eosinophils Absolute: 0.3 10*3/uL (ref 0.0–0.7)
Lymphocytes Relative: 33 % (ref 12–46)
MCH: 33 pg (ref 26.0–34.0)
MCHC: 35 g/dL (ref 30.0–36.0)
Monocytes Absolute: 0.7 10*3/uL (ref 0.1–1.0)
Neutro Abs: 7.2 10*3/uL (ref 1.7–7.7)
Neutrophils Relative %: 60 % (ref 43–77)
Platelets: 262 10*3/uL (ref 150–400)
RDW: 12.2 % (ref 11.5–15.5)
WBC: 12.1 10*3/uL — ABNORMAL HIGH (ref 4.0–10.5)

## 2013-06-08 LAB — PREGNANCY, URINE: Preg Test, Ur: NEGATIVE

## 2013-06-08 MED ORDER — ONDANSETRON HCL 4 MG/2ML IJ SOLN
4.0000 mg | Freq: Once | INTRAMUSCULAR | Status: AC
  Administered 2013-06-08: 4 mg via INTRAVENOUS
  Filled 2013-06-08: qty 2

## 2013-06-08 MED ORDER — SODIUM CHLORIDE 0.9 % IV BOLUS (SEPSIS): 250.0000 mL | Freq: Once | INTRAVENOUS | Status: DC

## 2013-06-08 MED ORDER — HYDROMORPHONE HCL PF 1 MG/ML IJ SOLN
1.0000 mg | Freq: Once | INTRAMUSCULAR | Status: AC
  Administered 2013-06-08: 1 mg via INTRAVENOUS
  Filled 2013-06-08: qty 1

## 2013-06-08 MED ORDER — SODIUM CHLORIDE 0.9 % IV SOLN: INTRAVENOUS | Status: DC

## 2013-06-08 NOTE — ED Notes (Signed)
Patient c/o right flank pain with nausea and vomiting since last night.  Patient states she has a history of kidney stones.

## 2013-06-08 NOTE — ED Provider Notes (Signed)
CSN: [id number]     Arrival date & time 06/08/13  2004 History  This chart was scribed for [name], MD by Danella Maiers, ED Scribe. This patient was seen in room [facility] and the patient's care was started at 9:28 PM.   Chief Complaint  Patient presents with  . Flank Pain   Patient is a 27 y.o. female presenting with flank pain. The history is provided by the patient. No language interpreter was used.  Flank Pain The current episode started yesterday. The problem occurs constantly. The problem has not changed since onset.Associated symptoms include abdominal pain and headaches. Pertinent negatives include no chest pain and no shortness of breath. She has tried nothing for the symptoms.   HPI Comments: Valerie Robinson is a 27 y.o. female with a h/o ovarian cysts and kidney stones who presents to the Emergency Department complaining of constant sharp right flank pain that starts in her back and radiates to the RLQ that started yesterday around 9pm. She rates the pain as a 9/10 severity. She also reports nausea, emesis, dysuria. She denies hematuria. Her LMP is unknown.  PCP- Dr. Shea Evans at [facility]  Past Medical History  Diagnosis Date  . Ovarian cyst   . Anxiety   . Bradycardia     pt says was seen at [facility] for bradycardia but was never put on any medication for it.  . Renal disorder    Past Surgical History  Procedure Laterality Date  . Cesarean section      x2  . Cholecystectomy N/A 03/03/2013    Procedure: LAPAROSCOPIC CHOLECYSTECTOMY;  Surgeon: Dalia Heading, MD;  Location: [facility];  Service: General;  Laterality: N/A;   No family history on file. History  Substance Use Topics  . Smoking status: Current Every Day Smoker -- 0.50 packs/day for 3 years    Types: Cigarettes  . Smokeless tobacco: Not on file  . Alcohol Use: No   OB History   Grav Para Term Preterm Abortions TAB SAB Ect Mult Living   2 2 2       2      Review of Systems  Constitutional: Positive for  chills. Negative for fever.  HENT: Negative for rhinorrhea and sore throat.   Eyes: Negative for visual disturbance.  Respiratory: Negative for cough and shortness of breath.   Cardiovascular: Negative for chest pain and leg swelling.  Gastrointestinal: Positive for nausea, vomiting and abdominal pain. Negative for diarrhea.  Genitourinary: Positive for dysuria and flank pain. Negative for hematuria, vaginal bleeding and vaginal discharge.  Musculoskeletal: Positive for back pain. Negative for neck pain.  Skin: Negative for rash.  Neurological: Positive for headaches.  Hematological: Does not bruise/bleed easily.  Psychiatric/Behavioral: Negative for confusion.  All other systems reviewed and are negative.    Allergies  Tramadol and Naprosyn  Home Medications   Current Outpatient Rx  Name  Route  Sig  Dispense  Refill  . medroxyPROGESTERone (DEPO-PROVERA) 150 MG/ML injection   Intramuscular   Inject 150 mg into the muscle every 3 (three) months.          BP 110/61  Pulse 87  Temp(Src) 98.5 F (36.9 C) (Oral)  Resp 18  Ht 5\' 6"  (1.676 m)  Wt 215 lb (97.523 kg)  BMI 34.72 kg/m2  SpO2 99% Physical Exam  Nursing note and vitals reviewed. Constitutional: She is oriented to person, place, and time. She appears well-developed and well-nourished. No distress.  HENT:  Head: Normocephalic and atraumatic.  Eyes: Conjunctivae and EOM are normal. No scleral icterus.  Neck: Neck supple. No tracheal deviation present.  Cardiovascular: Normal rate, regular rhythm and normal heart sounds.   Pulmonary/Chest: Effort normal and breath sounds normal. No respiratory distress. She has no wheezes.  Abdominal: Bowel sounds are normal. There is no tenderness.  No CVA tenderness.  Musculoskeletal: Normal range of motion. She exhibits no edema.  Neurological: She is alert and oriented to person, place, and time. No cranial nerve deficit. She exhibits normal muscle tone. Coordination normal.   Skin: Skin is warm and dry.  Psychiatric: She has a normal mood and affect. Her behavior is normal.    ED Course  Procedures (including critical care time) Medications  0.9 %  sodium chloride infusion (not administered)  sodium chloride 0.9 % bolus 250 mL (not administered)  ondansetron (ZOFRAN) injection 4 mg (4 mg Intravenous Given 06/08/13 2254)  HYDROmorphone (DILAUDID) injection 1 mg (1 mg Intravenous Given 06/08/13 2255)   DIAGNOSTIC STUDIES: Oxygen Saturation is 99% on RA, normal by my interpretation.    COORDINATION OF CARE: 10:06 PM- Discussed treatment plan with pt which includes abdominal CT, blood work, and pain meds. Pt agrees to plan.  Results for orders placed during the hospital encounter of 06/08/13  URINALYSIS, ROUTINE W REFLEX MICROSCOPIC      Result Value Range   Color, Urine YELLOW  YELLOW   APPearance CLEAR  CLEAR   Specific Gravity, Urine 1.020  1.005 - 1.030   pH 5.5  5.0 - 8.0   Glucose, UA NEGATIVE  NEGATIVE mg/dL   Hgb urine dipstick NEGATIVE  NEGATIVE   Bilirubin Urine NEGATIVE  NEGATIVE   Ketones, ur NEGATIVE  NEGATIVE mg/dL   Protein, ur NEGATIVE  NEGATIVE mg/dL   Urobilinogen, UA 0.2  0.0 - 1.0 mg/dL   Nitrite NEGATIVE  NEGATIVE   Leukocytes, UA NEGATIVE  NEGATIVE  PREGNANCY, URINE      Result Value Range   Preg Test, Ur NEGATIVE  NEGATIVE  CBC WITH DIFFERENTIAL      Result Value Range   WBC 12.1 (*) 4.0 - 10.5 K/uL   RBC 4.64  3.87 - 5.11 MIL/uL   Hemoglobin 15.3 (*) 12.0 - 15.0 g/dL   HCT 16.1  09.6 - 04.5 %   MCV 94.2  78.0 - 100.0 fL   MCH 33.0  26.0 - 34.0 pg   MCHC 35.0  30.0 - 36.0 g/dL   RDW 40.9  81.1 - 91.4 %   Platelets 262  150 - 400 K/uL   Neutrophils Relative % 60  43 - 77 %   Neutro Abs 7.2  1.7 - 7.7 K/uL   Lymphocytes Relative 33  12 - 46 %   Lymphs Abs 3.9  0.7 - 4.0 K/uL   Monocytes Relative 6  3 - 12 %   Monocytes Absolute 0.7  0.1 - 1.0 K/uL   Eosinophils Relative 2  0 - 5 %   Eosinophils Absolute 0.3  0.0 -  0.7 K/uL   Basophils Relative 0  0 - 1 %   Basophils Absolute 0.0  0.0 - 0.1 K/uL   No results found.   MDM   1. Flank pain    CT scan pending. If negative can be discharged home. Mild leukocytosis no significant anemia. Pregnancy test negative no evidence urinary tract infection no blood in the urine. Patient has been seen for bowel pain in the past and may have a history of some chronic abdominal  pain symptoms.    I personally performed the services described in this documentation, which was scribed in my presence. The recorded information has been reviewed and is accurate.    [name], MD 06/12/13 [phone]

## 2013-06-08 NOTE — ED Notes (Signed)
Attempted x 3 for IV access without success, CN to assess for access

## 2013-06-09 NOTE — ED Notes (Signed)
Discharge instructions given and reviewed with patient.  Patient verbalized understanding to take Tylenol 1000mg  every 6 hours as needed for pain.  Patient instructed to return for increasing pain.  Patient ambulatory; discharged home in good condition.

## 2013-06-09 NOTE — ED Provider Notes (Signed)
Care soon from Dr. Deretha Emory at shift change awaiting the results of a CT scan.. Patient is a 27 year old female presents with complaints of right flank pain. She has a mild white count of 12,000 and CT was ordered to rule out renal calculus and appendicitis. This was performed and was negative. She appears to be feeling better and I believe stable for discharge. She was reexamined and her abdomen remains benign.  Geoffery Lyons, MD 06/09/13 0030

## 2013-06-10 ENCOUNTER — Emergency Department (HOSPITAL_COMMUNITY)
Admission: EM | Admit: 2013-06-10 | Discharge: 2013-06-10 | Disposition: A | Discharge status: Home / Self Care | Payer: Medicaid Other | Attending: Emergency Medicine | Admitting: Emergency Medicine

## 2013-06-10 ENCOUNTER — Encounter (HOSPITAL_COMMUNITY): Payer: Self-pay | Admitting: Emergency Medicine

## 2013-06-10 DIAGNOSIS — R112 Nausea with vomiting, unspecified: Secondary | ICD-10-CM | POA: Insufficient documentation

## 2013-06-10 DIAGNOSIS — K922 Gastrointestinal hemorrhage, unspecified: Secondary | ICD-10-CM | POA: Insufficient documentation

## 2013-06-10 DIAGNOSIS — R6883 Chills (without fever): Secondary | ICD-10-CM | POA: Insufficient documentation

## 2013-06-10 DIAGNOSIS — R630 Anorexia: Secondary | ICD-10-CM | POA: Insufficient documentation

## 2013-06-10 DIAGNOSIS — R197 Diarrhea, unspecified: Secondary | ICD-10-CM | POA: Insufficient documentation

## 2013-06-10 DIAGNOSIS — R102 Pelvic and perineal pain: Secondary | ICD-10-CM

## 2013-06-10 DIAGNOSIS — Z8659 Personal history of other mental and behavioral disorders: Secondary | ICD-10-CM | POA: Insufficient documentation

## 2013-06-10 DIAGNOSIS — N949 Unspecified condition associated with female genital organs and menstrual cycle: Secondary | ICD-10-CM | POA: Insufficient documentation

## 2013-06-10 DIAGNOSIS — Z87448 Personal history of other diseases of urinary system: Secondary | ICD-10-CM | POA: Insufficient documentation

## 2013-06-10 DIAGNOSIS — F17200 Nicotine dependence, unspecified, uncomplicated: Secondary | ICD-10-CM | POA: Insufficient documentation

## 2013-06-10 DIAGNOSIS — Z8742 Personal history of other diseases of the female genital tract: Secondary | ICD-10-CM | POA: Insufficient documentation

## 2013-06-10 LAB — URINALYSIS, ROUTINE W REFLEX MICROSCOPIC
Bilirubin Urine: NEGATIVE
Hgb urine dipstick: NEGATIVE
Ketones, ur: NEGATIVE mg/dL
Protein, ur: NEGATIVE mg/dL
Urobilinogen, UA: 0.2 mg/dL (ref 0.0–1.0)

## 2013-06-10 LAB — COMPREHENSIVE METABOLIC PANEL
ALT: 25 U/L (ref 0–35)
AST: 14 U/L (ref 0–37)
Albumin: 3.8 g/dL (ref 3.5–5.2)
Alkaline Phosphatase: 84 U/L (ref 39–117)
BUN: 8 mg/dL (ref 6–23)
CO2: 24 mEq/L (ref 19–32)
Calcium: 9.5 mg/dL (ref 8.4–10.5)
Chloride: 106 mEq/L (ref 96–112)
GFR calc non Af Amer: >90 mL/min (ref >90)
Glucose, Bld: 89 mg/dL (ref 70–99)
Sodium: 139 mEq/L (ref 135–145)
Total Bilirubin: 0.4 mg/dL (ref 0.3–1.2)
Total Protein: 7.3 g/dL (ref 6.0–8.3)

## 2013-06-10 LAB — OCCULT BLOOD, POC DEVICE: Fecal Occult Bld: NEGATIVE

## 2013-06-10 LAB — CBC WITH DIFFERENTIAL/PLATELET
Basophils Absolute: 0 10*3/uL (ref 0.0–0.1)
Eosinophils Relative: 2 % (ref 0–5)
HCT: 43.4 % (ref 36.0–46.0)
Lymphocytes Relative: 26 % (ref 12–46)
Lymphs Abs: 2.6 10*3/uL (ref 0.7–4.0)
Monocytes Absolute: 0.5 10*3/uL (ref 0.1–1.0)
Monocytes Relative: 5 % (ref 3–12)
Neutro Abs: 6.9 10*3/uL (ref 1.7–7.7)
Platelets: 260 10*3/uL (ref 150–400)
RBC: 4.6 MIL/uL (ref 3.87–5.11)
RDW: 12.2 % (ref 11.5–15.5)
WBC: 10.3 10*3/uL (ref 4.0–10.5)

## 2013-06-10 LAB — URINE MICROSCOPIC-ADD ON

## 2013-06-10 LAB — WET PREP, GENITAL

## 2013-06-10 MED ORDER — HYDROMORPHONE HCL PF 1 MG/ML IJ SOLN
1.0000 mg | Freq: Once | INTRAMUSCULAR | Status: AC
  Administered 2013-06-10: 1 mg via INTRAVENOUS
  Filled 2013-06-10: qty 1

## 2013-06-10 MED ORDER — ONDANSETRON HCL 4 MG/2ML IJ SOLN
4.0000 mg | Freq: Once | INTRAMUSCULAR | Status: AC
  Administered 2013-06-10: 4 mg via INTRAVENOUS
  Filled 2013-06-10: qty 2

## 2013-06-10 MED ORDER — HYDROCODONE-ACETAMINOPHEN 5-325 MG PO TABS: 1.0000 | ORAL_TABLET | ORAL | Status: DC | PRN

## 2013-06-10 NOTE — ED Notes (Signed)
Patient with no complaints at this time. Respirations even and unlabored. Skin warm/dry. Discharge instructions reviewed with patient at this time. Patient given opportunity to voice concerns/ask questions. IV removed per policy and band-aid applied to site. Patient discharged at this time and left Emergency Department with steady gait.  

## 2013-06-10 NOTE — ED Notes (Signed)
Pt c/o r lower back pain radiating around to rlq since Wednesday.  Reports saw blood in stool today.  Also c/o n/v.  Was seen here THursday and was told to come back for reevaluation if sees blood in stool.

## 2013-06-10 NOTE — ED Notes (Signed)
Patient states she has been having pain in R lumbar region radiating to R pelvis since Wednesday.  Noticed bright red blood in stool this morning, both on toilet paper and in toilet.  States she has to strain to have BM and defecation is painful.  States stools have been darker than normal and loose.

## 2013-06-10 NOTE — ED Notes (Signed)
Pelvic set up, RN starting IV.

## 2013-06-10 NOTE — ED Provider Notes (Signed)
Medical screening examination/treatment/procedure(s) were performed by non-physician practitioner and as supervising physician I was immediately available for consultation/collaboration. Devoria Albe, MD, Armando Gang   Ward Givens, MD 06/10/13 [phone]

## 2013-06-10 NOTE — ED Provider Notes (Signed)
CSN: [id number]     Arrival date & time 06/10/13  1207 History   First MD Initiated Contact with Patient 06/10/13 1222     Chief Complaint  Patient presents with  . Abdominal Pain  . GI Bleeding   (Consider location/radiation/quality/duration/timing/severity/associated sxs/prior Treatment) Patient is a 27 y.o. female presenting with abdominal pain. The history is provided by the patient.  Abdominal Pain Pain location:  RLQ Pain quality: sharp   Pain radiates to:  R flank Pain severity:  Severe Onset quality:  Gradual Duration:  4 days Timing:  Constant Progression:  Worsening Chronicity:  New Relieved by:  Nothing Worsened by:  Nothing tried Ineffective treatments:  Acetaminophen, NSAIDs and lying down Associated symptoms: anorexia, belching, chills, diarrhea, nausea and vomiting   Associated symptoms: no chest pain, no constipation, no cough, no dysuria, no fever, no hematemesis, no hematuria, no shortness of breath, no sore throat, no vaginal bleeding and no vaginal discharge    Valerie Robinson is a 27 y.o. female who presents to the ED with pain in the right lower abdomen. She was evaluated here 2 days ago and had CT scan for possible kidney stone and was negative. She return today with increased pain.  Past Medical History  Diagnosis Date  . Ovarian cyst   . Anxiety   . Bradycardia     pt says was seen at [facility] for bradycardia but was never put on any medication for it.  . Renal disorder    Past Surgical History  Procedure Laterality Date  . Cesarean section      x2  . Cholecystectomy N/A 03/03/2013    Procedure: LAPAROSCOPIC CHOLECYSTECTOMY;  Surgeon: Dalia Heading, MD;  Location: [facility];  Service: General;  Laterality: N/A;   No family history on file. History  Substance Use Topics  . Smoking status: Current Every Day Smoker -- 0.50 packs/day for 3 years    Types: Cigarettes  . Smokeless tobacco: Not on file  . Alcohol Use: No   OB History   Grav Para Term  Preterm Abortions TAB SAB Ect Mult Living   2 2 2       2      Review of Systems  Constitutional: Positive for chills. Negative for fever.  HENT: Negative for sore throat.   Eyes: Negative for visual disturbance.  Respiratory: Negative for cough and shortness of breath.   Cardiovascular: Negative for chest pain.  Gastrointestinal: Positive for nausea, vomiting, abdominal pain, diarrhea and anorexia. Negative for constipation and hematemesis.  Genitourinary: Negative for dysuria, hematuria, vaginal bleeding and vaginal discharge.  Skin: Negative for rash.  Allergic/Immunologic: Negative for immunocompromised state.  Neurological: Positive for headaches. Negative for dizziness and syncope.  Psychiatric/Behavioral: Nervous/anxious: hx of anxiety.     Allergies  Tramadol and Naprosyn  Home Medications   Current Outpatient Rx  Name  Route  Sig  Dispense  Refill  . acetaminophen (TYLENOL) 500 MG tablet   Oral   Take 1,000 mg by mouth every 6 (six) hours as needed for pain.         . medroxyPROGESTERone (DEPO-PROVERA) 150 MG/ML injection   Intramuscular   Inject 150 mg into the muscle every 3 (three) months.          BP 101/54  Pulse 53  Temp(Src) 97.7 F (36.5 C) (Oral)  Resp 18  Ht 5\' 6"  (1.676 m)  Wt 215 lb (97.523 kg)  BMI 34.72 kg/m2  SpO2 100% Physical Exam  Nursing note  and vitals reviewed. Constitutional: She is oriented to person, place, and time. She appears well-developed and well-nourished.  HENT:  Head: Normocephalic and atraumatic.  Eyes: EOM are normal.  Neck: Neck supple.  Cardiovascular: Normal rate and regular rhythm.   Pulmonary/Chest: Effort normal and breath sounds normal.  Abdominal: Soft. Bowel sounds are normal. There is tenderness in the right lower quadrant. There is no rigidity, no rebound, no guarding and no CVA tenderness.  Genitourinary: Rectal exam shows no external hemorrhoid, no internal hemorrhoid, no fissure, no mass, no tenderness  and anal tone normal. Guaiac negative stool.  External genitalia without lesions. White discharge vaginal vault. Positive CMT, right adnexal tenderness. Unable to palpate uterus due to patient habitus.   Musculoskeletal: Normal range of motion.  Neurological: She is alert and oriented to person, place, and time. No cranial nerve deficit.  Skin: Skin is warm and dry.  Psychiatric: She has a normal mood and affect. Her behavior is normal.    ED Course  Procedures (including critical care time) Labs Review Labs Reviewed - No data to display Imaging Review Ct Abdomen Pelvis Wo Contrast  06/09/2013   CLINICAL DATA:  Right flank pain with vomiting. Negative pregnancy test.  EXAM: CT ABDOMEN AND PELVIS WITHOUT CONTRAST  TECHNIQUE: Multidetector CT imaging of the abdomen and pelvis was performed following the standard protocol without intravenous contrast.  COMPARISON:  None.  FINDINGS: No intrarenal or proximal ureteral calculi on either side. No evidence of hydronephrosis or other secondary signs of upper urinary tract obstruction. Within limits of unenhanced technique, normal appearing kidneys.  Again, within limits of unenhanced technique, remaining visualized upper abdomen unremarkable. Visualized extreme lung bases clear. Unremarkable cholecystectomy. Negative osseous structures. Moderate stool burden.  No distal ureteral calculi on either side. Appendix identified and normal.  IMPRESSION: No evidence for renal or ureteral calculi or hydronephrosis. Unremarkable noncontrast CT.   Electronically Signed   By: Davonna Belling M.D.   On: 06/09/2013 00:14     MDM  27 y.o. female with pelvic pain. Doubt appendicitis since CT done 2 days ago and appendix was normal. Doubt kidney stone as CT 2 days ago shows no stone. No concern for GI bleed as stool guiac is negative and Hgb is stable. Consider ovarian cyst, doubt torsion as there is no rebound and patient does not appear to be in severe pain.  Much improved  after Zofran and pain medication. Patient stable for discharge without any immediate complications. Will have patient return in the morning for pelvic ultrasound. She will return sooner if symptoms worsen. Discussed with patient pain management and follow up in am. Patient is in agreement of plan.    Medication List    TAKE these medications       HYDROcodone-acetaminophen 5-325 MG per tablet  Commonly known as:  NORCO/VICODIN  Take 1 tablet by mouth every 4 (four) hours as needed.      ASK your doctor about these medications       acetaminophen 500 MG tablet  Commonly known as:  TYLENOL  Take 1,000 mg by mouth every 6 (six) hours as needed for pain.     medroxyPROGESTERone 150 MG/ML injection  Commonly known as:  DEPO-PROVERA  Inject 150 mg into the muscle every 3 (three) months.           Janne Napoleon, [place] 06/10/13 [phone]

## 2013-06-11 ENCOUNTER — Ambulatory Visit (HOSPITAL_COMMUNITY)
Admit: 2013-06-11 | Discharge: 2013-06-11 | Disposition: A | Discharge status: Home / Self Care | Payer: Medicaid Other | Source: Ambulatory Visit | Attending: Emergency Medicine | Admitting: Emergency Medicine

## 2013-06-11 ENCOUNTER — Ambulatory Visit (HOSPITAL_COMMUNITY)
Admission: RE | Admit: 2013-06-11 | Discharge: 2013-06-11 | Disposition: A | Discharge status: Home / Self Care | Payer: Medicaid Other | Source: Ambulatory Visit | Attending: Nurse Practitioner | Admitting: Nurse Practitioner

## 2013-06-11 ENCOUNTER — Other Ambulatory Visit (HOSPITAL_COMMUNITY): Payer: Self-pay | Admitting: Nurse Practitioner

## 2013-06-11 DIAGNOSIS — N83209 Unspecified ovarian cyst, unspecified side: Secondary | ICD-10-CM | POA: Insufficient documentation

## 2013-06-11 DIAGNOSIS — R102 Pelvic and perineal pain: Secondary | ICD-10-CM

## 2013-06-11 DIAGNOSIS — R1031 Right lower quadrant pain: Secondary | ICD-10-CM | POA: Insufficient documentation

## 2013-06-11 LAB — URINE CULTURE: Colony Count: NO GROWTH

## 2013-06-11 NOTE — ED Provider Notes (Signed)
0945 patient returned this morning for pelvic US.  Advised patient of ultrasound results that revealed 2.1 cm simple cyst of the right ovary.  Patient agrees to f/u with her OB/GYN  Marae Cottrell L. Trisha Mangle, PA-C 06/11/13 [id number]

## 2013-06-11 NOTE — ED Provider Notes (Signed)
Medical screening examination/treatment/procedure(s) were performed by non-physician practitioner and as supervising physician I was immediately available for consultation/collaboration.  Donnetta Hutching, MD 06/11/13 [phone]

## 2013-06-12 LAB — GC/CHLAMYDIA PROBE AMP
CT Probe RNA: NEGATIVE
GC Probe RNA: NEGATIVE

## 2013-07-01 ENCOUNTER — Emergency Department (HOSPITAL_COMMUNITY)
Admission: EM | Admit: 2013-07-01 | Discharge: 2013-07-01 | Disposition: A | Discharge status: Home / Self Care | Payer: Medicaid Other | Attending: Emergency Medicine | Admitting: Emergency Medicine

## 2013-07-01 ENCOUNTER — Encounter (HOSPITAL_COMMUNITY): Payer: Self-pay | Admitting: Emergency Medicine

## 2013-07-01 DIAGNOSIS — Z87448 Personal history of other diseases of urinary system: Secondary | ICD-10-CM | POA: Insufficient documentation

## 2013-07-01 DIAGNOSIS — F17200 Nicotine dependence, unspecified, uncomplicated: Secondary | ICD-10-CM | POA: Insufficient documentation

## 2013-07-01 DIAGNOSIS — Z3202 Encounter for pregnancy test, result negative: Secondary | ICD-10-CM | POA: Insufficient documentation

## 2013-07-01 DIAGNOSIS — R1031 Right lower quadrant pain: Secondary | ICD-10-CM | POA: Insufficient documentation

## 2013-07-01 DIAGNOSIS — Z8742 Personal history of other diseases of the female genital tract: Secondary | ICD-10-CM | POA: Insufficient documentation

## 2013-07-01 DIAGNOSIS — G8929 Other chronic pain: Secondary | ICD-10-CM | POA: Insufficient documentation

## 2013-07-01 DIAGNOSIS — Z8679 Personal history of other diseases of the circulatory system: Secondary | ICD-10-CM | POA: Insufficient documentation

## 2013-07-01 DIAGNOSIS — Z8659 Personal history of other mental and behavioral disorders: Secondary | ICD-10-CM | POA: Insufficient documentation

## 2013-07-01 DIAGNOSIS — R112 Nausea with vomiting, unspecified: Secondary | ICD-10-CM | POA: Insufficient documentation

## 2013-07-01 LAB — URINALYSIS, ROUTINE W REFLEX MICROSCOPIC
Glucose, UA: NEGATIVE mg/dL
Hgb urine dipstick: NEGATIVE
Ketones, ur: NEGATIVE mg/dL
Leukocytes, UA: NEGATIVE
Nitrite: NEGATIVE
Specific Gravity, Urine: 1.02 (ref 1.005–1.030)
pH: 6 (ref 5.0–8.0)

## 2013-07-01 MED ORDER — PROMETHAZINE HCL 12.5 MG PO TABS
25.0000 mg | ORAL_TABLET | Freq: Once | ORAL | Status: AC
  Administered 2013-07-01: 25 mg via ORAL
  Filled 2013-07-01: qty 2

## 2013-07-01 NOTE — ED Provider Notes (Signed)
CSN: [id number]     Arrival date & time 07/01/13  1600 History   First MD Initiated Contact with Patient 07/01/13 1750     This chart was scribed for [name], MD by Manuela Schwartz, ED scribe. This patient was seen in room APA08/[facility]08 and the patient's care was started at 1750.  Chief Complaint  Patient presents with  . Flank Pain   Patient is a 27 y.o. female presenting with flank pain. The history is provided by the patient. No language interpreter was used.  Flank Pain This is a chronic problem. The current episode started more than 1 week ago. The problem occurs constantly. The problem has not changed since onset.Associated symptoms include abdominal pain. Pertinent negatives include no chest pain, no headaches and no shortness of breath. Nothing aggravates the symptoms. Relieved by: warm bath. She has tried nothing for the symptoms.   HPI Comments: Valerie Robinson is a 27 y.o. female who presents to the Emergency Department w/recent multiple visits for same problem complaining of waxing and waning, 10/10 right lower quadrant abdominal pain which radiates to her right flank, onset 3 weeks ago. She states multiple workups for this problem and unsure to the source of her pain. She was seen here 10/18 and 1016 (She had a negative abdominal CT on 10/16). She states associated emesis/nausea and has been taking phenergan at home for nausea w/some relief. She states some relief from abdominal pain after a hot bath, nothing makes it worse. She denies any associated dysuria, hematuria, diarrhea, fever/chills.  She denies any CP, SOB, skin rash, visual disturbances, hemophilia, pitting edema, cough/cold, sore throat and no rhinorrhea.  She is on depo provera.  Her PCP is Dr. Shea Evans at [place].  Past Medical History  Diagnosis Date  . Ovarian cyst   . Anxiety   . Bradycardia     pt says was seen at [facility] for bradycardia but was never put on any medication for it.  . Renal disorder     Past Surgical History  Procedure Laterality Date  . Cesarean section      x2  . Cholecystectomy N/A 03/03/2013    Procedure: LAPAROSCOPIC CHOLECYSTECTOMY;  Surgeon: Dalia Heading, MD;  Location: [facility];  Service: General;  Laterality: N/A;   No family history on file. History  Substance Use Topics  . Smoking status: Current Every Day Smoker -- 0.50 packs/day for 3 years    Types: Cigarettes  . Smokeless tobacco: Not on file  . Alcohol Use: No   OB History   Grav Para Term Preterm Abortions TAB SAB Ect Mult Living   2 2 2       2      Review of Systems  Constitutional: Negative for fever and chills.  HENT: Negative for rhinorrhea and sore throat.   Eyes: Negative for visual disturbance.  Respiratory: Negative for cough and shortness of breath.   Cardiovascular: Negative for chest pain.  Gastrointestinal: Positive for nausea and abdominal pain. Negative for vomiting and diarrhea.  Genitourinary: Positive for flank pain. Negative for dysuria and hematuria.  Musculoskeletal: Negative for joint swelling.  Skin: Negative for rash.  Neurological: Negative for syncope, weakness and headaches.  All other systems reviewed and are negative.   A complete 10 system review of systems was obtained and all systems are negative except as noted in the HPI and PMH.   Allergies  Tramadol and Naprosyn  Home Medications   Current Outpatient Rx  Name  Route  Sig  Dispense  Refill  . acetaminophen (TYLENOL) 500 MG tablet   Oral   Take 1,000 mg by mouth every 6 (six) hours as needed for pain.         . medroxyPROGESTERone (DEPO-PROVERA) 150 MG/ML injection   Intramuscular   Inject 150 mg into the muscle every 3 (three) months.          Triage Vitals: BP 109/60  Pulse 86  Temp(Src) 97.9 F (36.6 C) (Oral)  Resp 16  Ht 5\' 6"  (1.676 m)  Wt 215 lb (97.523 kg)  BMI 34.72 kg/m2  SpO2 100% Physical Exam  Nursing note and vitals reviewed. Constitutional: She is oriented to  person, place, and time. She appears well-developed and well-nourished. No distress.  HENT:  Head: Normocephalic and atraumatic.  Mouth/Throat: Oropharynx is clear and moist.  Eyes: Conjunctivae and EOM are normal. Right eye exhibits no discharge. Left eye exhibits no discharge.  Neck: Neck supple. No tracheal deviation present.  Cardiovascular: Normal rate, regular rhythm and normal heart sounds.   No murmur heard. Pulmonary/Chest: Effort normal and breath sounds normal. No respiratory distress. She has no wheezes. She has no rales.  Abdominal: Soft. Bowel sounds are normal. She exhibits no distension. There is tenderness (Tenderness to palpation RLQ). There is no guarding.  Musculoskeletal: Normal range of motion. She exhibits no edema and no tenderness.  Neurological: She is alert and oriented to person, place, and time. No cranial nerve deficit.  Skin: Skin is warm and dry.  Psychiatric: She has a normal mood and affect. Her behavior is normal.    ED Course  Procedures (including critical care time) DIAGNOSTIC STUDIES: Oxygen Saturation is 100% on room air, normal by my interpretation.    COORDINATION OF CARE: At 650 PM Discussed treatment plan with patient which includes phenergan, UA. Patient agrees.   Labs Review Labs Reviewed  URINALYSIS, ROUTINE W REFLEX MICROSCOPIC - Abnormal; Notable for the following:    APPearance HAZY (*)    All other components within normal limits  POCT PREGNANCY, URINE   Results for orders placed during the hospital encounter of 07/01/13  URINALYSIS, ROUTINE W REFLEX MICROSCOPIC      Result Value Range   Color, Urine YELLOW  YELLOW   APPearance HAZY (*) CLEAR   Specific Gravity, Urine 1.020  1.005 - 1.030   pH 6.0  5.0 - 8.0   Glucose, UA NEGATIVE  NEGATIVE mg/dL   Hgb urine dipstick NEGATIVE  NEGATIVE   Bilirubin Urine NEGATIVE  NEGATIVE   Ketones, ur NEGATIVE  NEGATIVE mg/dL   Protein, ur NEGATIVE  NEGATIVE mg/dL   Urobilinogen, UA 0.2   0.0 - 1.0 mg/dL   Nitrite NEGATIVE  NEGATIVE   Leukocytes, UA NEGATIVE  NEGATIVE  POCT PREGNANCY, URINE      Result Value Range   Preg Test, Ur NEGATIVE  NEGATIVE    Imaging Review No results found.  EKG Interpretation   None       MDM   1. Chronic abdominal pain    Patient now with a several visits for the same complaint. This is her fourth or fifth visit for this since October. Patient was seen October 16 with a negative CT of the abdomen and pelvis labs were negative. Patient was seen October 18 with a pelvic exam cultures were negative urinalysis was negative ultrasound of pelvis was negative other than for ovarian cyst. Patient's today vital signs are negative. Urinalysis is negative pregnancy test is negative. Patient  having persistent right lower corner and right flank pain now for 3 weeks. This is most likely chronic in nature could be related to chronic pelvic problems. Follow up with OB/GYN would be pertinent.    I personally performed the services described in this documentation, which was scribed in my presence. The recorded information has been reviewed and is accurate.       [name], MD 07/01/13 [phone]

## 2013-07-01 NOTE — ED Notes (Signed)
Right flank pain x 3 weeks. Seen here 2 weeks ago for RLQ pain but now pain is wrapping around the side. Pt also states vomiting. Last vomited just PTA.

## 2013-07-01 NOTE — ED Notes (Signed)
Patient seen walking out of emergency department prior to receiving nursing discharge instructions.

## 2013-07-10 ENCOUNTER — Encounter (HOSPITAL_COMMUNITY): Payer: Self-pay | Admitting: Emergency Medicine

## 2013-07-10 ENCOUNTER — Emergency Department (HOSPITAL_COMMUNITY): Payer: Medicaid Other

## 2013-07-10 ENCOUNTER — Emergency Department (HOSPITAL_COMMUNITY)
Admission: EM | Admit: 2013-07-10 | Discharge: 2013-07-10 | Disposition: A | Discharge status: Home / Self Care | Payer: Medicaid Other | Attending: Emergency Medicine | Admitting: Emergency Medicine

## 2013-07-10 DIAGNOSIS — F17200 Nicotine dependence, unspecified, uncomplicated: Secondary | ICD-10-CM | POA: Insufficient documentation

## 2013-07-10 DIAGNOSIS — W172XXA Fall into hole, initial encounter: Secondary | ICD-10-CM | POA: Insufficient documentation

## 2013-07-10 DIAGNOSIS — IMO0002 Reserved for concepts with insufficient information to code with codable children: Secondary | ICD-10-CM | POA: Insufficient documentation

## 2013-07-10 DIAGNOSIS — X500XXA Overexertion from strenuous movement or load, initial encounter: Secondary | ICD-10-CM | POA: Insufficient documentation

## 2013-07-10 DIAGNOSIS — Y9389 Activity, other specified: Secondary | ICD-10-CM | POA: Insufficient documentation

## 2013-07-10 DIAGNOSIS — Z8742 Personal history of other diseases of the female genital tract: Secondary | ICD-10-CM | POA: Insufficient documentation

## 2013-07-10 DIAGNOSIS — Z8679 Personal history of other diseases of the circulatory system: Secondary | ICD-10-CM | POA: Insufficient documentation

## 2013-07-10 DIAGNOSIS — Z8659 Personal history of other mental and behavioral disorders: Secondary | ICD-10-CM | POA: Insufficient documentation

## 2013-07-10 DIAGNOSIS — S8391XA Sprain of unspecified site of right knee, initial encounter: Secondary | ICD-10-CM

## 2013-07-10 DIAGNOSIS — S93409A Sprain of unspecified ligament of unspecified ankle, initial encounter: Secondary | ICD-10-CM | POA: Insufficient documentation

## 2013-07-10 DIAGNOSIS — Z79899 Other long term (current) drug therapy: Secondary | ICD-10-CM | POA: Insufficient documentation

## 2013-07-10 DIAGNOSIS — S93401A Sprain of unspecified ligament of right ankle, initial encounter: Secondary | ICD-10-CM

## 2013-07-10 DIAGNOSIS — Z87448 Personal history of other diseases of urinary system: Secondary | ICD-10-CM | POA: Insufficient documentation

## 2013-07-10 DIAGNOSIS — Y9289 Other specified places as the place of occurrence of the external cause: Secondary | ICD-10-CM | POA: Insufficient documentation

## 2013-07-10 MED ORDER — ACETAMINOPHEN 500 MG PO TABS
1000.0000 mg | ORAL_TABLET | Freq: Once | ORAL | Status: AC
  Administered 2013-07-10: 1000 mg via ORAL
  Filled 2013-07-10: qty 2

## 2013-07-10 NOTE — ED Notes (Signed)
Pt states she stepped into a hole today, twisting her right ankle and knee. Small bruise to right knee.

## 2013-07-10 NOTE — ED Provider Notes (Signed)
Medical screening examination/treatment/procedure(s) were performed by non-physician practitioner and as supervising physician I was immediately available for consultation/collaboration.  EKG Interpretation   None         Lyanne Co, MD 07/10/13 2052

## 2013-07-10 NOTE — ED Notes (Signed)
Alert, NAD, Pain rt knee and ankle, already seen by Loney Laurence.PA.

## 2013-07-10 NOTE — ED Provider Notes (Signed)
CSN: [id number]     Arrival date & time 07/10/13  1743 History   First MD Initiated Contact with Patient 07/10/13 1756     Chief Complaint  Patient presents with  . Knee Pain  . Ankle Pain   (Consider location/radiation/quality/duration/timing/severity/associated sxs/prior Treatment) Patient is a 27 y.o. female presenting with knee pain and ankle pain. The history is provided by the patient.  Knee Pain Location:  Knee Time since incident:  3 hours Injury: yes   Mechanism of injury: fall   Mechanism of injury comment:  Fall with twisting injury. Fall:    Fall occurred:  Tripped (Pt stepped into a hole and injured the right knee and ankle.)   Entrapped after fall: no   Knee location:  R knee Pain details:    Quality:  Aching and shooting   Radiates to: mid anterior calf.   Severity:  Moderate   Onset quality:  Sudden   Duration:  3 hours   Timing:  Constant   Progression:  Worsening Chronicity:  New Dislocation: no   Foreign body present:  No foreign bodies Prior injury to area:  No Relieved by:  Nothing Worsened by:  Bearing weight Ineffective treatments:  None tried Associated symptoms: stiffness   Associated symptoms: no back pain, no neck pain and no numbness   Risk factors: no known bone disorder and no recent illness   Ankle Pain Associated symptoms: stiffness   Associated symptoms: no back pain, no neck pain and no numbness     Past Medical History  Diagnosis Date  . Ovarian cyst   . Anxiety   . Bradycardia     pt says was seen at [facility] for bradycardia but was never put on any medication for it.  . Renal disorder    Past Surgical History  Procedure Laterality Date  . Cesarean section      x2  . Cholecystectomy N/A 03/03/2013    Procedure: LAPAROSCOPIC CHOLECYSTECTOMY;  Surgeon: Dalia Heading, MD;  Location: [facility];  Service: General;  Laterality: N/A;   No family history on file. History  Substance Use Topics  . Smoking status: Current Every Day  Smoker -- 0.50 packs/day for 3 years    Types: Cigarettes  . Smokeless tobacco: Not on file  . Alcohol Use: No   OB History   Grav Para Term Preterm Abortions TAB SAB Ect Mult Living   2 2 2       2      Review of Systems  Constitutional: Negative for activity change.       All ROS Neg except as noted in HPI  HENT: Negative for nosebleeds.   Eyes: Negative for photophobia and discharge.  Respiratory: Negative for cough, shortness of breath and wheezing.   Cardiovascular: Negative for chest pain and palpitations.  Gastrointestinal: Negative for abdominal pain and blood in stool.  Genitourinary: Negative for dysuria, frequency and hematuria.  Musculoskeletal: Positive for stiffness. Negative for arthralgias, back pain and neck pain.  Skin: Negative.   Neurological: Negative for dizziness, seizures and speech difficulty.  Psychiatric/Behavioral: Negative for hallucinations and confusion.    Allergies  Tramadol and Naprosyn  Home Medications   Current Outpatient Rx  Name  Route  Sig  Dispense  Refill  . acetaminophen (TYLENOL) 500 MG tablet   Oral   Take 1,000 mg by mouth every 6 (six) hours as needed for pain.         . medroxyPROGESTERone (DEPO-PROVERA) 150 MG/ML injection  Intramuscular   Inject 150 mg into the muscle every 3 (three) months.          BP 119/88  Pulse 88  Temp(Src) 98 F (36.7 C) (Oral)  Resp 18  Ht 5\' 6"  (1.676 m)  Wt 220 lb (99.791 kg)  BMI 35.53 kg/m2  SpO2 99% Physical Exam  Nursing note and vitals reviewed. Constitutional: She is oriented to person, place, and time. She appears well-developed and well-nourished.  Non-toxic appearance.  HENT:  Head: Normocephalic.  Right Ear: Tympanic membrane and external ear normal.  Left Ear: Tympanic membrane and external ear normal.  Eyes: EOM and lids are normal. Pupils are equal, round, and reactive to light.  Neck: Normal range of motion. Neck supple. Carotid bruit is not present.   Cardiovascular: Normal rate, regular rhythm, normal heart sounds, intact distal pulses and normal pulses.   Pulmonary/Chest: Breath sounds normal. No respiratory distress.  Abdominal: Soft. Bowel sounds are normal. There is no tenderness. There is no guarding.  Musculoskeletal: Normal range of motion.  There is good range of motion of the right hip. There is pain with attempted range of motion of the right knee. No effusion appreciated. No deformity noted. Patella is in the midline.  There is a bruise to the anterior tibial area. No deformity of the tibia.  There is pain with attempted range of motion of the right ankle. No effusion noted. No deformity appreciated. The dorsalis pedis pulses 2+ bilaterally.  Lymphadenopathy:       Head (right side): No submandibular adenopathy present.       Head (left side): No submandibular adenopathy present.    She has no cervical adenopathy.  Neurological: She is alert and oriented to person, place, and time. She has normal strength. No cranial nerve deficit or sensory deficit.  Skin: Skin is warm and dry.  Psychiatric: She has a normal mood and affect. Her speech is normal.    ED Course  Procedures (including critical care time) Labs Review Labs Reviewed - No data to display Imaging Review No results found.  EKG Interpretation   None       MDM  No diagnosis found. *I have reviewed nursing notes, vital signs, and all appropriate lab and imaging results for this patient.**  X-ray of the right ankle is negative for fracture or dislocation. X-ray of the right knee is negative for fracture, dislocation, or effusion.  Patient fitted with a knee immobilizer. Patient states that she has crutches at home. Patient advised to apply ice and keep the area elevated. Patient advised to use Tylenol extra strength every 4 hours as needed for soreness. Patient to followup with orthopedics if not improving.  Kathie Dike, PA-C 07/10/13 1943

## 2013-07-12 ENCOUNTER — Encounter (HOSPITAL_COMMUNITY): Payer: Self-pay | Admitting: Emergency Medicine

## 2013-07-12 ENCOUNTER — Emergency Department (HOSPITAL_COMMUNITY)
Admission: EM | Admit: 2013-07-12 | Discharge: 2013-07-12 | Disposition: A | Discharge status: Home / Self Care | Payer: Medicaid Other | Attending: Emergency Medicine | Admitting: Emergency Medicine

## 2013-07-12 DIAGNOSIS — S39012A Strain of muscle, fascia and tendon of lower back, initial encounter: Secondary | ICD-10-CM

## 2013-07-12 DIAGNOSIS — Z79899 Other long term (current) drug therapy: Secondary | ICD-10-CM | POA: Insufficient documentation

## 2013-07-12 DIAGNOSIS — S335XXA Sprain of ligaments of lumbar spine, initial encounter: Secondary | ICD-10-CM | POA: Insufficient documentation

## 2013-07-12 DIAGNOSIS — X500XXA Overexertion from strenuous movement or load, initial encounter: Secondary | ICD-10-CM | POA: Insufficient documentation

## 2013-07-12 DIAGNOSIS — G8929 Other chronic pain: Secondary | ICD-10-CM | POA: Insufficient documentation

## 2013-07-12 DIAGNOSIS — F17200 Nicotine dependence, unspecified, uncomplicated: Secondary | ICD-10-CM | POA: Insufficient documentation

## 2013-07-12 DIAGNOSIS — Z8679 Personal history of other diseases of the circulatory system: Secondary | ICD-10-CM | POA: Insufficient documentation

## 2013-07-12 DIAGNOSIS — Z8742 Personal history of other diseases of the female genital tract: Secondary | ICD-10-CM | POA: Insufficient documentation

## 2013-07-12 DIAGNOSIS — F411 Generalized anxiety disorder: Secondary | ICD-10-CM | POA: Insufficient documentation

## 2013-07-12 DIAGNOSIS — Y929 Unspecified place or not applicable: Secondary | ICD-10-CM | POA: Insufficient documentation

## 2013-07-12 DIAGNOSIS — Z87448 Personal history of other diseases of urinary system: Secondary | ICD-10-CM | POA: Insufficient documentation

## 2013-07-12 DIAGNOSIS — Y9389 Activity, other specified: Secondary | ICD-10-CM | POA: Insufficient documentation

## 2013-07-12 MED ORDER — CYCLOBENZAPRINE HCL 5 MG PO TABS: 5.0000 mg | ORAL_TABLET | Freq: Three times a day (TID) | ORAL | Status: DC | PRN

## 2013-07-12 MED ORDER — KETOROLAC TROMETHAMINE 60 MG/2ML IM SOLN
60.0000 mg | Freq: Once | INTRAMUSCULAR | Status: AC
  Administered 2013-07-12: 60 mg via INTRAMUSCULAR

## 2013-07-12 MED ORDER — KETOROLAC TROMETHAMINE 60 MG/2ML IM SOLN
INTRAMUSCULAR | Status: AC
  Administered 2013-07-12: 60 mg via INTRAMUSCULAR
  Filled 2013-07-12: qty 2

## 2013-07-12 MED ORDER — IBUPROFEN 600 MG PO TABS: 600.0000 mg | ORAL_TABLET | Freq: Three times a day (TID) | ORAL | Status: DC | PRN

## 2013-07-12 NOTE — ED Notes (Signed)
Patient reports picked up tv today. Complaining of back pain to lower back.

## 2013-07-14 NOTE — ED Provider Notes (Signed)
CSN: [id number]     Arrival date & time 07/12/13  2011 History   First MD Initiated Contact with Patient 07/12/13 2021     Chief Complaint  Patient presents with  . Back Pain   (Consider location/radiation/quality/duration/timing/severity/associated sxs/prior Treatment) HPI Comments: Valerie Robinson is a 27 y.o. Female well known to this emergency department with acute on intermittently chronic low back pain which started today after picking up a tv.    She denies radiation into her lower extremities nor has there been weakness or numbness, urinary or bowel retention or incontinence.  Patient does not have a history of cancer or IVDU.  She has taken tylenol without relief of pain.      The history is provided by the patient.    Past Medical History  Diagnosis Date  . Ovarian cyst   . Anxiety   . Bradycardia     pt says was seen at [facility] for bradycardia but was never put on any medication for it.  . Renal disorder    Past Surgical History  Procedure Laterality Date  . Cesarean section      x2  . Cholecystectomy N/A 03/03/2013    Procedure: LAPAROSCOPIC CHOLECYSTECTOMY;  Surgeon: Dalia Heading, MD;  Location: [facility];  Service: General;  Laterality: N/A;   History reviewed. No pertinent family history. History  Substance Use Topics  . Smoking status: Current Every Day Smoker -- 0.50 packs/day for 3 years    Types: Cigarettes  . Smokeless tobacco: Not on file  . Alcohol Use: No   OB History   Grav Para Term Preterm Abortions TAB SAB Ect Mult Living   2 2 2       2      Review of Systems  Constitutional: Negative for fever.  Respiratory: Negative for shortness of breath.   Cardiovascular: Negative for chest pain and leg swelling.  Gastrointestinal: Negative for abdominal pain, constipation and abdominal distention.  Genitourinary: Negative for dysuria, urgency, frequency, flank pain and difficulty urinating.  Musculoskeletal: Positive for back pain. Negative for gait  problem and joint swelling.  Skin: Negative for rash.  Neurological: Negative for weakness and numbness.    Allergies  Tramadol and Naprosyn  Home Medications   Current Outpatient Rx  Name  Route  Sig  Dispense  Refill  . acetaminophen (TYLENOL) 500 MG tablet   Oral   Take 1,000 mg by mouth every 6 (six) hours as needed for pain.         . cyclobenzaprine (FLEXERIL) 5 MG tablet   Oral   Take 1 tablet (5 mg total) by mouth 3 (three) times daily as needed for muscle spasms.   15 tablet   0   . ibuprofen (ADVIL,MOTRIN) 600 MG tablet   Oral   Take 1 tablet (600 mg total) by mouth every 8 (eight) hours as needed.   15 tablet   0   . medroxyPROGESTERone (DEPO-PROVERA) 150 MG/ML injection   Intramuscular   Inject 150 mg into the muscle every 3 (three) months.          BP 119/64  Pulse 80  Temp(Src) 97.6 F (36.4 C) (Oral)  Resp 24  Ht 5\' 6"  (1.676 m)  Wt 220 lb (99.791 kg)  BMI 35.53 kg/m2  SpO2 100% Physical Exam  Nursing note and vitals reviewed. Constitutional: She appears well-developed and well-nourished.  HENT:  Head: Normocephalic.  Eyes: Conjunctivae are normal.  Neck: Normal range of motion. Neck supple.  Cardiovascular: Normal rate and intact distal pulses.   Pedal pulses normal.  Pulmonary/Chest: Effort normal.  Abdominal: Soft. Bowel sounds are normal. She exhibits no distension and no mass.  Musculoskeletal: Normal range of motion. She exhibits no edema.       Lumbar back: She exhibits tenderness. She exhibits no bony tenderness, no swelling, no edema and no spasm.  Bilateral paralumbar ttp, no midline pain.  Neurological: She is alert. She has normal strength. She displays no atrophy and no tremor. No sensory deficit. Gait normal.  Reflex Scores:      Patellar reflexes are 2+ on the right side and 2+ on the left side.      Achilles reflexes are 2+ on the right side and 2+ on the left side. No strength deficit noted in hip and knee flexor and  extensor muscle groups.  Ankle flexion and extension intact.  Skin: Skin is warm and dry.  Psychiatric: She has a normal mood and affect.    ED Course  Procedures (including critical care time) Labs Review Labs Reviewed - No data to display Imaging Review No results found.  EKG Interpretation   None       MDM   1. Lumbar strain, initial encounter    Pt advised avoiding activity that worsens pain, ice tx,  Ibuprofen, flexeril prescribed.  Prn f/u with pcp if not improved over the next week.  No neuro deficit on exam or by history to suggest emergent or surgical presentation.  Also discussed worsened sx that should prompt immediate re-evaluation including distal weakness, bowel/bladder retention/incontinence.          Burgess Amor, PA-C 07/14/13 1348

## 2013-07-14 NOTE — ED Provider Notes (Signed)
Medical screening examination/treatment/procedure(s) were performed by non-physician practitioner and as supervising physician I was immediately available for consultation/collaboration.  Aleta Manternach L Lynsie Mcwatters, MD 07/14/13 1519 

## 2013-07-15 ENCOUNTER — Emergency Department (HOSPITAL_COMMUNITY)
Admission: EM | Admit: 2013-07-15 | Discharge: 2013-07-15 | Disposition: A | Discharge status: Home / Self Care | Payer: Medicaid Other | Attending: Emergency Medicine | Admitting: Emergency Medicine

## 2013-07-15 ENCOUNTER — Encounter (HOSPITAL_COMMUNITY): Payer: Self-pay | Admitting: Emergency Medicine

## 2013-07-15 DIAGNOSIS — Z8742 Personal history of other diseases of the female genital tract: Secondary | ICD-10-CM | POA: Insufficient documentation

## 2013-07-15 DIAGNOSIS — Y929 Unspecified place or not applicable: Secondary | ICD-10-CM | POA: Insufficient documentation

## 2013-07-15 DIAGNOSIS — M546 Pain in thoracic spine: Secondary | ICD-10-CM

## 2013-07-15 DIAGNOSIS — Z23 Encounter for immunization: Secondary | ICD-10-CM | POA: Insufficient documentation

## 2013-07-15 DIAGNOSIS — F17200 Nicotine dependence, unspecified, uncomplicated: Secondary | ICD-10-CM | POA: Insufficient documentation

## 2013-07-15 DIAGNOSIS — Y9389 Activity, other specified: Secondary | ICD-10-CM | POA: Insufficient documentation

## 2013-07-15 DIAGNOSIS — S91332A Puncture wound without foreign body, left foot, initial encounter: Secondary | ICD-10-CM

## 2013-07-15 DIAGNOSIS — Z8659 Personal history of other mental and behavioral disorders: Secondary | ICD-10-CM | POA: Insufficient documentation

## 2013-07-15 DIAGNOSIS — X503XXA Overexertion from repetitive movements, initial encounter: Secondary | ICD-10-CM | POA: Insufficient documentation

## 2013-07-15 DIAGNOSIS — S91309A Unspecified open wound, unspecified foot, initial encounter: Secondary | ICD-10-CM | POA: Insufficient documentation

## 2013-07-15 DIAGNOSIS — Z8679 Personal history of other diseases of the circulatory system: Secondary | ICD-10-CM | POA: Insufficient documentation

## 2013-07-15 DIAGNOSIS — S239XXA Sprain of unspecified parts of thorax, initial encounter: Secondary | ICD-10-CM | POA: Insufficient documentation

## 2013-07-15 DIAGNOSIS — Z87448 Personal history of other diseases of urinary system: Secondary | ICD-10-CM | POA: Insufficient documentation

## 2013-07-15 DIAGNOSIS — W268XXA Contact with other sharp object(s), not elsewhere classified, initial encounter: Secondary | ICD-10-CM | POA: Insufficient documentation

## 2013-07-15 MED ORDER — HYDROCODONE-ACETAMINOPHEN 5-325 MG PO TABS: 1.0000 | ORAL_TABLET | ORAL | Status: DC | PRN

## 2013-07-15 MED ORDER — CIPROFLOXACIN HCL 500 MG PO TABS: 500.0000 mg | ORAL_TABLET | Freq: Two times a day (BID) | ORAL | Status: DC

## 2013-07-15 MED ORDER — TETANUS-DIPHTH-ACELL PERTUSSIS 5-2.5-18.5 LF-MCG/0.5 IM SUSP
0.5000 mL | Freq: Once | INTRAMUSCULAR | Status: AC
  Administered 2013-07-15: 0.5 mL via INTRAMUSCULAR
  Filled 2013-07-15: qty 0.5

## 2013-07-15 MED ORDER — PREDNISONE (PAK) 10 MG PO TABS: ORAL_TABLET | Freq: Every day | ORAL | Status: DC

## 2013-07-15 NOTE — ED Provider Notes (Signed)
Medical screening examination/treatment/procedure(s) were performed by non-physician practitioner and as supervising physician I was immediately available for consultation/collaboration.  EKG Interpretation   None         Kayzen Kendzierski L Ladd Cen, MD 07/15/13 1100 

## 2013-07-15 NOTE — ED Provider Notes (Signed)
CSN: [id number]     Arrival date & time 07/15/13  0802 History   First MD Initiated Contact with Patient 07/15/13 [phone]     Chief Complaint  Patient presents with  . Back Pain   (Consider location/radiation/quality/duration/timing/severity/associated sxs/prior Treatment) Patient is a 27 y.o. female presenting with back pain.  Back Pain Location:  Thoracic spine Quality:  Aching Radiates to:  L shoulder and R shoulder Pain severity:  Moderate Pain is:  Same all the time Onset quality:  Sudden Duration:  3 days Timing:  Constant Progression:  Unchanged Chronicity:  New Context: lifting heavy objects and physical stress   Relieved by:  Nothing Worsened by:  Movement, standing, bending and twisting Ineffective treatments:  None tried Associated symptoms: no abdominal pain, no chest pain, no dysuria, no fever and no headaches     Valerie Robinson is a 27 y.o. female who presents to the ED with thoracic back pain and puncture laceration to the left heel where she stepped on a rusty nail last night. The nail went through the rubber bottom of her flip flops.  Past Medical History  Diagnosis Date  . Ovarian cyst   . Anxiety   . Bradycardia     pt says was seen at [facility] for bradycardia but was never put on any medication for it.  . Renal disorder    Past Surgical History  Procedure Laterality Date  . Cesarean section      x2  . Cholecystectomy N/A 03/03/2013    Procedure: LAPAROSCOPIC CHOLECYSTECTOMY;  Surgeon: Dalia Heading, MD;  Location: [facility];  Service: General;  Laterality: N/A;   No family history on file. History  Substance Use Topics  . Smoking status: Current Every Day Smoker -- 0.50 packs/day for 3 years    Types: Cigarettes  . Smokeless tobacco: Not on file  . Alcohol Use: No   OB History   Grav Para Term Preterm Abortions TAB SAB Ect Mult Living   2 2 2       2      Review of Systems  Constitutional: Negative for fever and chills.  HENT: Negative for  congestion, ear pain, sneezing and sore throat.   Eyes: Negative for visual disturbance.  Respiratory: Negative for shortness of breath and wheezing.   Cardiovascular: Negative for chest pain.  Gastrointestinal: Negative for nausea, vomiting and abdominal pain.  Genitourinary: Negative for dysuria and frequency.  Musculoskeletal: Positive for back pain.       Puncture laceration left heel  Skin: Positive for wound.  Neurological: Negative for light-headedness and headaches.  Psychiatric/Behavioral: The patient is not nervous/anxious (hx of anxiety).     Allergies  Tramadol; Motrin; and Naprosyn  Home Medications   Current Outpatient Rx  Name  Route  Sig  Dispense  Refill  . acetaminophen (TYLENOL) 500 MG tablet   Oral   Take 1,000 mg by mouth every 6 (six) hours as needed for pain.         . cyclobenzaprine (FLEXERIL) 5 MG tablet   Oral   Take 1 tablet (5 mg total) by mouth 3 (three) times daily as needed for muscle spasms.   15 tablet   0   . ibuprofen (ADVIL,MOTRIN) 600 MG tablet   Oral   Take 1 tablet (600 mg total) by mouth every 8 (eight) hours as needed.   15 tablet   0   . medroxyPROGESTERone (DEPO-PROVERA) 150 MG/ML injection   Intramuscular   Inject 150 mg  into the muscle every 3 (three) months.          BP 105/66  Pulse 66  Temp(Src) 97.4 F (36.3 C) (Oral)  Resp 18  Ht 5\' 6"  (1.676 m)  Wt 220 lb (99.791 kg)  BMI 35.53 kg/m2  SpO2 99% Physical Exam  Nursing note and vitals reviewed. Constitutional: She is oriented to person, place, and time. She appears well-developed and well-nourished. No distress.  HENT:  Head: Normocephalic and atraumatic.  Eyes: EOM are normal.  Neck: Normal range of motion. Neck supple.  Cardiovascular: Normal rate and regular rhythm.   Pulmonary/Chest: Effort normal and breath sounds normal.  Abdominal: Soft. There is no tenderness.  Musculoskeletal: Normal range of motion.       Thoracic back: She exhibits tenderness.  She exhibits normal range of motion, no swelling, no deformity, no spasm and normal pulse.       Back:       Left foot: She exhibits tenderness and laceration. She exhibits normal range of motion.       Feet:  There is a puncture laceration noted to the left heel where the patient stepped on a nail last night. The area is tender on exam. There is no erythema noted at this time. The laceration approximately 1.5 cm.  Neurological: She is alert and oriented to person, place, and time. No cranial nerve deficit.  Skin: Skin is warm and dry.  Psychiatric: She has a normal mood and affect. Her behavior is normal.    ED Course  Procedures  Wound cleaned with betadine scrub brush. Dressing applied. EKG Interpretation   None       MDM  27 y.o. female with puncture laceration to the left heel where she stepped on a nail last night. Tetanus booster given here in ED and patient started on antibiotics. She also has thoracic strain due to lifting a heavy TV 3 days ago. Hydrocodone 5mg   #6 tablets given and prednisone. Patient states she had allergic reaction to ibuprofen with rash and itching. She is stable for discharge without any immediate complications. She will follow up with her PCP. Discussed with the patient and all questioned fully answered.    Medication List    TAKE these medications       ciprofloxacin 500 MG tablet  Commonly known as:  CIPRO  Take 1 tablet (500 mg total) by mouth every 12 (twelve) hours.     HYDROcodone-acetaminophen 5-325 MG per tablet  Commonly known as:  NORCO/VICODIN  Take 1 tablet by mouth every 4 (four) hours as needed.     predniSONE 10 MG tablet  Commonly known as:  STERAPRED UNI-PAK  Take by mouth daily. Take 6 tablets today then 5, 4, 3, 2, 1      ASK your doctor about these medications       acetaminophen 500 MG tablet  Commonly known as:  TYLENOL  Take 1,000 mg by mouth every 6 (six) hours as needed for pain.     cyclobenzaprine 5 MG tablet    Commonly known as:  FLEXERIL  Take 1 tablet (5 mg total) by mouth 3 (three) times daily as needed for muscle spasms.     ibuprofen 600 MG tablet  Commonly known as:  ADVIL,MOTRIN  Take 1 tablet (600 mg total) by mouth every 8 (eight) hours as needed.     medroxyPROGESTERone 150 MG/ML injection  Commonly known as:  DEPO-PROVERA  Inject 150 mg into the muscle every 3 (three)  months.         [address] [place], [place] 07/15/13 [phone]

## 2013-07-15 NOTE — ED Notes (Signed)
Pt states she was here three days ago for back pain. States it is worse now and the pain is in her right shoulder. Also, states she stepped on a nail last night.

## 2013-07-15 NOTE — ED Notes (Signed)
Pt alert & oriented x4, stable gait. Patient given discharge instructions, paperwork & prescription(s). Patient  instructed to stop at the registration desk to finish any additional paperwork. Patient verbalized understanding. Pt left department w/ no further questions. 

## 2013-08-02 ENCOUNTER — Emergency Department (HOSPITAL_COMMUNITY)
Admission: EM | Admit: 2013-08-02 | Discharge: 2013-08-02 | Disposition: A | Discharge status: Home / Self Care | Payer: Medicaid Other | Attending: Emergency Medicine | Admitting: Emergency Medicine

## 2013-08-02 ENCOUNTER — Encounter (HOSPITAL_COMMUNITY): Payer: Self-pay | Admitting: Emergency Medicine

## 2013-08-02 DIAGNOSIS — X500XXA Overexertion from strenuous movement or load, initial encounter: Secondary | ICD-10-CM | POA: Insufficient documentation

## 2013-08-02 DIAGNOSIS — Z8639 Personal history of other endocrine, nutritional and metabolic disease: Secondary | ICD-10-CM | POA: Insufficient documentation

## 2013-08-02 DIAGNOSIS — Y92009 Unspecified place in unspecified non-institutional (private) residence as the place of occurrence of the external cause: Secondary | ICD-10-CM | POA: Insufficient documentation

## 2013-08-02 DIAGNOSIS — Z87448 Personal history of other diseases of urinary system: Secondary | ICD-10-CM | POA: Insufficient documentation

## 2013-08-02 DIAGNOSIS — M62830 Muscle spasm of back: Secondary | ICD-10-CM

## 2013-08-02 DIAGNOSIS — IMO0002 Reserved for concepts with insufficient information to code with codable children: Secondary | ICD-10-CM | POA: Insufficient documentation

## 2013-08-02 DIAGNOSIS — Z862 Personal history of diseases of the blood and blood-forming organs and certain disorders involving the immune mechanism: Secondary | ICD-10-CM | POA: Insufficient documentation

## 2013-08-02 DIAGNOSIS — Z8679 Personal history of other diseases of the circulatory system: Secondary | ICD-10-CM | POA: Insufficient documentation

## 2013-08-02 DIAGNOSIS — S335XXA Sprain of ligaments of lumbar spine, initial encounter: Secondary | ICD-10-CM | POA: Insufficient documentation

## 2013-08-02 DIAGNOSIS — F17200 Nicotine dependence, unspecified, uncomplicated: Secondary | ICD-10-CM | POA: Insufficient documentation

## 2013-08-02 DIAGNOSIS — Y93H3 Activity, building and construction: Secondary | ICD-10-CM | POA: Insufficient documentation

## 2013-08-02 DIAGNOSIS — Z792 Long term (current) use of antibiotics: Secondary | ICD-10-CM | POA: Insufficient documentation

## 2013-08-02 DIAGNOSIS — Z8659 Personal history of other mental and behavioral disorders: Secondary | ICD-10-CM | POA: Insufficient documentation

## 2013-08-02 DIAGNOSIS — Z79899 Other long term (current) drug therapy: Secondary | ICD-10-CM | POA: Insufficient documentation

## 2013-08-02 MED ORDER — CYCLOBENZAPRINE HCL 5 MG PO TABS: 5.0000 mg | ORAL_TABLET | Freq: Three times a day (TID) | ORAL | Status: DC | PRN

## 2013-08-02 MED ORDER — CYCLOBENZAPRINE HCL 10 MG PO TABS
10.0000 mg | ORAL_TABLET | Freq: Once | ORAL | Status: AC
  Administered 2013-08-02: 10 mg via ORAL
  Filled 2013-08-02: qty 1

## 2013-08-02 MED ORDER — CYCLOBENZAPRINE HCL 10 MG PO TABS: 10.0000 mg | ORAL_TABLET | Freq: Two times a day (BID) | ORAL | Status: DC | PRN

## 2013-08-02 NOTE — ED Notes (Signed)
Pt co pulled muscle in back today, co spasms lower back

## 2013-08-02 NOTE — ED Provider Notes (Signed)
CSN: [id number]     Arrival date & time 08/02/13  1832 History   First MD Initiated Contact with Patient 08/02/13 1842     Chief Complaint  Patient presents with  . Back Pain   (Consider location/radiation/quality/duration/timing/severity/associated sxs/prior Treatment) HPI Pt is a 27yo female c/o lower back pain, stating "I think I pulled a muscle today while moving plywood." Pt states she is currently renovating her house. Around 1600 this afternoon, she picked up a piece of wood and her back "caught" causing throbbing stabbing lower back pain.  Pain is constant, waxes and wanes, 7/10.  Has tried ibuprofen at home with minimal relief.  Reports injuring her back last month and was given flexeril that did help. Does report hx of renal stones but states she does not believe that is the cause of her current pain as pain started after she picked up the wood.  Denies fevers, n/v/d. Denies change in bowel or bladder habits. Denies numbness or tingling in legs.  Past Medical History  Diagnosis Date  . Ovarian cyst   . Anxiety   . Bradycardia     pt says was seen at [facility] for bradycardia but was never put on any medication for it.  . Renal disorder    Past Surgical History  Procedure Laterality Date  . Cesarean section      x2  . Cholecystectomy N/A 03/03/2013    Procedure: LAPAROSCOPIC CHOLECYSTECTOMY;  Surgeon: Dalia Heading, MD;  Location: [facility];  Service: General;  Laterality: N/A;   History reviewed. No pertinent family history. History  Substance Use Topics  . Smoking status: Current Every Day Smoker -- 0.50 packs/day for 3 years    Types: Cigarettes  . Smokeless tobacco: Not on file  . Alcohol Use: No   OB History   Grav Para Term Preterm Abortions TAB SAB Ect Mult Living   2 2 2       2      Review of Systems  Constitutional: Negative for fever and chills.  Gastrointestinal: Negative for nausea, vomiting and abdominal pain.  Genitourinary: Negative for dysuria, urgency,  hematuria, flank pain and pelvic pain.  Musculoskeletal: Positive for back pain and myalgias. Negative for neck pain and neck stiffness.  Skin: Negative for rash and wound.  Neurological: Negative for weakness and numbness.  All other systems reviewed and are negative.    Allergies  Tramadol; Motrin; and Naprosyn  Home Medications   Current Outpatient Rx  Name  Route  Sig  Dispense  Refill  . acetaminophen (TYLENOL) 500 MG tablet   Oral   Take 1,000 mg by mouth every 6 (six) hours as needed for pain.         . ciprofloxacin (CIPRO) 500 MG tablet   Oral   Take 1 tablet (500 mg total) by mouth every 12 (twelve) hours.   14 tablet   0   . cyclobenzaprine (FLEXERIL) 5 MG tablet   Oral   Take 1 tablet (5 mg total) by mouth 3 (three) times daily as needed for muscle spasms.   10 tablet   0   . HYDROcodone-acetaminophen (NORCO/VICODIN) 5-325 MG per tablet   Oral   Take 1 tablet by mouth every 4 (four) hours as needed.   6 tablet   0   . ibuprofen (ADVIL,MOTRIN) 600 MG tablet   Oral   Take 1 tablet (600 mg total) by mouth every 8 (eight) hours as needed.   15 tablet   0   .  medroxyPROGESTERone (DEPO-PROVERA) 150 MG/ML injection   Intramuscular   Inject 150 mg into the muscle every 3 (three) months.         . predniSONE (STERAPRED UNI-PAK) 10 MG tablet   Oral   Take by mouth daily. Take 6 tablets today then 5, 4, 3, 2, 1   21 tablet   0    BP 112/67  Pulse 56  Temp(Src) 97.9 F (36.6 C) (Oral)  Resp 20  Ht 5\' 6"  (1.676 m)  Wt 213 lb (96.616 kg)  BMI 34.40 kg/m2  SpO2 100% Physical Exam  Nursing note and vitals reviewed. Constitutional: She is oriented to person, place, and time. She appears well-developed and well-nourished.  HENT:  Head: Normocephalic and atraumatic.  Eyes: EOM are normal.  Neck: Normal range of motion.  Cardiovascular: Normal rate.   Pulmonary/Chest: Effort normal.  Abdominal: Soft. There is no tenderness.  Musculoskeletal: Normal  range of motion. She exhibits tenderness. She exhibits no edema.  Lower lumbar muscular tenderness. No midline spinal tenderness.  Normal gait.  Neurological: She is alert and oriented to person, place, and time.  Skin: Skin is warm and dry. No rash noted. No erythema.  Psychiatric: She has a normal mood and affect. Her behavior is normal.    ED Course  Procedures (including critical care time) Labs Review Labs Reviewed - No data to display Imaging Review No results found.  EKG Interpretation   None       MDM   1. Spasm of muscle, back    Pt presenting wit muscular pain. Normal gait. No midlinespinal tenderness. Denies numbness or tingling in legs or groin.  Rx: flexeril.  Return precautions provided. Discussed f/u care with PCP.  Pt states she does have resource guide but still needs to established a PCP as her previous one is in [place].     Junius Finner, PA-C 08/02/13 [phone]

## 2013-08-05 ENCOUNTER — Encounter (HOSPITAL_COMMUNITY): Payer: Self-pay | Admitting: Emergency Medicine

## 2013-08-05 ENCOUNTER — Emergency Department (HOSPITAL_COMMUNITY)
Admission: EM | Admit: 2013-08-05 | Discharge: 2013-08-05 | Disposition: A | Discharge status: Home / Self Care | Payer: Medicaid Other | Attending: Emergency Medicine | Admitting: Emergency Medicine

## 2013-08-05 DIAGNOSIS — IMO0002 Reserved for concepts with insufficient information to code with codable children: Secondary | ICD-10-CM | POA: Insufficient documentation

## 2013-08-05 DIAGNOSIS — Z87448 Personal history of other diseases of urinary system: Secondary | ICD-10-CM | POA: Insufficient documentation

## 2013-08-05 DIAGNOSIS — Z8659 Personal history of other mental and behavioral disorders: Secondary | ICD-10-CM | POA: Insufficient documentation

## 2013-08-05 DIAGNOSIS — Z8742 Personal history of other diseases of the female genital tract: Secondary | ICD-10-CM | POA: Insufficient documentation

## 2013-08-05 DIAGNOSIS — F17200 Nicotine dependence, unspecified, uncomplicated: Secondary | ICD-10-CM | POA: Insufficient documentation

## 2013-08-05 DIAGNOSIS — Z8679 Personal history of other diseases of the circulatory system: Secondary | ICD-10-CM | POA: Insufficient documentation

## 2013-08-05 DIAGNOSIS — L02419 Cutaneous abscess of limb, unspecified: Secondary | ICD-10-CM | POA: Insufficient documentation

## 2013-08-05 DIAGNOSIS — Z792 Long term (current) use of antibiotics: Secondary | ICD-10-CM | POA: Insufficient documentation

## 2013-08-05 DIAGNOSIS — L02416 Cutaneous abscess of left lower limb: Secondary | ICD-10-CM

## 2013-08-05 MED ORDER — LIDOCAINE HCL (PF) 1 % IJ SOLN
5.0000 mL | Freq: Once | INTRAMUSCULAR | Status: AC
  Administered 2013-08-05: 5 mL via INTRADERMAL
  Filled 2013-08-05: qty 5

## 2013-08-05 MED ORDER — SULFAMETHOXAZOLE-TRIMETHOPRIM 800-160 MG PO TABS: 1.0000 | ORAL_TABLET | Freq: Two times a day (BID) | ORAL | Status: DC

## 2013-08-05 MED ORDER — HYDROCODONE-ACETAMINOPHEN 5-325 MG PO TABS
1.0000 | ORAL_TABLET | Freq: Once | ORAL | Status: AC
  Administered 2013-08-05: 1 via ORAL
  Filled 2013-08-05: qty 1

## 2013-08-05 MED ORDER — HYDROCODONE-ACETAMINOPHEN 5-325 MG PO TABS: ORAL_TABLET | ORAL | Status: DC

## 2013-08-05 MED ORDER — SULFAMETHOXAZOLE-TMP DS 800-160 MG PO TABS
1.0000 | ORAL_TABLET | Freq: Once | ORAL | Status: AC
  Administered 2013-08-05: 1 via ORAL
  Filled 2013-08-05: qty 1

## 2013-08-05 NOTE — ED Provider Notes (Signed)
CSN: [id number]     Arrival date & time 08/05/13  1802 History   First MD Initiated Contact with Patient 08/05/13 1809     Chief Complaint  Patient presents with  . Abscess   (Consider location/radiation/quality/duration/timing/severity/associated sxs/prior Treatment) Patient is a 27 y.o. female presenting with abscess. The history is provided by the patient.  Abscess Location:  Leg Leg abscess location:  L upper leg Abscess quality: fluctuance, painful and redness   Abscess quality: not draining, no induration and no itching   Red streaking: no   Duration:  4 days Progression:  Worsening Pain details:    Quality:  Sharp and aching   Severity:  Moderate   Duration:  4 days   Timing:  Constant   Progression:  Worsening Chronicity:  New Context: not diabetes   Relieved by:  Nothing Worsened by:  Tar ointment and warm compresses Associated symptoms: no fever, no headaches, no nausea and no vomiting   Risk factors: prior abscess   Risk factors: no hx of MRSA     Past Medical History  Diagnosis Date  . Ovarian cyst   . Anxiety   . Bradycardia     pt says was seen at [facility] for bradycardia but was never put on any medication for it.  . Renal disorder    Past Surgical History  Procedure Laterality Date  . Cesarean section      x2  . Cholecystectomy N/A 03/03/2013    Procedure: LAPAROSCOPIC CHOLECYSTECTOMY;  Surgeon: Dalia Heading, MD;  Location: [facility];  Service: General;  Laterality: N/A;   No family history on file. History  Substance Use Topics  . Smoking status: Current Every Day Smoker -- 0.50 packs/day for 3 years    Types: Cigarettes  . Smokeless tobacco: Not on file  . Alcohol Use: No   OB History   Grav Para Term Preterm Abortions TAB SAB Ect Mult Living   2 2 2       2      Review of Systems  Constitutional: Negative for fever and chills.  Gastrointestinal: Negative for nausea and vomiting.  Musculoskeletal: Negative for arthralgias and joint swelling.   Skin: Positive for color change.       Abscess   Neurological: Negative for headaches.  Hematological: Negative for adenopathy.  All other systems reviewed and are negative.    Allergies  Tramadol; Motrin; and Naprosyn  Home Medications   Current Outpatient Rx  Name  Route  Sig  Dispense  Refill  . acetaminophen (TYLENOL) 500 MG tablet   Oral   Take 1,000 mg by mouth every 6 (six) hours as needed for pain.         . ciprofloxacin (CIPRO) 500 MG tablet   Oral   Take 1 tablet (500 mg total) by mouth every 12 (twelve) hours.   14 tablet   0   . cyclobenzaprine (FLEXERIL) 5 MG tablet   Oral   Take 1 tablet (5 mg total) by mouth 3 (three) times daily as needed for muscle spasms.   10 tablet   0   . HYDROcodone-acetaminophen (NORCO/VICODIN) 5-325 MG per tablet   Oral   Take 1 tablet by mouth every 4 (four) hours as needed.   6 tablet   0   . ibuprofen (ADVIL,MOTRIN) 600 MG tablet   Oral   Take 1 tablet (600 mg total) by mouth every 8 (eight) hours as needed.   15 tablet   0   .  medroxyPROGESTERone (DEPO-PROVERA) 150 MG/ML injection   Intramuscular   Inject 150 mg into the muscle every 3 (three) months.         . predniSONE (STERAPRED UNI-PAK) 10 MG tablet   Oral   Take by mouth daily. Take 6 tablets today then 5, 4, 3, 2, 1   21 tablet   0    BP 106/63  Pulse 97  Temp(Src) 98.7 F (37.1 C) (Oral)  Resp 18  Ht 5\' 6"  (1.676 m)  Wt 230 lb (104.327 kg)  BMI 37.14 kg/m2  SpO2 100% Physical Exam  Nursing note and vitals reviewed. Constitutional: She is oriented to person, place, and time. She appears well-developed and well-nourished. No distress.  HENT:  Head: Normocephalic and atraumatic.  Cardiovascular: Normal rate, regular rhythm and normal heart sounds.   No murmur heard. Pulmonary/Chest: Effort normal and breath sounds normal. No respiratory distress.  Musculoskeletal: Normal range of motion.  Neurological: She is alert and oriented to person,  place, and time. She exhibits normal muscle tone. Coordination normal.  Skin: Skin is warm and dry. There is erythema.  Abscess to the left medial thigh with surrounding erythema, no lymphangitis, or drainage    ED Course  Procedures (including critical care time) Labs Review Labs Reviewed - No data to display Imaging Review No results found.  EKG Interpretation   None       MDM    INCISION AND DRAINAGE Performed by: Pauline Aus L. Consent: Verbal consent obtained. Risks and benefits: risks, benefits and alternatives were discussed Type: abscess  Body area: left medial thigh  Anesthesia: local infiltration  Incision was made with a #11 scalpel.  Local anesthetic: lidocaine 1% w/o epinephrine  Anesthetic total: 3 ml  Complexity: complex Blunt dissection to break up loculations  Drainage: purulent  Drainage amount: moderate  Packing material: 1/4 in iodoform gauze  Patient tolerance: Patient tolerated the procedure well with no immediate complications.     Patient with abscess to the medial thigh, pain improved after I&D.  Ambulates w/o difficulty.  VSS.  Agrees to bactrim, vicodin #12 , warm wet compresses and to return here if needed.  Appears stable for d/c  Zonia Caplin L. Trisha Mangle, PA-C 08/05/13 1848

## 2013-08-05 NOTE — ED Notes (Signed)
Pt reports a boil on the left inner thigh for the past 4 days that has not come to a head.

## 2013-08-06 NOTE — ED Provider Notes (Signed)
Medical screening examination/treatment/procedure(s) were performed by non-physician practitioner and as supervising physician I was immediately available for consultation/collaboration.  EKG Interpretation   None         Charles B. Sheldon, MD 08/06/13 1323 

## 2013-08-07 ENCOUNTER — Encounter (HOSPITAL_COMMUNITY): Payer: Self-pay | Admitting: Emergency Medicine

## 2013-08-07 DIAGNOSIS — Z8742 Personal history of other diseases of the female genital tract: Secondary | ICD-10-CM | POA: Insufficient documentation

## 2013-08-07 DIAGNOSIS — Z792 Long term (current) use of antibiotics: Secondary | ICD-10-CM | POA: Insufficient documentation

## 2013-08-07 DIAGNOSIS — IMO0002 Reserved for concepts with insufficient information to code with codable children: Secondary | ICD-10-CM | POA: Insufficient documentation

## 2013-08-07 DIAGNOSIS — L539 Erythematous condition, unspecified: Secondary | ICD-10-CM | POA: Insufficient documentation

## 2013-08-07 DIAGNOSIS — Z79899 Other long term (current) drug therapy: Secondary | ICD-10-CM | POA: Insufficient documentation

## 2013-08-07 DIAGNOSIS — F17200 Nicotine dependence, unspecified, uncomplicated: Secondary | ICD-10-CM | POA: Insufficient documentation

## 2013-08-07 DIAGNOSIS — F411 Generalized anxiety disorder: Secondary | ICD-10-CM | POA: Insufficient documentation

## 2013-08-07 DIAGNOSIS — Z4801 Encounter for change or removal of surgical wound dressing: Secondary | ICD-10-CM | POA: Insufficient documentation

## 2013-08-07 NOTE — ED Notes (Signed)
Pt states abscess is infected where I&D done on Sunday to left upper thigh

## 2013-08-08 ENCOUNTER — Emergency Department (HOSPITAL_COMMUNITY)
Admission: EM | Admit: 2013-08-08 | Discharge: 2013-08-08 | Disposition: A | Discharge status: Home / Self Care | Payer: Medicaid Other | Attending: Emergency Medicine | Admitting: Emergency Medicine

## 2013-08-08 DIAGNOSIS — Z5189 Encounter for other specified aftercare: Secondary | ICD-10-CM

## 2013-08-08 NOTE — ED Provider Notes (Signed)
CSN: [id number]     Arrival date & time 08/07/13  2223 History   First MD Initiated Contact with Patient 08/08/13 0208     Chief Complaint  Patient presents with  . Wound Check   (Consider location/radiation/quality/duration/timing/severity/associated sxs/prior Treatment) Patient is a 27 y.o. female presenting with wound check. The history is provided by the patient.  Wound Check Pertinent negatives include no chest pain, no abdominal pain and no shortness of breath.   patient presents for followup and wound check of abscess that was I&D on December 13. The packing was removed today. Patient was concerned that it was still infected. Here for recheck. Patient has been on Septra DS.  Past Medical History  Diagnosis Date  . Ovarian cyst   . Anxiety   . Bradycardia     pt says was seen at [facility] for bradycardia but was never put on any medication for it.  . Renal disorder    Past Surgical History  Procedure Laterality Date  . Cesarean section      x2  . Cholecystectomy N/A 03/03/2013    Procedure: LAPAROSCOPIC CHOLECYSTECTOMY;  Surgeon: Dalia Heading, MD;  Location: [facility];  Service: General;  Laterality: N/A;   History reviewed. No pertinent family history. History  Substance Use Topics  . Smoking status: Current Every Day Smoker -- 0.50 packs/day for 3 years    Types: Cigarettes  . Smokeless tobacco: Not on file  . Alcohol Use: No   OB History   Grav Para Term Preterm Abortions TAB SAB Ect Mult Living   2 2 2       2      Review of Systems  Constitutional: Negative for fever.  Respiratory: Negative for shortness of breath.   Cardiovascular: Negative for chest pain.  Gastrointestinal: Negative for nausea, vomiting and abdominal pain.  Skin: Positive for wound. Negative for rash.  Hematological: Does not bruise/bleed easily.  Psychiatric/Behavioral: Negative for confusion.    Allergies  Tramadol; Motrin; and Naprosyn  Home Medications   Current Outpatient Rx  Name   Route  Sig  Dispense  Refill  . acetaminophen (TYLENOL) 500 MG tablet   Oral   Take 1,000 mg by mouth every 6 (six) hours as needed for pain.         . ciprofloxacin (CIPRO) 500 MG tablet   Oral   Take 1 tablet (500 mg total) by mouth every 12 (twelve) hours.   14 tablet   0   . cyclobenzaprine (FLEXERIL) 5 MG tablet   Oral   Take 1 tablet (5 mg total) by mouth 3 (three) times daily as needed for muscle spasms.   10 tablet   0   . HYDROcodone-acetaminophen (NORCO/VICODIN) 5-325 MG per tablet   Oral   Take 1 tablet by mouth every 4 (four) hours as needed.   6 tablet   0   . HYDROcodone-acetaminophen (NORCO/VICODIN) 5-325 MG per tablet      Take one-two tabs po q 4-6 hrs prn pain   12 tablet   0   . ibuprofen (ADVIL,MOTRIN) 600 MG tablet   Oral   Take 1 tablet (600 mg total) by mouth every 8 (eight) hours as needed.   15 tablet   0   . medroxyPROGESTERone (DEPO-PROVERA) 150 MG/ML injection   Intramuscular   Inject 150 mg into the muscle every 3 (three) months.         . predniSONE (STERAPRED UNI-PAK) 10 MG tablet   Oral  Take by mouth daily. Take 6 tablets today then 5, 4, 3, 2, 1   21 tablet   0   . sulfamethoxazole-trimethoprim (SEPTRA DS) 800-160 MG per tablet   Oral   Take 1 tablet by mouth 2 (two) times daily. For 10 days   20 tablet   0    BP 111/73  Pulse 100  Temp(Src) 97.6 F (36.4 C) (Oral)  Resp 20  Ht 5\' 6"  (1.676 m)  Wt 230 lb (104.327 kg)  BMI 37.14 kg/m2  SpO2 100% Physical Exam  Nursing note and vitals reviewed. Constitutional: She is oriented to person, place, and time. She appears well-developed and well-nourished.  HENT:  Head: Normocephalic and atraumatic.  Eyes: Conjunctivae are normal.  Cardiovascular: Normal rate, regular rhythm and normal heart sounds.   Pulmonary/Chest: Effort normal and breath sounds normal.  Abdominal: Soft. Bowel sounds are normal. There is no tenderness.  Musculoskeletal: Normal range of motion.   Normal except for left inner thigh and I&D site. Packing removed today very faint erythema minimal induration no evidence of any additional fluctuance. Appears to be healing well.  Neurological: She is alert and oriented to person, place, and time. No cranial nerve deficit. She exhibits normal muscle tone. Coordination normal.  Skin: Skin is warm. There is erythema.    ED Course  Procedures (including critical care time) Labs Review Labs Reviewed - No data to display Imaging Review No results found.  EKG Interpretation   None       MDM   1. Wound check, abscess    I&D of in her thigh abscess on the left healing well no evidence of recurring infection. Patient continue the Septra. Patient removed the packing today. Precautions provided.    [name], MD 08/08/13 [phone]

## 2013-08-08 NOTE — ED Notes (Signed)
Patient given discharge instruction, verbalized understand. Patient ambulatory out of the department.  

## 2013-08-09 NOTE — ED Provider Notes (Signed)
Medical screening examination/treatment/procedure(s) were performed by non-physician practitioner and as supervising physician I was immediately available for consultation/collaboration.  EKG Interpretation   None         [name], MD 08/09/13 [phone]

## 2013-08-14 ENCOUNTER — Emergency Department (HOSPITAL_COMMUNITY): Payer: Medicaid Other

## 2013-08-14 ENCOUNTER — Emergency Department (HOSPITAL_COMMUNITY)
Admission: EM | Admit: 2013-08-14 | Discharge: 2013-08-14 | Disposition: A | Discharge status: Home / Self Care | Payer: Medicaid Other | Attending: Emergency Medicine | Admitting: Emergency Medicine

## 2013-08-14 ENCOUNTER — Encounter (HOSPITAL_COMMUNITY): Payer: Self-pay | Admitting: Emergency Medicine

## 2013-08-14 DIAGNOSIS — Z87448 Personal history of other diseases of urinary system: Secondary | ICD-10-CM | POA: Insufficient documentation

## 2013-08-14 DIAGNOSIS — S59909A Unspecified injury of unspecified elbow, initial encounter: Secondary | ICD-10-CM | POA: Insufficient documentation

## 2013-08-14 DIAGNOSIS — Y939 Activity, unspecified: Secondary | ICD-10-CM | POA: Insufficient documentation

## 2013-08-14 DIAGNOSIS — F17200 Nicotine dependence, unspecified, uncomplicated: Secondary | ICD-10-CM | POA: Insufficient documentation

## 2013-08-14 DIAGNOSIS — M25531 Pain in right wrist: Secondary | ICD-10-CM

## 2013-08-14 DIAGNOSIS — Z8742 Personal history of other diseases of the female genital tract: Secondary | ICD-10-CM | POA: Insufficient documentation

## 2013-08-14 DIAGNOSIS — S6990XA Unspecified injury of unspecified wrist, hand and finger(s), initial encounter: Secondary | ICD-10-CM | POA: Insufficient documentation

## 2013-08-14 DIAGNOSIS — R52 Pain, unspecified: Secondary | ICD-10-CM | POA: Insufficient documentation

## 2013-08-14 DIAGNOSIS — Z8659 Personal history of other mental and behavioral disorders: Secondary | ICD-10-CM | POA: Insufficient documentation

## 2013-08-14 DIAGNOSIS — Y92009 Unspecified place in unspecified non-institutional (private) residence as the place of occurrence of the external cause: Secondary | ICD-10-CM | POA: Insufficient documentation

## 2013-08-14 DIAGNOSIS — W010XXA Fall on same level from slipping, tripping and stumbling without subsequent striking against object, initial encounter: Secondary | ICD-10-CM | POA: Insufficient documentation

## 2013-08-14 MED ORDER — IBUPROFEN 600 MG PO TABS: 600.0000 mg | ORAL_TABLET | Freq: Four times a day (QID) | ORAL | Status: DC | PRN

## 2013-08-14 NOTE — ED Notes (Signed)
Pt c/o pain to r wrist after tripping over a toy and falling yesterday.

## 2013-08-14 NOTE — ED Provider Notes (Signed)
Medical screening examination/treatment/procedure(s) were performed by non-physician practitioner and as supervising physician I was immediately available for consultation/collaboration.  Flint Melter, MD 08/14/13 2121

## 2013-08-14 NOTE — ED Provider Notes (Signed)
CSN: [id number]     Arrival date & time 08/14/13  1752 History   First MD Initiated Contact with Patient 08/14/13 1800     Chief Complaint  Patient presents with  . Wrist Pain   (Consider location/radiation/quality/duration/timing/severity/associated sxs/prior Treatment) HPI Comments: Valerie Robinson is a 27 y.o. Female presenting with pain in her right wrist since tripping over her childs toy wagon in her home yesterday,  And landing on her outstretched hands. Her pain is constant and worse with movement and palpation.  There is no radiation of pain and she denies pain in her elbow and shoulder.  She has taken tylenol without relief of symtpoms.  She denies numbness in her fingers and hand.     The history is provided by the patient.    Past Medical History  Diagnosis Date  . Ovarian cyst   . Anxiety   . Bradycardia     pt says was seen at [facility] for bradycardia but was never put on any medication for it.  . Renal disorder    Past Surgical History  Procedure Laterality Date  . Cesarean section      x2  . Cholecystectomy N/A 03/03/2013    Procedure: LAPAROSCOPIC CHOLECYSTECTOMY;  Surgeon: Dalia Heading, MD;  Location: [facility];  Service: General;  Laterality: N/A;   No family history on file. History  Substance Use Topics  . Smoking status: Current Every Day Smoker -- 0.50 packs/day for 3 years    Types: Cigarettes  . Smokeless tobacco: Not on file  . Alcohol Use: No   OB History   Grav Para Term Preterm Abortions TAB SAB Ect Mult Living   2 2 2       2      Review of Systems  Constitutional: Negative for fever.  Musculoskeletal: Positive for arthralgias. Negative for joint swelling and myalgias.  Neurological: Negative for weakness and numbness.    Allergies  Tramadol; Motrin; and Naprosyn  Home Medications   Current Outpatient Rx  Name  Route  Sig  Dispense  Refill  . acetaminophen (TYLENOL) 500 MG tablet   Oral   Take 1,000 mg by mouth every 6 (six) hours as  needed for pain.         . medroxyPROGESTERone (DEPO-PROVERA) 150 MG/ML injection   Intramuscular   Inject 150 mg into the muscle every 3 (three) months.         Marland Kitchen ibuprofen (ADVIL,MOTRIN) 600 MG tablet   Oral   Take 1 tablet (600 mg total) by mouth every 6 (six) hours as needed.   30 tablet   0    BP 123/67  Pulse 88  Temp(Src) 97.8 F (36.6 C) (Oral)  Resp 24  Ht 5\' 6"  (1.676 m)  Wt 220 lb (99.791 kg)  BMI 35.53 kg/m2  SpO2 100% Physical Exam  Constitutional: She appears well-developed and well-nourished.  HENT:  Head: Atraumatic.  Neck: Normal range of motion.  Cardiovascular:  Pulses equal bilaterally  Musculoskeletal: She exhibits tenderness. She exhibits no edema.       Right wrist: She exhibits decreased range of motion and tenderness. She exhibits no swelling, no effusion, no crepitus and no deformity.  ttp right wrist over distal radius.  She does have snuff box tenderness and pain with resisted flexion and extension of her thumb at this location as well.  No edema, no ecchymosis.  Less than 3 sec cap refill in finger tips.    Neurological: She is alert.  She has normal strength. She displays normal reflexes. No sensory deficit.  Equal strength  Skin: Skin is warm and dry.  Psychiatric: She has a normal mood and affect.    ED Course  Procedures (including critical care time) Labs Review Labs Reviewed - No data to display Imaging Review Dg Wrist Complete Right  08/14/2013   CLINICAL DATA:  Fall yesterday.  Right wrist pain.  EXAM: RIGHT WRIST - COMPLETE 3+ VIEW  COMPARISON:  01/25/2013  FINDINGS: There is no evidence of fracture or dislocation. There is no evidence of arthropathy or other focal bone abnormality. Soft tissues are unremarkable.  IMPRESSION: Negative.   Electronically Signed   By: Amie Portland M.D.   On: 08/14/2013 18:20    EKG Interpretation   None       MDM   1. Wrist pain, acute, right    Patients labs and/or radiological studies  were viewed and considered during the medical decision making and disposition process. Pt was placed in thumb spica splint.  Discussed possible occult fracture of the scaphoid, encouraged recheck by ortho or her pcp if sx are not improving over the next 10 days.      Burgess Amor, PA-C 08/14/13 1901

## 2013-08-24 ENCOUNTER — Encounter (HOSPITAL_COMMUNITY): Payer: Self-pay | Admitting: Emergency Medicine

## 2013-08-24 ENCOUNTER — Emergency Department (HOSPITAL_COMMUNITY)
Admission: EM | Admit: 2013-08-24 | Discharge: 2013-08-24 | Disposition: A | Discharge status: Home / Self Care | Payer: Medicaid Other | Attending: Emergency Medicine | Admitting: Emergency Medicine

## 2013-08-24 DIAGNOSIS — Z8742 Personal history of other diseases of the female genital tract: Secondary | ICD-10-CM | POA: Insufficient documentation

## 2013-08-24 DIAGNOSIS — F17200 Nicotine dependence, unspecified, uncomplicated: Secondary | ICD-10-CM | POA: Insufficient documentation

## 2013-08-24 DIAGNOSIS — N390 Urinary tract infection, site not specified: Secondary | ICD-10-CM

## 2013-08-24 DIAGNOSIS — Z8679 Personal history of other diseases of the circulatory system: Secondary | ICD-10-CM | POA: Insufficient documentation

## 2013-08-24 DIAGNOSIS — F411 Generalized anxiety disorder: Secondary | ICD-10-CM | POA: Insufficient documentation

## 2013-08-24 DIAGNOSIS — Z765 Malingerer [conscious simulation]: Secondary | ICD-10-CM

## 2013-08-24 HISTORY — DX: Malingerer (conscious simulation): Z76.5

## 2013-08-24 LAB — URINALYSIS, ROUTINE W REFLEX MICROSCOPIC
BILIRUBIN URINE: NEGATIVE
Glucose, UA: NEGATIVE mg/dL
NITRITE: POSITIVE — AB
Protein, ur: NEGATIVE mg/dL
Specific Gravity, Urine: >1.03 — ABNORMAL HIGH (ref 1.005–1.030)
UROBILINOGEN UA: 0.2 mg/dL (ref 0.0–1.0)
pH: 5.5 (ref 5.0–8.0)

## 2013-08-24 LAB — URINE MICROSCOPIC-ADD ON

## 2013-08-24 MED ORDER — PHENAZOPYRIDINE HCL 100 MG PO TABS
200.0000 mg | ORAL_TABLET | Freq: Once | ORAL | Status: AC
  Administered 2013-08-24: 200 mg via ORAL
  Filled 2013-08-24: qty 2

## 2013-08-24 MED ORDER — PHENAZOPYRIDINE HCL 95 MG PO TABS: 95.0000 mg | ORAL_TABLET | Freq: Three times a day (TID) | ORAL | Status: DC | PRN

## 2013-08-24 MED ORDER — CEPHALEXIN 500 MG PO CAPS: 500.0000 mg | ORAL_CAPSULE | Freq: Four times a day (QID) | ORAL | Status: DC

## 2013-08-24 MED ORDER — ONDANSETRON HCL 4 MG PO TABS
4.0000 mg | ORAL_TABLET | Freq: Once | ORAL | Status: AC
  Administered 2013-08-24: 4 mg via ORAL
  Filled 2013-08-24: qty 1

## 2013-08-24 MED ORDER — CEPHALEXIN 500 MG PO CAPS
500.0000 mg | ORAL_CAPSULE | Freq: Once | ORAL | Status: AC
  Administered 2013-08-24: 500 mg via ORAL
  Filled 2013-08-24: qty 1

## 2013-08-24 NOTE — ED Provider Notes (Signed)
CSN: [id number]     Arrival date & time 08/24/13  1609 History   First MD Initiated Contact with Patient 08/24/13 1617     Chief Complaint  Patient presents with  . Dysuria   (Consider location/radiation/quality/duration/timing/severity/associated sxs/prior Treatment) Patient is a 28 y.o. female presenting with dysuria. The history is provided by the patient.  Dysuria Pain quality:  Burning Pain severity:  Moderate Onset quality:  Gradual Duration:  1 day Timing:  Intermittent Progression:  Worsening Chronicity:  New Recent urinary tract infections: no   Relieved by:  Nothing Worsened by:  Nothing tried Ineffective treatments:  None tried Urinary symptoms: frequent urination   Associated symptoms: no abdominal pain, no fever, no genital lesions and no vomiting   Risk factors: sexually active   Risk factors: no hx of pyelonephritis and not pregnant     Past Medical History  Diagnosis Date  . Ovarian cyst   . Anxiety   . Bradycardia     pt says was seen at [facility] for bradycardia but was never put on any medication for it.  . Renal disorder    Past Surgical History  Procedure Laterality Date  . Cesarean section      x2  . Cholecystectomy N/A 03/03/2013    Procedure: LAPAROSCOPIC CHOLECYSTECTOMY;  Surgeon: Dalia Heading, MD;  Location: [facility];  Service: General;  Laterality: N/A;   History reviewed. No pertinent family history. History  Substance Use Topics  . Smoking status: Current Every Day Smoker -- 0.50 packs/day for 3 years    Types: Cigarettes  . Smokeless tobacco: Not on file  . Alcohol Use: No   OB History   Grav Para Term Preterm Abortions TAB SAB Ect Mult Living   2 2 2       2      Review of Systems  Constitutional: Negative for fever and activity change.       All ROS Neg except as noted in HPI  HENT: Negative for nosebleeds.   Eyes: Negative for photophobia and discharge.  Respiratory: Negative for cough, shortness of breath and wheezing.    Cardiovascular: Negative for chest pain and palpitations.  Gastrointestinal: Negative for vomiting, abdominal pain and blood in stool.  Genitourinary: Positive for dysuria. Negative for frequency and hematuria.  Musculoskeletal: Negative for arthralgias, back pain and neck pain.  Skin: Negative.   Neurological: Negative for dizziness, seizures and speech difficulty.  Psychiatric/Behavioral: Negative for hallucinations and confusion. The patient is nervous/anxious.     Allergies  Tramadol; Motrin; and Naprosyn  Home Medications   Current Outpatient Rx  Name  Route  Sig  Dispense  Refill  . acetaminophen (TYLENOL) 500 MG tablet   Oral   Take 1,000 mg by mouth every 6 (six) hours as needed for pain.         Marland Kitchen ibuprofen (ADVIL,MOTRIN) 600 MG tablet   Oral   Take 1 tablet (600 mg total) by mouth every 6 (six) hours as needed.   30 tablet   0   . medroxyPROGESTERone (DEPO-PROVERA) 150 MG/ML injection   Intramuscular   Inject 150 mg into the muscle every 3 (three) months.          BP 124/62  Pulse 61  Temp(Src) 97.4 F (36.3 C) (Oral)  Resp 18  Ht 5\' 6"  (1.676 m)  Wt 220 lb (99.791 kg)  BMI 35.53 kg/m2  SpO2 100% Physical Exam  Nursing note and vitals reviewed. Constitutional: She is oriented to person, place,  and time. She appears well-developed and well-nourished.  Non-toxic appearance.  HENT:  Head: Normocephalic.  Right Ear: Tympanic membrane and external ear normal.  Left Ear: Tympanic membrane and external ear normal.  Eyes: EOM and lids are normal. Pupils are equal, round, and reactive to light.  Neck: Normal range of motion. Neck supple. Carotid bruit is not present.  Cardiovascular: Normal rate, regular rhythm, normal heart sounds, intact distal pulses and normal pulses.   Pulmonary/Chest: Breath sounds normal. No respiratory distress.  Abdominal: Soft. Bowel sounds are normal. There is no tenderness. There is no guarding.  Musculoskeletal: Normal range of  motion.  Lymphadenopathy:       Head (right side): No submandibular adenopathy present.       Head (left side): No submandibular adenopathy present.    She has no cervical adenopathy.  Neurological: She is alert and oriented to person, place, and time. She has normal strength. No cranial nerve deficit or sensory deficit.  Skin: Skin is warm and dry.  Psychiatric: She has a normal mood and affect. Her speech is normal.    ED Course  Procedures (including critical care time) Labs Review Labs Reviewed - No data to display Imaging Review No results found.  EKG Interpretation   None       MDM  No diagnosis found. **I have reviewed nursing notes, vital signs, and all appropriate lab and imaging results for this patient.*  Urinalysis reveals a clear yellow specimen with a specific gravity of 1.030. Trace of hemoglobin is present, trace of ketones present, positive nitrates present, and trace of leukocyte esterase. There are 11-20 white blood cells, 3-6 red blood cells, and few bacteria.  Vital signs are stable. Pulse oximetry 100% on room air. Within normal limits by my interpretation.  Culture sent to the Lab. Rx for keflex and pyridium given. Pt will have urine rechecked in 7 to 10 days.  Kathie DikeHobson M Kavonte Bearse, PA-C 08/29/13 1211

## 2013-08-24 NOTE — Discharge Instructions (Signed)
Your urine test is significant for urinary tract infection. Please increase fluids. Please use Keflex 4 times daily with food until all taken. Lesion use Pyridium 3 times daily after a meal. Please have your urine rechecked in 7-10 days. Urinary Tract Infection Urinary tract infections (UTIs) can develop anywhere along your urinary tract. Your urinary tract is your body's drainage system for removing wastes and extra water. Your urinary tract includes two kidneys, two ureters, a bladder, and a urethra. Your kidneys are a pair of bean-shaped organs. Each kidney is about the size of your fist. They are located below your ribs, one on each side of your spine. CAUSES Infections are caused by microbes, which are microscopic organisms, including fungi, viruses, and bacteria. These organisms are so small that they can only be seen through a microscope. Bacteria are the microbes that most commonly cause UTIs. SYMPTOMS  Symptoms of UTIs may vary by age and gender of the patient and by the location of the infection. Symptoms in young women typically include a frequent and intense urge to urinate and a painful, burning feeling in the bladder or urethra during urination. Older women and men are more likely to be tired, shaky, and weak and have muscle aches and abdominal pain. A fever may mean the infection is in your kidneys. Other symptoms of a kidney infection include pain in your back or sides below the ribs, nausea, and vomiting. DIAGNOSIS To diagnose a UTI, your caregiver will ask you about your symptoms. Your caregiver also will ask to provide a urine sample. The urine sample will be tested for bacteria and white blood cells. White blood cells are made by your body to help fight infection. TREATMENT  Typically, UTIs can be treated with medication. Because most UTIs are caused by a bacterial infection, they usually can be treated with the use of antibiotics. The choice of antibiotic and length of treatment depend  on your symptoms and the type of bacteria causing your infection. HOME CARE INSTRUCTIONS  If you were prescribed antibiotics, take them exactly as your caregiver instructs you. Finish the medication even if you feel better after you have only taken some of the medication.  Drink enough water and fluids to keep your urine clear or pale yellow.  Avoid caffeine, tea, and carbonated beverages. They tend to irritate your bladder.  Empty your bladder often. Avoid holding urine for long periods of time.  Empty your bladder before and after sexual intercourse.  After a bowel movement, women should cleanse from front to back. Use each tissue only once. SEEK MEDICAL CARE IF:   You have back pain.  You develop a fever.  Your symptoms do not begin to resolve within 3 days. SEEK IMMEDIATE MEDICAL CARE IF:   You have severe back pain or lower abdominal pain.  You develop chills.  You have nausea or vomiting.  You have continued burning or discomfort with urination. MAKE SURE YOU:   Understand these instructions.  Will watch your condition.  Will get help right away if you are not doing well or get worse. Document Released: 05/20/2005 Document Revised: 02/09/2012 Document Reviewed: 09/18/2011 [facility] Patient Information 2014 [place], [place].

## 2013-08-24 NOTE — ED Notes (Signed)
Patient complaining of burning with urination since yesterday.

## 2013-08-26 LAB — URINE CULTURE

## 2013-08-27 ENCOUNTER — Telehealth (HOSPITAL_COMMUNITY): Payer: Self-pay | Admitting: Emergency Medicine

## 2013-08-27 NOTE — ED Notes (Signed)
Post ED Visit - Positive Culture Follow-up  Culture report reviewed by antimicrobial stewardship pharmacist: [x]  Wes Dulaney, Pharm.D., BCPS []  Celedonio MiyamotoJeremy Frens, Pharm.D., BCPS []  Georgina PillionElizabeth Martin, Pharm.D., BCPS []  EvansvilleMinh Pham, 17[address] Rainbow BoulevardPharm.D., BCPS, AAHIVP []  Estella HuskMichelle Turner, Pharm.D., BCPS, AAHIVP  Positive urine culture Treated with Keflex, organism sensitive to the same and no further patient follow-up is required at this time.  Zeb ComfortHolland, Layonna Dobie 08/27/2013, 6:17 PM

## 2013-08-29 NOTE — ED Provider Notes (Signed)
History/physical exam/procedure(s) were performed by non-physician practitioner and as supervising physician I was immediately available for consultation/collaboration. I have reviewed all notes and am in agreement with care and plan.   Endre Coutts S Ra Pfiester, MD 08/29/13 1505 

## 2013-08-31 DIAGNOSIS — E669 Obesity, unspecified: Secondary | ICD-10-CM | POA: Insufficient documentation

## 2013-08-31 DIAGNOSIS — Z792 Long term (current) use of antibiotics: Secondary | ICD-10-CM | POA: Insufficient documentation

## 2013-08-31 DIAGNOSIS — F17200 Nicotine dependence, unspecified, uncomplicated: Secondary | ICD-10-CM | POA: Insufficient documentation

## 2013-08-31 DIAGNOSIS — Z3202 Encounter for pregnancy test, result negative: Secondary | ICD-10-CM | POA: Insufficient documentation

## 2013-08-31 DIAGNOSIS — Z87440 Personal history of urinary (tract) infections: Secondary | ICD-10-CM | POA: Insufficient documentation

## 2013-08-31 DIAGNOSIS — Z765 Malingerer [conscious simulation]: Secondary | ICD-10-CM | POA: Insufficient documentation

## 2013-08-31 DIAGNOSIS — Z9089 Acquired absence of other organs: Secondary | ICD-10-CM | POA: Insufficient documentation

## 2013-08-31 DIAGNOSIS — N949 Unspecified condition associated with female genital organs and menstrual cycle: Secondary | ICD-10-CM | POA: Insufficient documentation

## 2013-08-31 DIAGNOSIS — N898 Other specified noninflammatory disorders of vagina: Secondary | ICD-10-CM | POA: Insufficient documentation

## 2013-08-31 DIAGNOSIS — Z8659 Personal history of other mental and behavioral disorders: Secondary | ICD-10-CM | POA: Insufficient documentation

## 2013-09-01 ENCOUNTER — Other Ambulatory Visit (HOSPITAL_COMMUNITY): Payer: Self-pay | Admitting: Emergency Medicine

## 2013-09-01 ENCOUNTER — Emergency Department (HOSPITAL_COMMUNITY)
Admission: EM | Admit: 2013-09-01 | Discharge: 2013-09-01 | Disposition: A | Discharge status: Home / Self Care | Payer: Medicaid Other | Attending: Emergency Medicine | Admitting: Emergency Medicine

## 2013-09-01 ENCOUNTER — Ambulatory Visit (HOSPITAL_COMMUNITY): Admit: 2013-09-01 | Payer: No Typology Code available for payment source

## 2013-09-01 ENCOUNTER — Ambulatory Visit (HOSPITAL_COMMUNITY): Payer: Medicaid Other

## 2013-09-01 ENCOUNTER — Encounter (HOSPITAL_COMMUNITY): Payer: Self-pay | Admitting: Emergency Medicine

## 2013-09-01 DIAGNOSIS — R102 Pelvic and perineal pain: Secondary | ICD-10-CM

## 2013-09-01 DIAGNOSIS — Z765 Malingerer [conscious simulation]: Secondary | ICD-10-CM

## 2013-09-01 LAB — BASIC METABOLIC PANEL
BUN: 9 mg/dL (ref 6–23)
CALCIUM: 9.7 mg/dL (ref 8.4–10.5)
CO2: 23 mEq/L (ref 19–32)
Chloride: 101 mEq/L (ref 96–112)
Creatinine, Ser: 0.82 mg/dL (ref 0.50–1.10)
GFR calc non Af Amer: >90 mL/min (ref >90)
Glucose, Bld: 93 mg/dL (ref 70–99)
POTASSIUM: 3.3 meq/L — AB (ref 3.7–5.3)
SODIUM: 137 meq/L (ref 137–147)

## 2013-09-01 LAB — WET PREP, GENITAL
Clue Cells Wet Prep HPF POC: NONE SEEN
TRICH WET PREP: NONE SEEN
WBC, Wet Prep HPF POC: NONE SEEN
Yeast Wet Prep HPF POC: NONE SEEN

## 2013-09-01 LAB — CBC WITH DIFFERENTIAL/PLATELET
BASOS PCT: 0 % (ref 0–1)
Basophils Absolute: 0 10*3/uL (ref 0.0–0.1)
EOS PCT: 2 % (ref 0–5)
Eosinophils Absolute: 0.3 10*3/uL (ref 0.0–0.7)
HCT: 42.1 % (ref 36.0–46.0)
Hemoglobin: 14.9 g/dL (ref 12.0–15.0)
Lymphocytes Relative: 32 % (ref 12–46)
Lymphs Abs: 4 10*3/uL (ref 0.7–4.0)
MCH: 33.3 pg (ref 26.0–34.0)
MCHC: 35.4 g/dL (ref 30.0–36.0)
MCV: 94 fL (ref 78.0–100.0)
Monocytes Absolute: 0.7 10*3/uL (ref 0.1–1.0)
Monocytes Relative: 6 % (ref 3–12)
NEUTROS PCT: 60 % (ref 43–77)
Neutro Abs: 7.6 10*3/uL (ref 1.7–7.7)
PLATELETS: 279 10*3/uL (ref 150–400)
RBC: 4.48 MIL/uL (ref 3.87–5.11)
RDW: 12.3 % (ref 11.5–15.5)
WBC: 12.6 10*3/uL — ABNORMAL HIGH (ref 4.0–10.5)

## 2013-09-01 LAB — RAPID URINE DRUG SCREEN, HOSP PERFORMED
Amphetamines: NOT DETECTED
BENZODIAZEPINES: POSITIVE — AB
Barbiturates: NOT DETECTED
Cocaine: NOT DETECTED
Opiates: NOT DETECTED
Tetrahydrocannabinol: POSITIVE — AB

## 2013-09-01 LAB — ETHANOL: Alcohol, Ethyl (B): <11 mg/dL (ref 0–11)

## 2013-09-01 LAB — HIV ANTIBODY (ROUTINE TESTING W REFLEX): HIV: NONREACTIVE

## 2013-09-01 LAB — RPR: RPR Ser Ql: NONREACTIVE

## 2013-09-01 LAB — PREGNANCY, URINE: Preg Test, Ur: NEGATIVE

## 2013-09-01 MED ORDER — ONDANSETRON HCL 4 MG/2ML IJ SOLN: 4.0000 mg | Freq: Once | INTRAMUSCULAR | Status: DC

## 2013-09-01 MED ORDER — ONDANSETRON HCL 4 MG/2ML IJ SOLN
4.0000 mg | Freq: Once | INTRAMUSCULAR | Status: AC
  Administered 2013-09-01: 4 mg via INTRAVENOUS

## 2013-09-01 MED ORDER — HYDROMORPHONE HCL PF 1 MG/ML IJ SOLN
1.0000 mg | Freq: Once | INTRAMUSCULAR | Status: AC
  Administered 2013-09-01: 1 mg via INTRAVENOUS
  Filled 2013-09-01: qty 1

## 2013-09-01 MED ORDER — SODIUM CHLORIDE 0.9 % IV SOLN: INTRAVENOUS | Status: DC

## 2013-09-01 MED ORDER — ONDANSETRON HCL 4 MG/2ML IJ SOLN
INTRAMUSCULAR | Status: AC
  Filled 2013-09-01: qty 2

## 2013-09-01 MED ORDER — SODIUM CHLORIDE 0.9 % IV BOLUS (SEPSIS)
2000.0000 mL | Freq: Once | INTRAVENOUS | Status: AC
  Administered 2013-09-01: 2000 mL via INTRAVENOUS

## 2013-09-01 NOTE — ED Notes (Signed)
Pelvic cart set up 

## 2013-09-01 NOTE — Discharge Instructions (Signed)
Use ibuprofen 400 mg 3 times a day, for pain. Return for the Ultrasound of the Pelvis as scheduled.    Pelvic Pain, Female Female pelvic pain can be caused by many different things and start from a variety of places. Pelvic pain refers to pain that is located in the lower half of the abdomen and between your hips. The pain may occur over a short period of time (acute) or may be reoccurring (chronic). The cause of pelvic pain may be related to disorders affecting the female reproductive organs (gynecologic), but it may also be related to the bladder, kidney stones, an intestinal complication, or muscle or skeletal problems. Getting help right away for pelvic pain is important, especially if there has been severe, sharp, or a sudden onset of unusual pain. It is also important to get help right away because some types of pelvic pain can be life threatening.  CAUSES  Below are only some of the causes of pelvic pain. The causes of pelvic pain can be in one of several categories.   Gynecologic.  Pelvic inflammatory disease.  Sexually transmitted infection.  Ovarian cyst or a twisted ovarian ligament (ovarian torsion).  Uterine lining that grows outside the uterus (endometriosis).  Fibroids, cysts, or tumors.  Ovulation.  Pregnancy.  Pregnancy that occurs outside the uterus (ectopic pregnancy).  Miscarriage.  Labor.  Abruption of the placenta or ruptured uterus.  Infection.  Uterine infection (endometritis).  Bladder infection.  Diverticulitis.  Miscarriage related to a uterine infection (septic abortion).  Bladder.  Inflammation of the bladder (cystitis).  Kidney stone(s).  Gastrointenstinal.  Constipation.  Diverticulitis.  Neurologic.  Trauma.  Feeling pelvic pain because of mental or emotional causes (psychosomatic).  Cancers of the bowel or pelvis. EVALUATION  Your caregiver will want to take a careful history of your concerns. This includes recent  changes in your health, a careful gynecologic history of your periods (menses), and a sexual history. Obtaining your family history and medical history is also important. Your caregiver may suggest a pelvic exam. A pelvic exam will help identify the location and severity of the pain. It also helps in the evaluation of which organ system may be involved. In order to identify the cause of the pelvic pain and be properly treated, your caregiver may order tests. These tests may include:   A pregnancy test.  Pelvic ultrasonography.  An X-ray exam of the abdomen.  A urinalysis or evaluation of vaginal discharge.  Blood tests. HOME CARE INSTRUCTIONS   Only take over-the-counter or prescription medicines for pain, discomfort, or fever as directed by your caregiver.   Rest as directed by your caregiver.   Eat a balanced diet.   Drink enough fluids to make your urine clear or pale yellow, or as directed.   Avoid sexual intercourse if it causes pain.   Apply warm or cold compresses to the lower abdomen depending on which one helps the pain.   Avoid stressful situations.   Keep a journal of your pelvic pain. Write down when it started, where the pain is located, and if there are things that seem to be associated with the pain, such as food or your menstrual cycle.  Follow up with your caregiver as directed.  SEEK MEDICAL CARE IF:  Your medicine does not help your pain.  You have abnormal vaginal discharge. SEEK IMMEDIATE MEDICAL CARE IF:   You have heavy bleeding from the vagina.   Your pelvic pain increases.   You feel lightheaded or faint.  You have chills.   You have pain with urination or blood in your urine.   You have uncontrolled diarrhea or vomiting.   You have a fever or persistent symptoms for more than 3 days.  You have a fever and your symptoms suddenly get worse.   You are being physically or sexually abused.  MAKE SURE YOU:  Understand these  instructions.  Will watch your condition.  Will get help if you are not doing well or get worse. Document Released: 07/07/2004 Document Revised: 02/09/2012 Document Reviewed: 11/30/2011 [facility] Patient Information 2014 [place], [place].

## 2013-09-01 NOTE — ED Notes (Signed)
Pt states she thinks she has an ovarian cyst to rupture, pt having lower abdominal pain. Vaginal bleeding, soaked 5 pads today.

## 2013-09-01 NOTE — ED Provider Notes (Signed)
CSN: [id number]     Arrival date & time 08/31/13  2358 History   First MD Initiated Contact with Patient 09/01/13 0005     Chief Complaint  Patient presents with  . Abdominal Pain  . Vaginal Bleeding   (Consider location/radiation/quality/duration/timing/severity/associated sxs/prior Treatment) Patient is a 28 y.o. female presenting with abdominal pain and vaginal bleeding. The history is provided by the patient.  Abdominal Pain Associated symptoms: vaginal bleeding   Vaginal Bleeding Associated symptoms: abdominal pain   Angelyn L Chrobak is a 28 y.o. female who presents for evaluation of severe lower abdominal pain. That started today. She states it feels like her ovarian cyst pain. She called her doctor, but could not get an appointment to see him. She has had some very mild spotting when wiping, after urinating. She denies dysuria, urinary frequency, or diarrhea. She has had 10 episodes of vomiting, her emesis is clear, today. She has had a yellow vaginal discharge. She has frequent emergency department visits requiring treatment for painful conditions with narcotic analgesia. She has no known sick contacts. She was recently treated in this emergency department for urinary tract infection, and she states that those symptoms have resolved. Her last Depo-Provera shot was one month ago. There are no other known modifying factors.  Past Medical History  Diagnosis Date  . Ovarian cyst   . Anxiety   . Bradycardia     pt says was seen at [facility] for bradycardia but was never put on any medication for it.  . Renal disorder    Past Surgical History  Procedure Laterality Date  . Cesarean section      x2  . Cholecystectomy N/A 03/03/2013    Procedure: LAPAROSCOPIC CHOLECYSTECTOMY;  Surgeon: Dalia Heading, MD;  Location: [facility];  Service: General;  Laterality: N/A;  . Cholecystectomy  2014   No family history on file. History  Substance Use Topics  . Smoking status: Current Every Day Smoker --  0.50 packs/day for 3 years    Types: Cigarettes  . Smokeless tobacco: Not on file  . Alcohol Use: No   OB History   Grav Para Term Preterm Abortions TAB SAB Ect Mult Living   2 2 2       2      Review of Systems  Gastrointestinal: Positive for abdominal pain.  Genitourinary: Positive for vaginal bleeding.  All other systems reviewed and are negative.    Allergies  Tramadol; Motrin; and Naprosyn  Home Medications   Current Outpatient Rx  Name  Route  Sig  Dispense  Refill  . medroxyPROGESTERone (DEPO-PROVERA) 150 MG/ML injection   Intramuscular   Inject 150 mg into the muscle every 3 (three) months.         Marland Kitchen acetaminophen (TYLENOL) 500 MG tablet   Oral   Take 1,000 mg by mouth every 6 (six) hours as needed for pain.         . cephALEXin (KEFLEX) 500 MG capsule   Oral   Take 1 capsule (500 mg total) by mouth 4 (four) times daily.   20 capsule   0   . ibuprofen (ADVIL,MOTRIN) 600 MG tablet   Oral   Take 1 tablet (600 mg total) by mouth every 6 (six) hours as needed.   30 tablet   0   . phenazopyridine (PYRIDIUM) 95 MG tablet   Oral   Take 1 tablet (95 mg total) by mouth 3 (three) times daily as needed for pain.   10 tablet  0    BP 108/58  Pulse 106  Temp(Src) 97.8 F (36.6 C) (Oral)  Ht 5\' 6"  (1.676 m)  Wt 220 lb (99.791 kg)  BMI 35.53 kg/m2  SpO2 96% Physical Exam  Nursing note and vitals reviewed. Constitutional: She is oriented to person, place, and time. She appears well-developed. She appears distressed (she is trembling, secondary to her pain).  Obese  HENT:  Head: Normocephalic and atraumatic.  Eyes: Conjunctivae and EOM are normal. Pupils are equal, round, and reactive to light.  Neck: Normal range of motion and phonation normal. Neck supple.  Cardiovascular: Normal rate, regular rhythm and intact distal pulses.   Pulmonary/Chest: Effort normal and breath sounds normal. She exhibits no tenderness.  Abdominal: Soft. She exhibits no  distension. There is no tenderness. There is no guarding.  Genitourinary:  Normal external female genitalia. No vaginal discharge. No vaginal bleeding. Cervix is closed. There is a slight abnormality of the cervical os, which is possibly consistent with polyp versus ectropion of the endocervix. This area appears somewhat friable, but is not currently bleeding. On bimanual examination, I was unable to palpate the ovaries or the uterus secondary to her obesity. She did have mild, diffuse pelvic tenderness, on examination.  Musculoskeletal: Normal range of motion.  Neurological: She is alert and oriented to person, place, and time. She exhibits normal muscle tone.  Skin: Skin is warm and dry.  Psychiatric: Her behavior is normal. Judgment and thought content normal.  She is anxious    ED Course  Procedures (including critical care time)  Medications  0.9 %  sodium chloride infusion (not administered)  sodium chloride 0.9 % bolus 2,000 mL (2,000 mLs Intravenous New Bag/Given 09/01/13 0034)  ondansetron (ZOFRAN) injection 4 mg (4 mg Intravenous Given 09/01/13 0043)  HYDROmorphone (DILAUDID) injection 1 mg (1 mg Intravenous Given 09/01/13 0116)    Patient Vitals for the past 24 hrs:  BP Temp Temp src Pulse SpO2 Height Weight  09/01/13 0009 108/58 mmHg 97.8 F (36.6 C) Oral 106 96 % 5\' 6"  (1.676 m) 220 lb (99.791 kg)    12:50 AM Reevaluation with update and discussion. After initial assessment and treatment, an updated evaluation reveals pelvic examination is completed, and she is feeling better. Jovi Alvizo L   2:08 AM Reevaluation with update and discussion. After initial assessment and treatment, an updated evaluation reveals she is requesting more IV analgesia. I explained to her that I did not think she needed more narcotics at this time. Kenzlie Disch L    Review of [place] , controlled substances, reporting databank; indicates that she has received numerous narcotic prescriptions  from multiple different providers, all within the last several months. There is a strong indication of narcotics diversion behavior.   Labs Review Labs Reviewed  CBC WITH DIFFERENTIAL - Abnormal; Notable for the following:    WBC 12.6 (*)    All other components within normal limits  BASIC METABOLIC PANEL - Abnormal; Notable for the following:    Potassium 3.3 (*)    All other components within normal limits  URINE RAPID DRUG SCREEN (HOSP PERFORMED) - Abnormal; Notable for the following:    Benzodiazepines POSITIVE (*)    Tetrahydrocannabinol POSITIVE (*)    All other components within normal limits  WET PREP, GENITAL  GC/CHLAMYDIA PROBE AMP  ETHANOL  PREGNANCY, URINE  RPR  HIV ANTIBODY (ROUTINE TESTING)   Imaging Review No results found.  EKG Interpretation   None       MDM  1. Pelvic pain   2. Drug-seeking behavior    Nonspecific pelvic pain, with a history of ovarian cysts. I doubt acute ovarian torsion, unilateral ovarian hypertrophy or PID.  Nursing Notes Reviewed/ Care Coordinated Applicable Imaging Reviewed Interpretation of Laboratory Data incorporated into ED treatment  The patient appears reasonably screened and/or stabilized for discharge and I doubt any other medical condition or other [facility] requiring further screening, evaluation, or treatment in the ED at this time prior to discharge.  Plan: Home Medications- Ibuprofen; Home Treatments- rest; return here if the recommended treatment, does not improve the symptoms; Recommended follow up- Return for OP Pelvic U/S, PCP prn    Flint Melter, MD 09/01/13 [phone]

## 2013-09-02 ENCOUNTER — Emergency Department (HOSPITAL_COMMUNITY)
Admission: EM | Admit: 2013-09-02 | Discharge: 2013-09-02 | Disposition: A | Discharge status: Home / Self Care | Payer: Medicaid Other | Attending: Emergency Medicine | Admitting: Emergency Medicine

## 2013-09-02 ENCOUNTER — Encounter (HOSPITAL_COMMUNITY): Payer: Self-pay | Admitting: Emergency Medicine

## 2013-09-02 ENCOUNTER — Emergency Department (HOSPITAL_COMMUNITY): Payer: Medicaid Other

## 2013-09-02 DIAGNOSIS — S298XXA Other specified injuries of thorax, initial encounter: Secondary | ICD-10-CM | POA: Insufficient documentation

## 2013-09-02 DIAGNOSIS — W19XXXA Unspecified fall, initial encounter: Secondary | ICD-10-CM

## 2013-09-02 DIAGNOSIS — F17200 Nicotine dependence, unspecified, uncomplicated: Secondary | ICD-10-CM | POA: Insufficient documentation

## 2013-09-02 DIAGNOSIS — S0993XA Unspecified injury of face, initial encounter: Secondary | ICD-10-CM | POA: Insufficient documentation

## 2013-09-02 DIAGNOSIS — Z8742 Personal history of other diseases of the female genital tract: Secondary | ICD-10-CM | POA: Insufficient documentation

## 2013-09-02 DIAGNOSIS — W208XXA Other cause of strike by thrown, projected or falling object, initial encounter: Secondary | ICD-10-CM | POA: Insufficient documentation

## 2013-09-02 DIAGNOSIS — Z8659 Personal history of other mental and behavioral disorders: Secondary | ICD-10-CM | POA: Insufficient documentation

## 2013-09-02 DIAGNOSIS — W1809XA Striking against other object with subsequent fall, initial encounter: Secondary | ICD-10-CM | POA: Insufficient documentation

## 2013-09-02 DIAGNOSIS — S199XXA Unspecified injury of neck, initial encounter: Secondary | ICD-10-CM

## 2013-09-02 DIAGNOSIS — IMO0002 Reserved for concepts with insufficient information to code with codable children: Secondary | ICD-10-CM | POA: Insufficient documentation

## 2013-09-02 DIAGNOSIS — Z87442 Personal history of urinary calculi: Secondary | ICD-10-CM | POA: Insufficient documentation

## 2013-09-02 DIAGNOSIS — Y92009 Unspecified place in unspecified non-institutional (private) residence as the place of occurrence of the external cause: Secondary | ICD-10-CM | POA: Insufficient documentation

## 2013-09-02 DIAGNOSIS — Z8679 Personal history of other diseases of the circulatory system: Secondary | ICD-10-CM | POA: Insufficient documentation

## 2013-09-02 DIAGNOSIS — Z792 Long term (current) use of antibiotics: Secondary | ICD-10-CM | POA: Insufficient documentation

## 2013-09-02 DIAGNOSIS — R0789 Other chest pain: Secondary | ICD-10-CM

## 2013-09-02 DIAGNOSIS — W108XXA Fall (on) (from) other stairs and steps, initial encounter: Secondary | ICD-10-CM | POA: Insufficient documentation

## 2013-09-02 DIAGNOSIS — Y9389 Activity, other specified: Secondary | ICD-10-CM | POA: Insufficient documentation

## 2013-09-02 LAB — GC/CHLAMYDIA PROBE AMP
CT PROBE, AMP APTIMA: NEGATIVE
GC PROBE AMP APTIMA: NEGATIVE

## 2013-09-02 MED ORDER — IOHEXOL 300 MG/ML  SOLN
100.0000 mL | Freq: Once | INTRAMUSCULAR | Status: AC | PRN
  Administered 2013-09-02: 100 mL via INTRAVENOUS

## 2013-09-02 MED ORDER — HYDROMORPHONE HCL PF 1 MG/ML IJ SOLN
1.0000 mg | Freq: Once | INTRAMUSCULAR | Status: AC
  Administered 2013-09-02: 1 mg via INTRAMUSCULAR
  Filled 2013-09-02: qty 1

## 2013-09-02 MED ORDER — OXYCODONE-ACETAMINOPHEN 5-325 MG PO TABS: 2.0000 | ORAL_TABLET | ORAL | Status: DC | PRN

## 2013-09-02 NOTE — ED Notes (Signed)
Pt was helping friend move washer yesterday, unclear on how the pt and washer fell down 15 steps. States "the washer hit my lower left ribs." Denis LOC.

## 2013-09-02 NOTE — ED Provider Notes (Signed)
CSN: [id number]     Arrival date & time 09/02/13  1315 History  This chart was scribed for Donnetta Hutching, MD by Bennett Scrape, ED Scribe. This patient was seen in room [facility]14[facility] and the patient's care was started at 1:44 PM.   Chief Complaint  Patient presents with  . Fall    The history is provided by the patient. No language interpreter was used.    HPI Comments: Valerie Robinson is a 28 y.o. female who presents to the Emergency Department complaining of a fall that occurred yesterday. Pt states that she has helping a friend move a washer yesterday up to a second floor apartment when her friend let go of the washer. She states that the washer hit her ribs and knocked her backwards down 15 steps with the washer landing on top of her. She c/o associated lower back pain that radiates into her bilateral lower shoulders and left lateral rib pain. She denies any LOC, HAs or neck pain.   Past Medical History  Diagnosis Date  . Ovarian cyst   . Anxiety   . Bradycardia     pt says was seen at [facility] for bradycardia but was never put on any medication for it.  . Renal disorder     kidney stone   Past Surgical History  Procedure Laterality Date  . Cesarean section      x2  . Cholecystectomy N/A 03/03/2013    Procedure: LAPAROSCOPIC CHOLECYSTECTOMY;  Surgeon: Dalia Heading, MD;  Location: [facility];  Service: General;  Laterality: N/A;  . Cholecystectomy  2014   History reviewed. No pertinent family history. History  Substance Use Topics  . Smoking status: Current Every Day Smoker -- 0.50 packs/day for 3 years    Types: Cigarettes  . Smokeless tobacco: Not on file  . Alcohol Use: No   OB History   Grav Para Term Preterm Abortions TAB SAB Ect Mult Living   2 2 2       2      Review of Systems  A complete 10 system review of systems was obtained and all systems are negative except as noted in the HPI and PMH.   Allergies  Motrin; Naprosyn; and Tramadol  Home Medications   Current  Outpatient Rx  Name  Route  Sig  Dispense  Refill  . acetaminophen (TYLENOL) 500 MG tablet   Oral   Take 1,000 mg by mouth every 6 (six) hours as needed for pain.         . medroxyPROGESTERone (DEPO-PROVERA) 150 MG/ML injection   Intramuscular   Inject 150 mg into the muscle every 3 (three) months.         . cephALEXin (KEFLEX) 500 MG capsule   Oral   Take 1 capsule (500 mg total) by mouth 4 (four) times daily.   20 capsule   0   . oxyCODONE-acetaminophen (PERCOCET) 5-325 MG per tablet   Oral   Take 2 tablets by mouth every 4 (four) hours as needed.   10 tablet   0   . phenazopyridine (PYRIDIUM) 95 MG tablet   Oral   Take 1 tablet (95 mg total) by mouth 3 (three) times daily as needed for pain.   10 tablet   0    Triage Vitals: BP 113/63  Pulse 90  Temp(Src) 97.6 F (36.4 C) (Oral)  Resp 18  Ht 5\' 6"  (1.676 m)  Wt 220 lb (99.791 kg)  BMI 35.53 kg/m2  SpO2  100%  Physical Exam  Nursing note and vitals reviewed. Constitutional: She is oriented to person, place, and time. She appears well-developed and well-nourished.  HENT:  Head: Normocephalic and atraumatic.  Eyes: Conjunctivae and EOM are normal. Pupils are equal, round, and reactive to light.  Neck: Normal range of motion. Neck supple.  Minimal cervical tenderness  Cardiovascular: Normal rate, regular rhythm and normal heart sounds.   Pulmonary/Chest: Effort normal and breath sounds normal. She exhibits tenderness (left inferior lateral rib area).  Abdominal: Soft. Bowel sounds are normal.  Musculoskeletal: Normal range of motion.  Minimal general back tenderness   Neurological: She is alert and oriented to person, place, and time.  Skin: Skin is warm and dry.  Psychiatric: She has a normal mood and affect. Her behavior is normal.    ED Course  Procedures (including critical care time)  DIAGNOSTIC STUDIES: Oxygen Saturation is 100% on RA, normal by my interpretation.    COORDINATION OF CARE: 1:47  PM-Discussed treatment plan which includes CT of abdomen and pelvis and pain management with pt at bedside and pt agreed to plan.    Labs Review Labs Reviewed - No data to display Imaging Review Ct Cervical Spine Wo Contrast  09/02/2013   CLINICAL DATA:  Fall  EXAM: CT CERVICAL SPINE WITHOUT CONTRAST  TECHNIQUE: Multidetector CT imaging of the cervical spine was performed without intravenous contrast. Multiplanar CT image reconstructions were also generated.  COMPARISON:  None.  FINDINGS: Normal cervical lordosis.  No evidence of fracture dislocation. Vertebral body heights and intervertebral disc spaces are maintained. Dens appears intact.  No prevertebral soft tissue swelling.  Visualized thyroid is unremarkable.  Visualized lung apices are clear.  IMPRESSION: Normal cervical spine CT.   Electronically Signed   By: Charline BillsSriyesh  Krishnan M.D.   On: 09/02/2013 14:59   Ct Abdomen Pelvis W Contrast  09/02/2013   CLINICAL DATA:  Trauma, pain  EXAM: CT ABDOMEN AND PELVIS WITH CONTRAST  TECHNIQUE: Multidetector CT imaging of the abdomen and pelvis was performed using the standard protocol following bolus administration of intravenous contrast.  CONTRAST:  100 cc Omnipaque 300  COMPARISON:  06/08/2013  FINDINGS: Minor dependent basilar atelectasis. No lower lobe pneumonia. Normal heart size. No pericardial or pleural effusion. No lower chest soft tissue asymmetry or focal abnormality.  Abdomen: Prior cholecystectomy evident. Stable mild biliary prominence, suspect post cholecystectomy effect. Liver, biliary system, pancreas, spleen, adrenal glands, and kidneys are within normal limits for age and demonstrate no acute process or injury. Negative for bowel obstruction, dilatation, ileus, or free air.  No abdominal free fluid, fluid collection, hemorrhage, abscess, or adenopathy.  No acute mesenteric abnormality or area of bowel wall thickening. Stable mildly prominent appendix.  Pelvis: No pelvic free fluid, fluid  collection, hemorrhage, abscess, adenopathy, inguinal abnormality, or hernia. Urinary bladder unremarkable. Uterus and adnexal normal in size. No acute distal bowel process.  Minor degenerative changes of the SI joints. No acute osseous finding or abnormality.  IMPRESSION: Prior cholecystectomy.  Stable mild biliary dilatation suspect post cholecystectomy affect.  No acute intra-abdominal or pelvic finding or injury.   Electronically Signed   By: Ruel Favorsrevor  Shick M.D.   On: 09/02/2013 15:10    EKG Interpretation   None       MDM   1. Fall, initial encounter   2. Left-sided chest wall pain    Patient is hemodynamically stable.  CT abdomen pelvis negative. CT cervical spine negative. Discharge medicines Percocet #10  I personally performed the  services described in this documentation, which was scribed in my presence. The recorded information has been reviewed and is accurate.    Donnetta Hutching, MD 09/02/13 [phone]

## 2013-09-02 NOTE — Discharge Instructions (Signed)
X-rays were negative. Prescription for pain medicine. You will be sore for several days.

## 2013-09-06 ENCOUNTER — Encounter (HOSPITAL_COMMUNITY): Payer: Self-pay | Admitting: Emergency Medicine

## 2013-09-06 ENCOUNTER — Emergency Department (HOSPITAL_COMMUNITY)
Admission: EM | Admit: 2013-09-06 | Discharge: 2013-09-06 | Disposition: A | Discharge status: Home / Self Care | Payer: Medicaid Other | Attending: Emergency Medicine | Admitting: Emergency Medicine

## 2013-09-06 DIAGNOSIS — Z79899 Other long term (current) drug therapy: Secondary | ICD-10-CM | POA: Insufficient documentation

## 2013-09-06 DIAGNOSIS — Z87442 Personal history of urinary calculi: Secondary | ICD-10-CM | POA: Insufficient documentation

## 2013-09-06 DIAGNOSIS — Z8679 Personal history of other diseases of the circulatory system: Secondary | ICD-10-CM | POA: Insufficient documentation

## 2013-09-06 DIAGNOSIS — Z792 Long term (current) use of antibiotics: Secondary | ICD-10-CM | POA: Insufficient documentation

## 2013-09-06 DIAGNOSIS — Z8659 Personal history of other mental and behavioral disorders: Secondary | ICD-10-CM | POA: Insufficient documentation

## 2013-09-06 DIAGNOSIS — H00019 Hordeolum externum unspecified eye, unspecified eyelid: Secondary | ICD-10-CM | POA: Insufficient documentation

## 2013-09-06 DIAGNOSIS — Z8742 Personal history of other diseases of the female genital tract: Secondary | ICD-10-CM | POA: Insufficient documentation

## 2013-09-06 MED ORDER — KETOROLAC TROMETHAMINE 0.5 % OP SOLN
1.0000 [drp] | Freq: Once | OPHTHALMIC | Status: AC
Start: 2013-09-06 — End: 2013-09-06
  Administered 2013-09-06: 1 [drp] via OPHTHALMIC
  Filled 2013-09-06: qty 5

## 2013-09-06 MED ORDER — TOBRAMYCIN 0.3 % OP SOLN
1.0000 [drp] | Freq: Once | OPHTHALMIC | Status: AC
  Administered 2013-09-06: 1 [drp] via OPHTHALMIC
  Filled 2013-09-06: qty 5

## 2013-09-06 MED ORDER — TETRACAINE HCL 0.5 % OP SOLN
OPHTHALMIC | Status: AC
  Administered 2013-09-06: 20:00:00
  Filled 2013-09-06: qty 2

## 2013-09-06 NOTE — ED Notes (Signed)
Eye acuity done in Triage Left eye:  20/25, Right eye: 20/20.

## 2013-09-06 NOTE — ED Notes (Signed)
Pt c/o left eye pain since last night. Pt keeping eye closed due to pain. Top lid slightly swollen but no redness to sclera noted. Nad.

## 2013-09-06 NOTE — Discharge Instructions (Signed)
Sty A sty (hordeolum) is an infection of a gland in the eyelid located at the base of the eyelash. A sty may develop a white or yellow head of pus. It can be puffy (swollen). Usually, the sty will burst and pus will come out on its own. They do not leave lumps in the eyelid once they drain. A sty is often confused with another form of cyst of the eyelid called a chalazion. Chalazions occur within the eyelid and not on the edge where the bases of the eyelashes are. They often are red, sore and then form firm lumps in the eyelid. CAUSES   Germs (bacteria).  Lasting (chronic) eyelid inflammation. SYMPTOMS   Tenderness, redness and swelling along the edge of the eyelid at the base of the eyelashes.  Sometimes, there is a white or yellow head of pus. It may or may not drain. DIAGNOSIS  An ophthalmologist will be able to distinguish between a sty and a chalazion and treat the condition appropriately.  TREATMENT   Styes are typically treated with warm packs (compresses) until drainage occurs.  In rare cases, medicines that kill germs (antibiotics) may be prescribed. These antibiotics may be in the form of drops, cream or pills.  If a hard lump has formed, it is generally necessary to do a small incision and remove the hardened contents of the cyst in a minor surgical procedure done in the office.  In suspicious cases, your caregiver may send the contents of the cyst to the lab to be certain that it is not a rare, but dangerous form of cancer of the glands of the eyelid. HOME CARE INSTRUCTIONS   Wash your hands often and dry them with a clean towel. Avoid touching your eyelid. This may spread the infection to other parts of the eye.  Apply heat to your eyelid for 10 to 20 minutes, several times a day, to ease pain and help to heal it faster.  Do not squeeze the sty. Allow it to drain on its own. Wash your eyelid carefully 3 to 4 times per day to remove any pus. SEEK IMMEDIATE MEDICAL CARE IF:    Your eye becomes painful or puffy (swollen).  Your vision changes.  Your sty does not drain by itself within 3 days.  Your sty comes back within a short period of time, even with treatment.  You have redness (inflammation) around the eye.  You have a fever. Document Released: 05/20/2005 Document Revised: 11/02/2011 Document Reviewed: 01/22/2009 [employer] Patient Information 2014 [employer].  

## 2013-09-08 NOTE — ED Provider Notes (Signed)
CSN: [id number]     Arrival date & time 09/06/13  1847 History   First MD Initiated Contact with Patient 09/06/13 1924     Chief Complaint  Patient presents with  . Eye Pain   (Consider location/radiation/quality/duration/timing/severity/associated sxs/prior Treatment) Patient is a 28 y.o. female presenting with eye pain. The history is provided by the patient.  Eye Pain This is a new problem. The current episode started yesterday. The problem occurs constantly. The problem has been unchanged. Pertinent negatives include no congestion, fever, headaches, nausea, neck pain, numbness, rash, sore throat, vertigo, visual change, vomiting or weakness. Exacerbated by: blinking. She has tried nothing for the symptoms. The treatment provided no relief.   No hx of eye trauma, fever, chills, visual change, or contact use.  Pt also does not wear glasses   Past Medical History  Diagnosis Date  . Ovarian cyst   . Anxiety   . Bradycardia     pt says [facility] but was never put on any medication for it.  . Renal disorder     kidney stone   Past Surgical History  Procedure Laterality Date  . Cesarean section      x2  . Cholecystectomy N/A 03/03/2013    Procedure: LAPAROSCOPIC CHOLECYSTECTOMY;  Surgeon: Dalia HeadingMark A Jenkins, MD;  Location: [facility];  Service: General;  Laterality: N/A;  . Cholecystectomy  2014   History reviewed. No pertinent family history. History  Substance Use Topics  . Smoking status: Current Every Day Smoker -- 0.50 packs/day for 3 years    Types: Cigarettes  . Smokeless tobacco: Not on file  . Alcohol Use: No   OB History   Grav Para Term Preterm Abortions TAB SAB Ect Mult Living   2 2 2       2      Review of Systems  Constitutional: Negative for fever.  HENT: Negative for congestion, sore throat and trouble swallowing.   Eyes: Positive for pain. Negative for photophobia, discharge, redness, itching and visual disturbance.  Gastrointestinal:  Negative for nausea and vomiting.  Musculoskeletal: Negative for neck pain.  Skin: Negative for rash.  Neurological: Negative for vertigo, weakness, numbness and headaches.  All other systems reviewed and are negative.    Allergies  Motrin; Naprosyn; and Tramadol  Home Medications   Current Outpatient Rx  Name  Route  Sig  Dispense  Refill  . acetaminophen (TYLENOL) 500 MG tablet   Oral   Take 1,000 mg by mouth every 6 (six) hours as needed for pain.         . cephALEXin (KEFLEX) 500 MG capsule   Oral   Take 1 capsule (500 mg total) by mouth 4 (four) times daily.   20 capsule   0   . medroxyPROGESTERone (DEPO-PROVERA) 150 MG/ML injection   Intramuscular   Inject 150 mg into the muscle every 3 (three) months.          BP 108/80  Pulse 87  Temp(Src) 98.7 F (37.1 C) (Core (Comment))  Resp 24  Ht 5\' 6"  (1.676 m)  Wt 225 lb (102.059 kg)  BMI 36.33 kg/m2  SpO2 97% Physical Exam  Nursing note and vitals reviewed. Constitutional: She is oriented to person, place, and time. She appears well-developed and well-nourished. No distress.  HENT:  Head: Normocephalic and atraumatic.  Right Ear: External ear normal.  Left Ear: External ear normal.  Mouth/Throat: Oropharynx is clear and moist. No oropharyngeal exudate.  Eyes: EOM are  normal. Pupils are equal, round, and reactive to light. Lids are everted and swept, no foreign bodies found. Right eye exhibits no discharge. Left eye exhibits hordeolum. Left eye exhibits no discharge. Right conjunctiva is not injected. Right conjunctiva has no hemorrhage. Left conjunctiva is not injected. Left conjunctiva has no hemorrhage.  Fundoscopic exam:      The left eye shows no exudate and no papilledema.  Slit lamp exam:      The left eye shows no corneal abrasion, no corneal flare, no foreign body, no hyphema and no fluorescein uptake.    Neck: Normal range of motion. Neck supple.  Cardiovascular: Normal rate, regular rhythm and  normal heart sounds.   Pulmonary/Chest: Effort normal and breath sounds normal. No respiratory distress.  Lymphadenopathy:    She has no cervical adenopathy.  Neurological: She is alert and oriented to person, place, and time.  Skin: Skin is warm and dry.    ED Course  Procedures (including critical care time) Labs Review Labs Reviewed - No data to display Imaging Review No results found.  EKG Interpretation   None       MDM   1. Hordeolum     Eye acuity done in Triage Left eye: 20/25, Right eye: 20/20.   Patient agrees to tobramycin, warm compresses and f/u with Dr. Lita Mains if needed.     Jarrett Albor L. Faust Thorington, PA-C 09/08/13 1344

## 2013-09-09 NOTE — ED Provider Notes (Signed)
Medical screening examination/treatment/procedure(s) were performed by non-physician practitioner and as supervising physician I was immediately available for consultation/collaboration.  EKG Interpretation   None        Cariann Kinnamon, MD 09/09/13 1547 

## 2013-09-18 ENCOUNTER — Emergency Department (HOSPITAL_COMMUNITY): Payer: Medicaid Other

## 2013-09-18 ENCOUNTER — Emergency Department (HOSPITAL_COMMUNITY)
Admission: EM | Admit: 2013-09-18 | Discharge: 2013-09-18 | Disposition: A | Discharge status: Home / Self Care | Payer: Medicaid Other | Attending: Emergency Medicine | Admitting: Emergency Medicine

## 2013-09-18 ENCOUNTER — Encounter (HOSPITAL_COMMUNITY): Payer: Self-pay | Admitting: Emergency Medicine

## 2013-09-18 DIAGNOSIS — Z8742 Personal history of other diseases of the female genital tract: Secondary | ICD-10-CM | POA: Insufficient documentation

## 2013-09-18 DIAGNOSIS — F17200 Nicotine dependence, unspecified, uncomplicated: Secondary | ICD-10-CM | POA: Insufficient documentation

## 2013-09-18 DIAGNOSIS — Z8659 Personal history of other mental and behavioral disorders: Secondary | ICD-10-CM | POA: Insufficient documentation

## 2013-09-18 DIAGNOSIS — Z792 Long term (current) use of antibiotics: Secondary | ICD-10-CM | POA: Insufficient documentation

## 2013-09-18 DIAGNOSIS — Z87442 Personal history of urinary calculi: Secondary | ICD-10-CM | POA: Insufficient documentation

## 2013-09-18 DIAGNOSIS — M25569 Pain in unspecified knee: Secondary | ICD-10-CM | POA: Insufficient documentation

## 2013-09-18 DIAGNOSIS — Z8679 Personal history of other diseases of the circulatory system: Secondary | ICD-10-CM | POA: Insufficient documentation

## 2013-09-18 MED ORDER — ACETAMINOPHEN 500 MG PO TABS
1000.0000 mg | ORAL_TABLET | Freq: Once | ORAL | Status: AC
  Administered 2013-09-18: 1000 mg via ORAL
  Filled 2013-09-18: qty 2

## 2013-09-18 NOTE — ED Notes (Signed)
C/o rt knee pain that radiates to ankle. Denies injury. Ambulated to room without difficulty.

## 2013-09-18 NOTE — Discharge Instructions (Signed)
Continue heat/ice to your knee. Take acetaminophen 1000 mg every 6 hrs for pain. If you continue to have pain you can be rechecked by the orthopedist on call, Dr Hilda Lias. Call his office to get an appointment.     Cryotherapy Cryotherapy means treatment with cold. Ice or gel packs can be used to reduce both pain and swelling. Ice is the most helpful within the first 24 to 48 hours after an injury or flareup from overusing a muscle or joint. Sprains, strains, spasms, burning pain, shooting pain, and aches can all be eased with ice. Ice can also be used when recovering from surgery. Ice is effective, has very few side effects, and is safe for most people to use. PRECAUTIONS  Ice is not a safe treatment option for people with:  Raynaud's phenomenon. This is a condition affecting small blood vessels in the extremities. Exposure to cold may cause your problems to return.  Cold hypersensitivity. There are many forms of cold hypersensitivity, including:  Cold urticaria. Red, itchy hives appear on the skin when the tissues begin to warm after being iced.  Cold erythema. This is a red, itchy rash caused by exposure to cold.  Cold hemoglobinuria. Red blood cells break down when the tissues begin to warm after being iced. The hemoglobin that carry oxygen are passed into the urine because they cannot combine with blood proteins fast enough.  Numbness or altered sensitivity in the area being iced. If you have any of the following conditions, do not use ice until you have discussed cryotherapy with your caregiver:  Heart conditions, such as arrhythmia, angina, or chronic heart disease.  High blood pressure.  Healing wounds or open skin in the area being iced.  Current infections.  Rheumatoid arthritis.  Poor circulation.  Diabetes. Ice slows the blood flow in the region it is applied. This is beneficial when trying to stop inflamed tissues from spreading irritating chemicals to surrounding  tissues. However, if you expose your skin to cold temperatures for too long or without the proper protection, you can damage your skin or nerves. Watch for signs of skin damage due to cold. HOME CARE INSTRUCTIONS Follow these tips to use ice and cold packs safely.  Place a dry or damp towel between the ice and skin. A damp towel will cool the skin more quickly, so you may need to shorten the time that the ice is used.  For a more rapid response, add gentle compression to the ice.  Ice for no more than 10 to 20 minutes at a time. The bonier the area you are icing, the less time it will take to get the benefits of ice.  Check your skin after 5 minutes to make sure there are no signs of a poor response to cold or skin damage.  Rest 20 minutes or more in between uses.  Once your skin is numb, you can end your treatment. You can test numbness by very lightly touching your skin. The touch should be so light that you do not see the skin dimple from the pressure of your fingertip. When using ice, most people will feel these normal sensations in this order: cold, burning, aching, and numbness.  Do not use ice on someone who cannot communicate their responses to pain, such as small children or people with dementia. HOW TO MAKE AN ICE PACK Ice packs are the most common way to use ice therapy. Other methods include ice massage, ice baths, and cryo-sprays. Muscle creams that  cause a cold, tingly feeling do not offer the same benefits that ice offers and should not be used as a substitute unless recommended by your caregiver. To make an ice pack, do one of the following:  Place crushed ice or a bag of frozen vegetables in a sealable plastic bag. Squeeze out the excess air. Place this bag inside another plastic bag. Slide the bag into a pillowcase or place a damp towel between your skin and the bag.  Mix 3 parts water with 1 part rubbing alcohol. Freeze the mixture in a sealable plastic bag. When you remove  the mixture from the freezer, it will be slushy. Squeeze out the excess air. Place this bag inside another plastic bag. Slide the bag into a pillowcase or place a damp towel between your skin and the bag. SEEK MEDICAL CARE IF:  You develop white spots on your skin. This may give the skin a blotchy (mottled) appearance.  Your skin turns blue or pale.  Your skin becomes waxy or hard.  Your swelling gets worse. MAKE SURE YOU:   Understand these instructions.  Will watch your condition.  Will get help right away if you are not doing well or get worse. Document Released: 04/06/2011 Document Revised: 11/02/2011 Document Reviewed: 04/06/2011 [facility] Patient Information 2014 [place], [place].  Heat Therapy Heat therapy can help ease achy, tense, stiff, and tight muscles and joints. Heat should not be used on new injuries. Wait at least 48 hours after the injury before using heat therapy. Heat also should not be used for discomfort or pain that occurs right after doing an activity. If you still have pain or stiffness 3 hours after finishing the activity, then heat therapy may be used. PRECAUTIONS  High heat or prolonged exposure to heat can cause burns. Be careful when using heat therapy to avoid burning your skin. If you have any of the following conditions, do not use heat until you have discussed heat therapy with your caregiver:  Poor circulation.  Healing wounds or scarred skin in the area being treated.  Diabetes, heart disease, or high blood pressure.  Numbness of the area being treated.  Unusual swelling of the area being treated.  Active infections.  Blood clots.  Cancer.  Inability to communicate your response to pain. This can include young children and people with dementia. HOME CARE INSTRUCTIONS Moist heat pack  Soak a clean towel in warm water, and squeeze out the extra water. The water temperature should be comfortable to the skin.  Put the warm, wet towel in a  plastic bag.  Place a thin, dry towel between your skin and the bag.  Put the heat pack on the area for 5 minutes, and check your skin. Your skin may be pink, but it should not be red.  Leave the heat pack on the area for a total of 15 to 30 minutes.  Repeat this every 2 to 4 hours while awake. Do not use heat while you are sleeping. Warm water bath  Fill a tub with warm water. The water temperature should be comfortable to the skin.  Place the affected body part in the tub.  Soak the area for 20 to 40 minutes.  Repeat as needed. Hot water bottle  Fill the water bottle half full with hot water.  Press out the extra air. Close the cap tightly.  Place a dry towel between your skin and the bottle.  Put the bottle on the area for 5 minutes, and  check your skin. Your skin may be pink, but it should not be red.  Leave the bottle on the area for a total of 15 to 30 minutes.  Repeat this every 2 to 4 hours while awake. Electric heating pad  Place a dry towel between your skin and the heating pad.  Set the heating pad on low heat.  Put the heating pad on the area for 10 minutes, and check your skin. Your skin may be pink, but it should not be red.  Leave the heating pad on the area for a total of 20 to 40 minutes.  Repeat this every 2 to 4 hours while awake.  Do not lie on the heating pad.  Do not fall asleep while using the heating pad.  Do not use the heating pad near water. Contact with water can result in an electrical shock. SEEK MEDICAL CARE IF:  You have blisters, redness, swelling, or numbness.  You have any new problems.  Your problems are getting worse.  You have any questions or concerns. If you develop any problems, stop using heat therapy until you see your caregiver. MAKE SURE YOU:  Understand these instructions.  Will watch your condition.  Will get help right away if you are not doing well or get worse. Document Released: 11/02/2011 Document  Reviewed: 11/02/2011 [facility] Patient Information 2014 [place]
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
## 2013-09-24 ENCOUNTER — Encounter (HOSPITAL_COMMUNITY): Payer: Self-pay | Admitting: Emergency Medicine

## 2013-09-24 ENCOUNTER — Emergency Department (HOSPITAL_COMMUNITY): Payer: Medicaid Other

## 2013-09-24 ENCOUNTER — Emergency Department (HOSPITAL_COMMUNITY)
Admission: EM | Admit: 2013-09-24 | Discharge: 2013-09-24 | Disposition: A | Discharge status: Home / Self Care | Payer: Medicaid Other | Attending: Emergency Medicine | Admitting: Emergency Medicine

## 2013-09-24 DIAGNOSIS — F17200 Nicotine dependence, unspecified, uncomplicated: Secondary | ICD-10-CM | POA: Insufficient documentation

## 2013-09-24 DIAGNOSIS — IMO0002 Reserved for concepts with insufficient information to code with codable children: Secondary | ICD-10-CM | POA: Insufficient documentation

## 2013-09-24 DIAGNOSIS — M546 Pain in thoracic spine: Secondary | ICD-10-CM

## 2013-09-24 DIAGNOSIS — Z3202 Encounter for pregnancy test, result negative: Secondary | ICD-10-CM | POA: Insufficient documentation

## 2013-09-24 DIAGNOSIS — S0990XA Unspecified injury of head, initial encounter: Secondary | ICD-10-CM

## 2013-09-24 DIAGNOSIS — Z8659 Personal history of other mental and behavioral disorders: Secondary | ICD-10-CM | POA: Insufficient documentation

## 2013-09-24 DIAGNOSIS — R55 Syncope and collapse: Secondary | ICD-10-CM | POA: Insufficient documentation

## 2013-09-24 DIAGNOSIS — Z8742 Personal history of other diseases of the female genital tract: Secondary | ICD-10-CM | POA: Insufficient documentation

## 2013-09-24 DIAGNOSIS — Z87442 Personal history of urinary calculi: Secondary | ICD-10-CM | POA: Insufficient documentation

## 2013-09-24 LAB — URINALYSIS, ROUTINE W REFLEX MICROSCOPIC
Bilirubin Urine: NEGATIVE
GLUCOSE, UA: NEGATIVE mg/dL
HGB URINE DIPSTICK: NEGATIVE
Ketones, ur: NEGATIVE mg/dL
LEUKOCYTES UA: NEGATIVE
Nitrite: NEGATIVE
PH: 5.5 (ref 5.0–8.0)
Protein, ur: NEGATIVE mg/dL
SPECIFIC GRAVITY, URINE: 1.025 (ref 1.005–1.030)
Urobilinogen, UA: 0.2 mg/dL (ref 0.0–1.0)

## 2013-09-24 LAB — RAPID URINE DRUG SCREEN, HOSP PERFORMED
Amphetamines: NOT DETECTED
BARBITURATES: NOT DETECTED
BENZODIAZEPINES: POSITIVE — AB
Cocaine: NOT DETECTED
Opiates: POSITIVE — AB
TETRAHYDROCANNABINOL: NOT DETECTED

## 2013-09-24 LAB — ETHANOL: Alcohol, Ethyl (B): <11 mg/dL (ref 0–11)

## 2013-09-24 MED ORDER — ONDANSETRON 8 MG PO TBDP
8.0000 mg | ORAL_TABLET | Freq: Once | ORAL | Status: AC
  Administered 2013-09-24: 8 mg via ORAL
  Filled 2013-09-24: qty 1

## 2013-09-24 MED ORDER — CYCLOBENZAPRINE HCL 10 MG PO TABS
10.0000 mg | ORAL_TABLET | Freq: Once | ORAL | Status: AC
  Administered 2013-09-24: 10 mg via ORAL
  Filled 2013-09-24: qty 1

## 2013-09-24 MED ORDER — HYDROCODONE-ACETAMINOPHEN 5-325 MG PO TABS
1.0000 | ORAL_TABLET | Freq: Once | ORAL | Status: AC
  Administered 2013-09-24: 1 via ORAL
  Filled 2013-09-24: qty 1
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
## 2013-09-24 NOTE — ED Notes (Signed)
Easily ambulatory around nurses station w/o gait disturbance

## 2013-09-24 NOTE — Discharge Instructions (Signed)
Back Pain, Adult Back pain is very common. The pain often gets better over time. The cause of back pain is usually not dangerous. Most people can learn to manage their back pain on their own.  HOME CARE   Stay active. Start with short walks on flat ground if you can. Try to walk farther each day.  Do not sit, drive, or stand in one place for more than 30 minutes. Do not stay in bed.  Do not avoid exercise or work. Activity can help your back heal faster.  Be careful when you bend or lift an object. Bend at your knees, keep the object close to you, and do not twist.  Sleep on a firm mattress. Lie on your side, and bend your knees. If you lie on your back, put a pillow under your knees.  Only take medicines as told by your doctor.  Put ice on the injured area.  Put ice in a plastic bag.  Place a towel between your skin and the bag.  Leave the ice on for 15-20 minutes, 03-04 times a day for the first 2 to 3 days. After that, you can switch between ice and heat packs.  Ask your doctor about back exercises or massage.  Avoid feeling anxious or stressed. Find good ways to deal with stress, such as exercise. GET HELP RIGHT AWAY IF:   Your pain does not go away with rest or medicine.  Your pain does not go away in 1 week.  You have new problems.  You do not feel well.  The pain spreads into your legs.  You cannot control when you poop (bowel movement) or pee (urinate).  Your arms or legs feel weak or lose feeling (numbness).  You feel sick to your stomach (nauseous) or throw up (vomit).  You have belly (abdominal) pain.  You feel like you may pass out (faint). MAKE SURE YOU:   Understand these instructions.  Will watch your condition.  Will get help right away if you are not doing well or get worse. Document Released: 01/27/2008 Document Revised: 11/02/2011 Document Reviewed: 12/29/2010 [facility] Patient Information 2014 [employer], [place].  Head Injury, Adult You have  a head injury. Headaches and throwing up (vomiting) are common after a head injury. It should be easy to wake up from sleeping. Sometimes you must stay in the hospital. Most problems happen within the first 24 hours. Side effects may occur up to 7 10 days after the injury.  WHAT ARE THE TYPES OF HEAD INJURIES? Head injuries can be as minor as a bump. Some head injuries can be more severe. More severe head injuries include:  A jarring injury to the brain (concussion).  A bruise of the brain (contusion). This mean there is bleeding in the brain that can cause swelling.  A cracked skull (skull fracture).  Bleeding in the brain that collects, clots, and forms a bump (hematoma). . WHEN SHOULD I GET HELP RIGHT AWAY?   You are confused or sleepy.  You cannot be woken up.  You feel sick to your stomach (nauseous) or keep throwing up.  Your dizziness or unsteadiness is get worse.  You have very bad, lasting headaches that are not helped by medicine.  You cannot use your arms or legs like normal  You cannot walk.  You notice changes in the black spots in the center of the colored part of your eye (pupil).  You have clear or bloody fluid coming from your nose or ears.  You have trouble seeing. During the next 24 hours after the injury, you must stay with someone who can watch you. This person should get help right away (call 911 in the [place] if you start to shake and are not able to control it (seizures), you become pass out, or you are unable to wake up. HOW CAN I PREVENT A HEAD INJURY IN THE FUTURE?  Wear seat belts.  Wear helmets while bike riding and playing sports like football.  Stay away from dangerous activities around the house. WHEN CAN I RETURN TO NORMAL ACTIVITIES AND ATHLETICS? See your doctor before doing these activities. You should not do normal activities or play contact sports until 1 week after the following symptoms have stopped:  Headache that does not go  away.  Dizziness.  Poor attention.  Confusion.  Memory problems.  Sickness to your stomach or throwing up.  Tiredness.  Fussiness.  Bothered by bright lights or loud noises.  Anxiousness or depression.  Restless sleep. MAKE SURE YOU:   Understand these instructions.  Will watch your condition.  Will get help right away if you are not doing well or get worse. Document Released: 07/23/2008 Document Revised: 05/31/2013 Document Reviewed: 04/17/2013 [facility] Patient Information 2014 [place], [place].
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
## 2013-09-25 ENCOUNTER — Emergency Department (HOSPITAL_COMMUNITY)
Admission: EM | Admit: 2013-09-25 | Discharge: 2013-09-25 | Disposition: A | Discharge status: Home / Self Care | Payer: Medicaid Other | Attending: Emergency Medicine | Admitting: Emergency Medicine

## 2013-09-25 ENCOUNTER — Encounter (HOSPITAL_COMMUNITY): Payer: Self-pay | Admitting: Emergency Medicine

## 2013-09-25 DIAGNOSIS — M549 Dorsalgia, unspecified: Secondary | ICD-10-CM

## 2013-09-25 DIAGNOSIS — Z8742 Personal history of other diseases of the female genital tract: Secondary | ICD-10-CM | POA: Insufficient documentation

## 2013-09-25 DIAGNOSIS — R209 Unspecified disturbances of skin sensation: Secondary | ICD-10-CM | POA: Insufficient documentation

## 2013-09-25 DIAGNOSIS — F17200 Nicotine dependence, unspecified, uncomplicated: Secondary | ICD-10-CM | POA: Insufficient documentation

## 2013-09-25 DIAGNOSIS — R51 Headache: Secondary | ICD-10-CM | POA: Insufficient documentation

## 2013-09-25 DIAGNOSIS — Z87442 Personal history of urinary calculi: Secondary | ICD-10-CM | POA: Insufficient documentation

## 2013-09-25 DIAGNOSIS — G8911 Acute pain due to trauma: Secondary | ICD-10-CM | POA: Insufficient documentation

## 2013-09-25 DIAGNOSIS — Z8679 Personal history of other diseases of the circulatory system: Secondary | ICD-10-CM | POA: Insufficient documentation

## 2013-09-25 DIAGNOSIS — M542 Cervicalgia: Secondary | ICD-10-CM | POA: Insufficient documentation

## 2013-09-25 DIAGNOSIS — Z8659 Personal history of other mental and behavioral disorders: Secondary | ICD-10-CM | POA: Insufficient documentation

## 2013-09-25 LAB — POCT PREGNANCY, URINE: Preg Test, Ur: NEGATIVE

## 2013-09-25 MED ORDER — DIAZEPAM 5 MG PO TABS
5.0000 mg | ORAL_TABLET | Freq: Once | ORAL | Status: AC
  Administered 2013-09-25: 5 mg via ORAL
  Filled 2013-09-25: qty 1

## 2013-09-25 MED ORDER — DIAZEPAM 5 MG PO TABS: 5.0000 mg | ORAL_TABLET | Freq: Two times a day (BID) | ORAL | Status: DC

## 2013-09-25 MED ORDER — KETOROLAC TROMETHAMINE 60 MG/2ML IM SOLN
60.0000 mg | Freq: Once | INTRAMUSCULAR | Status: AC
  Administered 2013-09-25: 60 mg via INTRAMUSCULAR
  Filled 2013-09-25: qty 2
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
## 2013-09-25 NOTE — Discharge Instructions (Signed)
Back Exercises Back exercises help treat and prevent back injuries. The goal of back exercises is to increase the strength of your abdominal and back muscles and the flexibility of your back. These exercises should be started when you no longer have back pain. Back exercises include:  Pelvic Tilt. Lie on your back with your knees bent. Tilt your pelvis until the lower part of your back is against the floor. Hold this position 5 to 10 sec and repeat 5 to 10 times.  Knee to Chest. Pull first 1 knee up against your chest and hold for 20 to 30 seconds, repeat this with the other knee, and then both knees. This may be done with the other leg straight or bent, whichever feels better.  Sit-Ups or Curl-Ups. Bend your knees 90 degrees. Start with tilting your pelvis, and do a partial, slow sit-up, lifting your trunk only 30 to 45 degrees off the floor. Take at least 2 to 3 seconds for each sit-up. Do not do sit-ups with your knees out straight. If partial sit-ups are difficult, simply do the above but with only tightening your abdominal muscles and holding it as directed.  Hip-Lift. Lie on your back with your knees flexed 90 degrees. Push down with your feet and shoulders as you raise your hips a couple inches off the floor; hold for 10 seconds, repeat 5 to 10 times.  Back arches. Lie on your stomach, propping yourself up on bent elbows. Slowly press on your hands, causing an arch in your low back. Repeat 3 to 5 times. Any initial stiffness and discomfort should lessen with repetition over time.  Shoulder-Lifts. Lie face down with arms beside your body. Keep hips and torso pressed to floor as you slowly lift your head and shoulders off the floor. Do not overdo your exercises, especially in the beginning. Exercises may cause you some mild back discomfort which lasts for a few minutes; however, if the pain is more severe, or lasts for more than 15 minutes, do not continue exercises until you see your caregiver.  Improvement with exercise therapy for back problems is slow.  See your caregivers for assistance with developing a proper back exercise program. Document Released: 09/17/2004 Document Revised: 11/02/2011 Document Reviewed: 06/11/2011 [facility] Patient Information 2014 [place], [place].   Take Valium for muscle spasms, may cause drowsiness, take in the evening before bed time Use heat to affected areas Follow-up with PCP if no improvement

## 2013-09-25 NOTE — ED Provider Notes (Signed)
Medical screening examination/treatment/procedure(s) were performed by non-physician practitioner and as supervising physician I was immediately available for consultation/collaboration.  EKG Interpretation   None         Alyviah Crandle, MD 09/25/13 1729 
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
## 2013-09-30 ENCOUNTER — Emergency Department (HOSPITAL_COMMUNITY)
Admission: EM | Admit: 2013-09-30 | Discharge: 2013-09-30 | Discharge status: Against Medical Advice | Payer: Medicaid Other | Attending: Emergency Medicine | Admitting: Emergency Medicine

## 2013-09-30 ENCOUNTER — Encounter (HOSPITAL_COMMUNITY): Payer: Self-pay | Admitting: Emergency Medicine

## 2013-09-30 DIAGNOSIS — F17200 Nicotine dependence, unspecified, uncomplicated: Secondary | ICD-10-CM | POA: Insufficient documentation

## 2013-09-30 DIAGNOSIS — Z87442 Personal history of urinary calculi: Secondary | ICD-10-CM | POA: Insufficient documentation

## 2013-09-30 DIAGNOSIS — F411 Generalized anxiety disorder: Secondary | ICD-10-CM | POA: Insufficient documentation

## 2013-09-30 DIAGNOSIS — Z9889 Other specified postprocedural states: Secondary | ICD-10-CM | POA: Insufficient documentation

## 2013-09-30 DIAGNOSIS — Z8742 Personal history of other diseases of the female genital tract: Secondary | ICD-10-CM | POA: Insufficient documentation

## 2013-09-30 DIAGNOSIS — Z765 Malingerer [conscious simulation]: Secondary | ICD-10-CM

## 2013-09-30 DIAGNOSIS — R109 Unspecified abdominal pain: Secondary | ICD-10-CM | POA: Insufficient documentation

## 2013-09-30 DIAGNOSIS — Z79899 Other long term (current) drug therapy: Secondary | ICD-10-CM | POA: Insufficient documentation

## 2013-09-30 DIAGNOSIS — Z9089 Acquired absence of other organs: Secondary | ICD-10-CM | POA: Insufficient documentation

## 2013-09-30 LAB — URINALYSIS, ROUTINE W REFLEX MICROSCOPIC
Bilirubin Urine: NEGATIVE
Glucose, UA: NEGATIVE mg/dL
Hgb urine dipstick: NEGATIVE
Ketones, ur: NEGATIVE mg/dL
Leukocytes, UA: NEGATIVE
Nitrite: NEGATIVE
Protein, ur: NEGATIVE mg/dL
Specific Gravity, Urine: 1.01 (ref 1.005–1.030)
Urobilinogen, UA: 0.2 mg/dL (ref 0.0–1.0)
pH: 5.5 (ref 5.0–8.0)

## 2013-09-30 MED ORDER — ONDANSETRON 4 MG PO TBDP
4.0000 mg | ORAL_TABLET | Freq: Once | ORAL | Status: AC
  Administered 2013-09-30: 4 mg via ORAL
  Filled 2013-09-30: qty 1

## 2013-09-30 MED ORDER — ONDANSETRON HCL 4 MG/2ML IJ SOLN: 4.0000 mg | Freq: Once | INTRAMUSCULAR | Status: DC

## 2013-09-30 MED ORDER — ACETAMINOPHEN 325 MG PO TABS
650.0000 mg | ORAL_TABLET | Freq: Once | ORAL | Status: AC
  Administered 2013-09-30: 650 mg via ORAL
  Filled 2013-09-30: qty 2
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
## 2013-09-30 NOTE — ED Notes (Signed)
Radiology [facility] denies [facility]icit[facility].

## 2013-09-30 NOTE — ED Notes (Signed)
[facility] and advised she had [facility], states she did not want to [facility] and opted to come here.

## 2013-09-30 NOTE — ED Notes (Signed)
Pt c/o RLQ pain as well as n/v. Pt states [facility] was [facility] and also states "they said I had appendicitis but I left because I wanted to come here and see Dr. Lovell SheehanJenkins".

## 2013-09-30 NOTE — ED Notes (Signed)
Pt left ER before being discharged. Dr Juleen ChinaKohut notified.

## 2013-10-01 LAB — CBC WITH DIFFERENTIAL/PLATELET
BASOS ABS: 0.1 10*3/uL (ref 0.0–0.1)
BASOS PCT: 0.6 %
EOS PCT: 5.1 %
Eosinophil #: 0.6 10*3/uL (ref 0.0–0.7)
HCT: 41.3 % (ref 35.0–47.0)
HGB: 14.4 g/dL (ref 12.0–16.0)
Lymphocyte #: 2.8 10*3/uL (ref 1.0–3.6)
Lymphocyte %: 25.7 %
MCH: 34.1 pg — ABNORMAL HIGH (ref 26.0–34.0)
MCHC: 35 g/dL (ref 32.0–36.0)
MCV: 97 fL (ref 80–100)
MONOS PCT: 4.1 %
Monocyte #: 0.4 x10 3/mm (ref 0.2–0.9)
Neutrophil #: 7.1 10*3/uL — ABNORMAL HIGH (ref 1.4–6.5)
Neutrophil %: 64.5 %
PLATELETS: 232 10*3/uL (ref 150–440)
RBC: 4.24 10*6/uL (ref 3.80–5.20)
RDW: 12.5 % (ref 11.5–14.5)
WBC: 11 10*3/uL (ref 3.6–11.0)

## 2013-10-01 LAB — COMPREHENSIVE METABOLIC PANEL
ALBUMIN: 3.5 g/dL (ref 3.4–5.0)
ALK PHOS: 108 U/L
AST: 23 U/L (ref 15–37)
Anion Gap: 5 — ABNORMAL LOW (ref 7–16)
BILIRUBIN TOTAL: 0.3 mg/dL (ref 0.2–1.0)
BUN: 5 mg/dL — ABNORMAL LOW (ref 7–18)
CHLORIDE: 109 mmol/L — AB (ref 98–107)
CREATININE: 0.8 mg/dL (ref 0.60–1.30)
Calcium, Total: 8.9 mg/dL (ref 8.5–10.1)
Co2: 24 mmol/L (ref 21–32)
EGFR (African American): >60
EGFR (Non-African Amer.): >60
GLUCOSE: 90 mg/dL (ref 65–99)
Osmolality: 272 (ref 275–301)
POTASSIUM: 3.6 mmol/L (ref 3.5–5.1)
SGPT (ALT): 69 U/L (ref 12–78)
SODIUM: 138 mmol/L (ref 136–145)
Total Protein: 6.9 g/dL (ref 6.4–8.2)

## 2013-10-01 LAB — URINALYSIS, COMPLETE
BILIRUBIN, UR: NEGATIVE
Bacteria: NEGATIVE
Blood: NEGATIVE
Glucose,UR: NEGATIVE mg/dL (ref 0–75)
Ketone: NEGATIVE
Leukocyte Esterase: NEGATIVE
NITRITE: NEGATIVE
PH: 7 (ref 4.5–8.0)
PROTEIN: NEGATIVE
Specific Gravity: 1.012 (ref 1.003–1.030)

## 2013-10-01 LAB — LIPASE, BLOOD: Lipase: 303 U/L (ref 73–393)

## 2013-10-01 LAB — WET PREP, GENITAL

## 2013-10-02 ENCOUNTER — Encounter (HOSPITAL_COMMUNITY): Payer: Self-pay | Admitting: Emergency Medicine

## 2013-10-02 ENCOUNTER — Emergency Department (HOSPITAL_COMMUNITY): Payer: Medicaid Other

## 2013-10-02 ENCOUNTER — Observation Stay: Payer: Self-pay | Admitting: Surgery

## 2013-10-02 ENCOUNTER — Emergency Department (HOSPITAL_COMMUNITY)
Admission: EM | Admit: 2013-10-02 | Discharge: 2013-10-02 | Discharge status: Against Medical Advice | Payer: Medicaid Other | Attending: Emergency Medicine | Admitting: Emergency Medicine

## 2013-10-02 DIAGNOSIS — S40019A Contusion of unspecified shoulder, initial encounter: Secondary | ICD-10-CM | POA: Insufficient documentation

## 2013-10-02 DIAGNOSIS — Z9089 Acquired absence of other organs: Secondary | ICD-10-CM | POA: Insufficient documentation

## 2013-10-02 DIAGNOSIS — Y9389 Activity, other specified: Secondary | ICD-10-CM | POA: Insufficient documentation

## 2013-10-02 DIAGNOSIS — F17200 Nicotine dependence, unspecified, uncomplicated: Secondary | ICD-10-CM | POA: Insufficient documentation

## 2013-10-02 DIAGNOSIS — Z87442 Personal history of urinary calculi: Secondary | ICD-10-CM | POA: Insufficient documentation

## 2013-10-02 DIAGNOSIS — Y92410 Unspecified street and highway as the place of occurrence of the external cause: Secondary | ICD-10-CM | POA: Insufficient documentation

## 2013-10-02 DIAGNOSIS — Z8679 Personal history of other diseases of the circulatory system: Secondary | ICD-10-CM | POA: Insufficient documentation

## 2013-10-02 DIAGNOSIS — Z8742 Personal history of other diseases of the female genital tract: Secondary | ICD-10-CM | POA: Insufficient documentation

## 2013-10-02 DIAGNOSIS — S3981XA Other specified injuries of abdomen, initial encounter: Secondary | ICD-10-CM | POA: Insufficient documentation

## 2013-10-02 DIAGNOSIS — F411 Generalized anxiety disorder: Secondary | ICD-10-CM | POA: Insufficient documentation

## 2013-10-02 DIAGNOSIS — Z79899 Other long term (current) drug therapy: Secondary | ICD-10-CM | POA: Insufficient documentation

## 2013-10-02 LAB — CBC WITH DIFFERENTIAL/PLATELET
BASOS ABS: 0 10*3/uL (ref 0.0–0.1)
BASOS PCT: 0 % (ref 0–1)
EOS ABS: 0.3 10*3/uL (ref 0.0–0.7)
EOS PCT: 3 % (ref 0–5)
HEMATOCRIT: 37.6 % (ref 36.0–46.0)
Hemoglobin: 13.2 g/dL (ref 12.0–15.0)
Lymphocytes Relative: 18 % (ref 12–46)
Lymphs Abs: 2.1 10*3/uL (ref 0.7–4.0)
MCH: 33.7 pg (ref 26.0–34.0)
MCHC: 35.1 g/dL (ref 30.0–36.0)
MCV: 95.9 fL (ref 78.0–100.0)
MONO ABS: 0.6 10*3/uL (ref 0.1–1.0)
Monocytes Relative: 5 % (ref 3–12)
Neutro Abs: 9 10*3/uL — ABNORMAL HIGH (ref 1.7–7.7)
Neutrophils Relative %: 74 % (ref 43–77)
PLATELETS: 234 10*3/uL (ref 150–400)
RBC: 3.92 MIL/uL (ref 3.87–5.11)
RDW: 12.2 % (ref 11.5–15.5)
WBC: 12 10*3/uL — ABNORMAL HIGH (ref 4.0–10.5)

## 2013-10-02 LAB — BASIC METABOLIC PANEL
BUN: 5 mg/dL — ABNORMAL LOW (ref 6–23)
CO2: 25 mEq/L (ref 19–32)
CREATININE: 0.76 mg/dL (ref 0.50–1.10)
Calcium: 8.9 mg/dL (ref 8.4–10.5)
Chloride: 101 mEq/L (ref 96–112)
GFR calc Af Amer: >90 mL/min (ref >90)
Glucose, Bld: 97 mg/dL (ref 70–99)
Potassium: 3.8 mEq/L (ref 3.7–5.3)
SODIUM: 136 meq/L — AB (ref 137–147)

## 2013-10-02 LAB — GC/CHLAMYDIA PROBE AMP

## 2013-10-02 MED ORDER — IOHEXOL 300 MG/ML  SOLN
100.0000 mL | Freq: Once | INTRAMUSCULAR | Status: AC | PRN
  Administered 2013-10-02: 100 mL via INTRAVENOUS
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
## 2013-10-02 NOTE — ED Notes (Addendum)
Pt expressing desire to leave. Encouraged pt to stay pending radiology results.  Pt very angry that she has not been given requested dilaudid. Pt also asking if she will be discharged with Percocet.  I asked pt if she was given a prescription for pain medications following her reported surgery yesterday.  Pt states "I haven't gotten them filled yet."  Explained to pt that if she has a current prescription, then our EDP will probably not discharge her with a second prescription for pain medications or a pre-pack.  Pt extremely angry and demanded to leave.  Again encouraged pt to stay till physician discussed radiology results with her.  Explained risk of leaving AMA.  Pt stated,  "I don't care, this is bullshit."   IV discontinued and pt left department.  No difficulty noted with ambulation.

## 2013-10-03 LAB — PATHOLOGY REPORT

## 2013-10-05 ENCOUNTER — Emergency Department: Payer: Self-pay | Admitting: Emergency Medicine

## 2013-10-05 LAB — COMPREHENSIVE METABOLIC PANEL
ALBUMIN: 3.6 g/dL (ref 3.4–5.0)
ANION GAP: 9 (ref 7–16)
AST: 39 U/L — AB (ref 15–37)
Alkaline Phosphatase: 175 U/L — ABNORMAL HIGH
BUN: 5 mg/dL — AB (ref 7–18)
Bilirubin,Total: 0.6 mg/dL (ref 0.2–1.0)
CALCIUM: 9.5 mg/dL (ref 8.5–10.1)
CHLORIDE: 107 mmol/L (ref 98–107)
CREATININE: 0.67 mg/dL (ref 0.60–1.30)
Co2: 21 mmol/L (ref 21–32)
EGFR (African American): >60
EGFR (Non-African Amer.): >60
Glucose: 102 mg/dL — ABNORMAL HIGH (ref 65–99)
Osmolality: 271 (ref 275–301)
POTASSIUM: 3.5 mmol/L (ref 3.5–5.1)
SGPT (ALT): 103 U/L — ABNORMAL HIGH (ref 12–78)
Sodium: 137 mmol/L (ref 136–145)
Total Protein: 7.9 g/dL (ref 6.4–8.2)

## 2013-10-05 LAB — URINALYSIS, COMPLETE
BLOOD: NEGATIVE
Glucose,UR: NEGATIVE mg/dL (ref 0–75)
NITRITE: NEGATIVE
Ph: 5 (ref 4.5–8.0)
Protein: 30
RBC,UR: 2 /HPF (ref 0–5)
Specific Gravity: 1.03 (ref 1.003–1.030)
WBC UR: 6 /HPF (ref 0–5)

## 2013-10-05 LAB — CBC WITH DIFFERENTIAL/PLATELET
Basophil #: 0.1 10*3/uL (ref 0.0–0.1)
Basophil %: 0.8 %
Eosinophil #: 0.1 10*3/uL (ref 0.0–0.7)
Eosinophil %: 0.7 %
HCT: 41.9 % (ref 35.0–47.0)
HGB: 14.8 g/dL (ref 12.0–16.0)
LYMPHS PCT: 12.1 %
Lymphocyte #: 1.3 10*3/uL (ref 1.0–3.6)
MCH: 33.9 pg (ref 26.0–34.0)
MCHC: 35.2 g/dL (ref 32.0–36.0)
MCV: 96 fL (ref 80–100)
Monocyte #: 0.4 x10 3/mm (ref 0.2–0.9)
Monocyte %: 3.3 %
NEUTROS ABS: 8.9 10*3/uL — AB (ref 1.4–6.5)
Neutrophil %: 83.1 %
PLATELETS: 276 10*3/uL (ref 150–440)
RBC: 4.36 10*6/uL (ref 3.80–5.20)
RDW: 12.4 % (ref 11.5–14.5)
WBC: 10.7 10*3/uL (ref 3.6–11.0)

## 2013-10-05 LAB — LIPASE, BLOOD: Lipase: 111 U/L (ref 73–393)

## 2013-11-22 ENCOUNTER — Encounter (HOSPITAL_COMMUNITY): Payer: Self-pay | Admitting: Emergency Medicine

## 2013-11-22 ENCOUNTER — Emergency Department (HOSPITAL_COMMUNITY): Payer: Medicaid Other

## 2013-11-22 DIAGNOSIS — Z79899 Other long term (current) drug therapy: Secondary | ICD-10-CM

## 2013-11-22 DIAGNOSIS — Y92009 Unspecified place in unspecified non-institutional (private) residence as the place of occurrence of the external cause: Secondary | ICD-10-CM | POA: Insufficient documentation

## 2013-11-22 DIAGNOSIS — S63509A Unspecified sprain of unspecified wrist, initial encounter: Secondary | ICD-10-CM | POA: Insufficient documentation

## 2013-11-22 DIAGNOSIS — F3289 Other specified depressive episodes: Secondary | ICD-10-CM | POA: Insufficient documentation

## 2013-11-22 DIAGNOSIS — Z8679 Personal history of other diseases of the circulatory system: Secondary | ICD-10-CM | POA: Insufficient documentation

## 2013-11-22 DIAGNOSIS — G8929 Other chronic pain: Secondary | ICD-10-CM

## 2013-11-22 DIAGNOSIS — F17200 Nicotine dependence, unspecified, uncomplicated: Secondary | ICD-10-CM | POA: Insufficient documentation

## 2013-11-22 DIAGNOSIS — F411 Generalized anxiety disorder: Secondary | ICD-10-CM

## 2013-11-22 DIAGNOSIS — W010XXA Fall on same level from slipping, tripping and stumbling without subsequent striking against object, initial encounter: Secondary | ICD-10-CM

## 2013-11-22 DIAGNOSIS — Z8742 Personal history of other diseases of the female genital tract: Secondary | ICD-10-CM

## 2013-11-22 DIAGNOSIS — Z87442 Personal history of urinary calculi: Secondary | ICD-10-CM

## 2013-11-22 DIAGNOSIS — Y93E5 Activity, floor mopping and cleaning: Secondary | ICD-10-CM | POA: Insufficient documentation

## 2013-11-22 DIAGNOSIS — F329 Major depressive disorder, single episode, unspecified: Secondary | ICD-10-CM | POA: Insufficient documentation

## 2013-11-22 NOTE — ED Notes (Signed)
Patient states she was mopping the floor and fell landing on her right wrist.  Patient c/o increased pain.

## 2013-11-23 ENCOUNTER — Emergency Department: Payer: Self-pay | Admitting: Emergency Medicine

## 2013-11-23 ENCOUNTER — Encounter (HOSPITAL_COMMUNITY): Payer: Self-pay | Admitting: Emergency Medicine

## 2013-11-23 ENCOUNTER — Emergency Department (HOSPITAL_COMMUNITY)
Admission: EM | Admit: 2013-11-23 | Discharge: 2013-11-23 | Disposition: A | Discharge status: Home / Self Care | Payer: Medicaid Other | Source: Home / Self Care | Attending: Emergency Medicine | Admitting: Emergency Medicine

## 2013-11-23 ENCOUNTER — Emergency Department (HOSPITAL_COMMUNITY)
Admission: EM | Admit: 2013-11-23 | Discharge: 2013-11-24 | Disposition: A | Discharge status: Home / Self Care | Payer: Medicaid Other | Attending: Emergency Medicine | Admitting: Emergency Medicine

## 2013-11-23 DIAGNOSIS — Y92009 Unspecified place in unspecified non-institutional (private) residence as the place of occurrence of the external cause: Secondary | ICD-10-CM

## 2013-11-23 DIAGNOSIS — F329 Major depressive disorder, single episode, unspecified: Secondary | ICD-10-CM

## 2013-11-23 DIAGNOSIS — W19XXXA Unspecified fall, initial encounter: Secondary | ICD-10-CM

## 2013-11-23 DIAGNOSIS — S63501A Unspecified sprain of right wrist, initial encounter: Secondary | ICD-10-CM

## 2013-11-23 DIAGNOSIS — F32A Depression, unspecified: Secondary | ICD-10-CM

## 2013-11-23 LAB — COMPREHENSIVE METABOLIC PANEL
ALK PHOS: 94 U/L
ALK PHOS: 87 U/L (ref 39–117)
ALT: 23 U/L (ref 0–35)
ANION GAP: 5 — AB (ref 7–16)
AST: 20 U/L (ref 15–37)
AST: 15 U/L (ref 0–37)
Albumin: 3.9 g/dL (ref 3.4–5.0)
Albumin: 4 g/dL (ref 3.5–5.2)
BUN: 12 mg/dL (ref 7–18)
BUN: 10 mg/dL (ref 6–23)
Bilirubin,Total: 0.3 mg/dL (ref 0.2–1.0)
CALCIUM: 8.9 mg/dL (ref 8.5–10.1)
CO2: 23 mEq/L (ref 19–32)
CREATININE: 0.83 mg/dL (ref 0.60–1.30)
Calcium: 9.5 mg/dL (ref 8.4–10.5)
Chloride: 111 mmol/L — ABNORMAL HIGH (ref 98–107)
Chloride: 104 mEq/L (ref 96–112)
Co2: 23 mmol/L (ref 21–32)
Creatinine, Ser: 0.78 mg/dL (ref 0.50–1.10)
GFR calc Af Amer: >90 mL/min (ref >90)
GFR calc non Af Amer: >90 mL/min (ref >90)
GLUCOSE: 95 mg/dL (ref 65–99)
GLUCOSE: 91 mg/dL (ref 70–99)
Osmolality: 277 (ref 275–301)
POTASSIUM: 3.8 meq/L (ref 3.7–5.3)
Potassium: 3.9 mmol/L (ref 3.5–5.1)
SGPT (ALT): 33 U/L (ref 12–78)
SODIUM: 139 meq/L (ref 137–147)
Sodium: 139 mmol/L (ref 136–145)
TOTAL PROTEIN: 7.6 g/dL (ref 6.4–8.2)
Total Bilirubin: 0.3 mg/dL (ref 0.3–1.2)
Total Protein: 7.5 g/dL (ref 6.0–8.3)

## 2013-11-23 LAB — CBC WITH DIFFERENTIAL/PLATELET
BASOS ABS: 0 10*3/uL (ref 0.0–0.1)
Basophil #: 0.1 10*3/uL (ref 0.0–0.1)
Basophil %: 1.3 %
Basophils Relative: 0 % (ref 0–1)
EOS ABS: 0.4 10*3/uL (ref 0.0–0.7)
EOS ABS: 0.2 10*3/uL (ref 0.0–0.7)
EOS PCT: 3.9 %
EOS PCT: 3 % (ref 0–5)
HCT: 42.3 % (ref 35.0–47.0)
HCT: 41 % (ref 36.0–46.0)
HGB: 14.4 g/dL (ref 12.0–16.0)
Hemoglobin: 14.3 g/dL (ref 12.0–15.0)
LYMPHS ABS: 2.6 10*3/uL (ref 1.0–3.6)
LYMPHS ABS: 2.3 10*3/uL (ref 0.7–4.0)
LYMPHS PCT: 30 % (ref 12–46)
Lymphocyte %: 25.4 %
MCH: 32.7 pg (ref 26.0–34.0)
MCH: 32.9 pg (ref 26.0–34.0)
MCHC: 34 g/dL (ref 32.0–36.0)
MCHC: 34.9 g/dL (ref 30.0–36.0)
MCV: 96 fL (ref 80–100)
MCV: 94.5 fL (ref 78.0–100.0)
MONOS PCT: 3.9 %
Monocyte #: 0.4 x10 3/mm (ref 0.2–0.9)
Monocytes Absolute: 0.4 10*3/uL (ref 0.1–1.0)
Monocytes Relative: 5 % (ref 3–12)
NEUTROS PCT: 65.5 %
NEUTROS PCT: 62 % (ref 43–77)
Neutro Abs: 4.8 10*3/uL (ref 1.7–7.7)
Neutrophil #: 6.6 10*3/uL — ABNORMAL HIGH (ref 1.4–6.5)
PLATELETS: 239 10*3/uL (ref 150–440)
Platelets: 226 10*3/uL (ref 150–400)
RBC: 4.4 10*6/uL (ref 3.80–5.20)
RBC: 4.34 MIL/uL (ref 3.87–5.11)
RDW: 12.4 % (ref 11.5–14.5)
RDW: 12.1 % (ref 11.5–15.5)
WBC: 10.1 10*3/uL (ref 3.6–11.0)
WBC: 7.7 10*3/uL (ref 4.0–10.5)

## 2013-11-23 LAB — URINALYSIS, COMPLETE
BLOOD: NEGATIVE
Bilirubin,UR: NEGATIVE
Glucose,UR: NEGATIVE mg/dL (ref 0–75)
KETONE: NEGATIVE
Leukocyte Esterase: NEGATIVE
Nitrite: NEGATIVE
PH: 5 (ref 4.5–8.0)
PROTEIN: NEGATIVE
Specific Gravity: 1.025 (ref 1.003–1.030)
WBC UR: 2 /HPF (ref 0–5)

## 2013-11-23 LAB — ETHANOL: Alcohol, Ethyl (B): <11 mg/dL (ref 0–11)

## 2013-11-23 NOTE — Discharge Instructions (Signed)
Contract for safety,  See your psychiatrist ASAP, recommend grief counseling for the death of your father

## 2013-11-23 NOTE — ED Provider Notes (Signed)
CSN: [id number]     Arrival date & time 11/22/13  2314 History   First MD Initiated Contact with Patient 11/23/13 0036     Chief Complaint  Patient presents with  . Wrist Injury     (Consider location/radiation/quality/duration/timing/severity/associated sxs/prior Treatment) HPI 28 year old female states she slipped at home while mopping and fell injuring her right wrist when her outstretched right hand at the floor. She denies other injury. She denies head or neck injury denies neck pain chest pain shortness breath or abdominal pain denies injury to her left arm or legs. She has isolated right wrist pain only. She denies pain to the right shoulder elbow and she denies weakness or numbness to the right hand. Her skin is intact. There is no swelling or discoloration or deformity to her right wrist. She has decreased range of motion and tenderness at the right wrist due to pain. This injury occurred just prior to arrival. She has had 22 emergency department visits in the last 6 months for multiple pain syndromes and multiple reported falls including right wrist pain previously as well. She has a history of chronic abdominal pain but that is not a concern of hers today. Past Medical History  Diagnosis Date  . Ovarian cyst   . Anxiety   . Bradycardia     pt says was [facility] but was never put on any medication for it.  . Renal disorder     kidney stone   Past Surgical History  Procedure Laterality Date  . Cesarean section      x2  . Appendectomy    . Cholecystectomy N/A 03/03/2013    Procedure: LAPAROSCOPIC CHOLECYSTECTOMY;  Surgeon: Dalia HeadingMark A Jenkins, MD;  Location: [facility];  Service: General;  Laterality: N/A;  . Cholecystectomy  2014   No family history on file. History  Substance Use Topics  . Smoking status: Current Every Day Smoker -- 0.50 packs/day for 3 years    Types: Cigarettes  . Smokeless tobacco: Never Used  . Alcohol Use: No   OB History   Grav Para Term  Preterm Abortions TAB SAB Ect Mult Living   2 2 2       2      Review of Systems 10 Systems reviewed and are negative for acute change except as noted in the HPI.   Allergies  Motrin; Naprosyn; and Tramadol  Home Medications   Current Outpatient Rx  Name  Route  Sig  Dispense  Refill  . ALPRAZolam (XANAX) 0.5 MG tablet   Oral   Take 0.5 mg by mouth at bedtime as needed for anxiety.         . diazepam (VALIUM) 5 MG tablet   Oral   Take 1 tablet (5 mg total) by mouth 2 (two) times daily.   10 tablet   0   . medroxyPROGESTERone (DEPO-PROVERA) 150 MG/ML injection   Intramuscular   Inject 150 mg into the muscle every 3 (three) months.         Marland Kitchen. oxyCODONE-acetaminophen (PERCOCET/ROXICET) 5-325 MG per tablet   Oral   Take by mouth every 4 (four) hours as needed for severe pain.          BP 102/66  Pulse 97  Temp(Src) 97.9 F (36.6 C) (Oral)  Resp 24  Ht 5\' 6"  (1.676 m)  Wt 220 lb (99.791 kg)  BMI 35.53 kg/m2  SpO2 99% Physical Exam  Nursing note and vitals reviewed. Constitutional:  Awake, alert,  nontoxic appearance.  HENT:  Head: Atraumatic.  Eyes: Right eye exhibits no discharge. Left eye exhibits no discharge.  Neck: Neck supple.  Cervical spine nontender  Pulmonary/Chest: Effort normal. She exhibits no tenderness.  Abdominal: Soft. There is no tenderness. There is no rebound.  Musculoskeletal: She exhibits tenderness.  Baseline ROM, no obvious new focal weakness. Back left arm and both legs are nontender. Right arm is nontender at the clavicle shoulder upper arm elbow and proximal forearm. Right hand is nontender with capillary refill less than 2 seconds normal light touch as well as intact strength in the distributions of the median radial and ulnar nerve function. Right wrist has no swelling or deformity or discoloration noted but does have diffuse tenderness not specifically to the snuffbox region.  Neurological: She is alert.  Mental status and motor  strength appears baseline for patient and situation.  Skin: No rash noted.  Psychiatric: She has a normal mood and affect.    ED Course  Procedures (including critical care time) Patient informed of clinical course, understand medical decision-making process, and agree with plan. Do not feel narcotics indicated. Labs Review Labs Reviewed - No data to display Imaging Review Dg Wrist Complete Right  11/22/2013   FALL CLINICAL DATA: Fall, right wrist pain  EXAM: RIGHT WRIST - COMPLETE 3+ VIEW  COMPARISON:  Prior radiographs of the right forearm 11/01/2013  FINDINGS: There is no evidence of fracture or dislocation. There is no evidence of arthropathy or other focal bone abnormality. Soft tissues are unremarkable.  IMPRESSION: Negative.   Electronically Signed   By: Malachy Moan M.D.   On: 11/22/2013 23:52     EKG Interpretation None      MDM   Final diagnoses:  Right wrist sprain  Fall at home    I doubt any other [facility] precluding discharge at this time including, but not necessarily limited to the following:neurovascular compromise.    Hurman Horn, MD 11/24/13 [phone]

## 2013-11-23 NOTE — ED Notes (Signed)
Tele-psych consult in process.

## 2013-11-23 NOTE — ED Notes (Signed)
PT BROUGHT IN WITH IVC PAPERS WITH [facility]. PAPERS STATE THAT PT IS ABUSING PRESCRIPTION PILLS AND BUYS PILLS OFF THE STREET. THE PETITIONER STATES PT HAS BEEN AGGRESSIVE, NOT EATING WELL, AND IS UP ALL NIGHT. SOMETHING ALSO WENT ON OVER FACEBOOK BUT PT WILL NOT ELABORATE. PT DENIES SI/HI. PT STATES SHE JUST WANTS TO LEAVE HER BOYFRIENDS HOUSE.

## 2013-11-23 NOTE — ED Notes (Signed)
telepsy in progress 

## 2013-11-23 NOTE — ED Provider Notes (Addendum)
CSN: [id number]     Arrival date & time 11/23/13  2005 History  This chart was scribed for Valerie HutchingBrian Grantham Hippert, MD by Ardelia Memsylan Malpass, ED Scribe. This patient was seen in room APA03/APA03 and the patient's care was started at 9:06 PM.   Chief Complaint  Patient presents with  . V70.1    The history is provided by the patient. No language interpreter was used.    HPI Comments: Valerie Robinson is a 28 y.o. Female with a history of anxiety [facility]C [facility] to the Emergency Department. Per the pt, pt's mother took out the IVC papers because she wanted pt to get off of her prescribed Xanax and she was concerned that pt has been buying prescription pills off of the streets. She states that she takes three 0.5 mg Xanax/day, only as prescribed for her anxiety. She states that she Is also currently taking Percocet due to having an appendectomy performed 1.5 weeks ago. She states that she was not taking any pain medications before then. Pt has been seen in the ED 23 times in the past 6 months. She was also [facility] this [facility] due to a suspected ovarian problem, but she has no complaints of this currently. She denies depression, SI or HI. She also has a history of cholecystectomy in 2014.   Past Medical History  Diagnosis Date  . Ovarian cyst   . Anxiety   . Bradycardia     pt says was [facility] but was never put on any medication for it.  . Renal disorder     kidney stone   Past Surgical History  Procedure Laterality Date  . Cesarean section      x2  . Cholecystectomy N/A 03/03/2013    Procedure: LAPAROSCOPIC CHOLECYSTECTOMY;  Surgeon: Dalia HeadingMark A Jenkins, MD;  Location: [facility];  Service: General;  Laterality: N/A;  . Cholecystectomy  2014  . Appendectomy     History reviewed. No pertinent family history. History  Substance Use Topics  . Smoking status: Current Every Day Smoker -- 0.50 packs/day for 3 years    Types:  Cigarettes  . Smokeless tobacco: Never Used  . Alcohol Use: No   OB History   Grav Para Term Preterm Abortions TAB SAB Ect Mult Living   2 2 2       2      Review of Systems A complete 10 system review of systems was obtained and all systems are negative except as noted in the HPI and PMH.   Allergies  Motrin; Naprosyn; and Tramadol  Home Medications   Current Outpatient Rx  Name  Route  Sig  Dispense  Refill  . ALPRAZolam (XANAX) 0.5 MG tablet   Oral   Take 0.5 mg by mouth at bedtime as needed for anxiety.         . diazepam (VALIUM) 5 MG tablet   Oral   Take 1 tablet (5 mg total) by mouth 2 (two) times daily.   10 tablet   0   . medroxyPROGESTERone (DEPO-PROVERA) 150 MG/ML injection   Intramuscular   Inject 150 mg into the muscle every 3 (three) months.         Marland Kitchen. oxyCODONE-acetaminophen (PERCOCET/ROXICET) 5-325 MG per tablet   Oral   Take by mouth every 4 (four) hours as needed for severe pain.          Triage Vitals: BP 110/63  Pulse  90  Temp(Src) 98.1 F (36.7 C) (Oral)  Resp 20  SpO2 100%  Physical Exam  Nursing note and vitals reviewed. Constitutional: She is oriented to person, place, and time. She appears well-developed and well-nourished.  HENT:  Head: Normocephalic and atraumatic.  Eyes: Conjunctivae and EOM are normal. Pupils are equal, round, and reactive to light.  Neck: Normal range of motion. Neck supple.  Cardiovascular: Normal rate, regular rhythm and normal heart sounds.   Pulmonary/Chest: Effort normal and breath sounds normal.  Abdominal: Soft. Bowel sounds are normal.  Musculoskeletal: Normal range of motion.  Neurological: She is alert and oriented to person, place, and time.  Skin: Skin is warm and dry.  Psychiatric: She has a normal mood and affect. Her behavior is normal.    ED Course  Procedures (including critical care time)  DIAGNOSTIC STUDIES: Oxygen Saturation is 100% on RA, normal by my interpretation.     COORDINATION OF CARE: 9:11 PM- Discussed plan for pt to have a TelePsych evaluation. Will also obtain labs. Pt advised of plan for treatment and pt agrees.  Labs Review Labs Reviewed  CBC WITH DIFFERENTIAL  COMPREHENSIVE METABOLIC PANEL  ETHANOL  URINE RAPID DRUG SCREEN (HOSP PERFORMED)   Imaging Review Dg Wrist Complete Right  11/22/2013   FALL CLINICAL DATA: Fall, right wrist pain  EXAM: RIGHT WRIST - COMPLETE 3+ VIEW  COMPARISON:  Prior radiographs of the right forearm 11/01/2013  FINDINGS: There is no evidence of fracture or dislocation. There is no evidence of arthropathy or other focal bone abnormality. Soft tissues are unremarkable.  IMPRESSION: Negative.   Electronically Signed   By: Malachy Moan M.D.   On: 11/22/2013 23:52     EKG Interpretation None      MDM   Final diagnoses:  None    No suicidal or homicidal ideation. Patient is not psychotic. Will consult with behavioral health. 2315    discussed with behavioral health consultant. No suicidal or homicidal ideation. No psychosis. Will resend commitment. Recommend contract for safety, grief counseling, visit to psychiatrist  I personally performed the services described in this documentation, which was scribed in my presence. The recorded information has been reviewed and is accurate.  Valerie Hutching, MD 11/23/13 [id number]  Valerie Hutching, MD 11/23/13 [phone]

## 2013-11-23 NOTE — ED Notes (Signed)
Tele-psych consult completed.   
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
## 2013-11-23 NOTE — Discharge Instructions (Signed)
Wrist injuries are frequent in adults and children. A sprain is an injury to the ligaments that hold your bones together. A strain is an injury to muscle or muscle tendons (cord like structure) from stretching or pulling. Generally, when wrists are moderately tender to touch following a fall or injury, a fracture (break in bone) may be present. Because of this, even if your x-rays were normal today, it is important that you receive follow-up care as suggested (you could still have a broken bone). Keep your arm raised above the level of your heart whenever possible to reduce swelling and pain.  Wear your splint for at least one week or until seen by a physician for a follow-up examination. SEEK IMMEDIATE MEDICAL ATTENTION IF: Your fingers are swollen very red, white, and cold or blue.  Your fingers are numb or tingling.  You have increasing pain or difficulty moving your fingers. Remember the importance of follow-up and possible follow-up x-rays. Improvement in pain level is not 100% insurance of not having a fracture.

## 2013-11-23 NOTE — ED Notes (Signed)
Pt to department with IVC papers.  Pt states she isn't sure why her mother took out papers, but  "I think she is trying to get my kids."  Pt alert and cooperative at this time.  No distress noted.  Pt denies any SI/HI.

## 2013-11-24 ENCOUNTER — Emergency Department: Payer: Self-pay | Admitting: Emergency Medicine

## 2013-11-24 NOTE — ED Notes (Signed)
SD to department to transport pt home.

## 2013-11-24 NOTE — ED Notes (Signed)
Pt resting quietly.  Pt has been discharged, awaiting on [facility] to provide transportation home.

## 2013-11-25 ENCOUNTER — Emergency Department: Payer: Self-pay | Admitting: Emergency Medicine

## 2013-11-25 LAB — WET PREP, GENITAL

## 2013-11-25 LAB — URINALYSIS, COMPLETE
BLOOD: NEGATIVE
Bilirubin,UR: NEGATIVE
Glucose,UR: NEGATIVE mg/dL (ref 0–75)
KETONE: NEGATIVE
Leukocyte Esterase: NEGATIVE
NITRITE: NEGATIVE
PH: 6 (ref 4.5–8.0)
Protein: NEGATIVE
RBC,UR: 1 /HPF (ref 0–5)
SPECIFIC GRAVITY: 1.02 (ref 1.003–1.030)
WBC UR: 1 /HPF (ref 0–5)

## 2013-11-25 LAB — GC/CHLAMYDIA PROBE AMP

## 2013-11-27 ENCOUNTER — Encounter (HOSPITAL_COMMUNITY): Payer: Self-pay | Admitting: Emergency Medicine

## 2013-11-27 ENCOUNTER — Emergency Department (HOSPITAL_COMMUNITY)
Admission: EM | Admit: 2013-11-27 | Discharge: 2013-11-27 | Discharge status: Against Medical Advice | Payer: Medicaid Other | Attending: Emergency Medicine | Admitting: Emergency Medicine

## 2013-11-27 DIAGNOSIS — M545 Low back pain, unspecified: Secondary | ICD-10-CM | POA: Insufficient documentation

## 2013-11-27 DIAGNOSIS — R1110 Vomiting, unspecified: Secondary | ICD-10-CM | POA: Insufficient documentation

## 2013-11-27 DIAGNOSIS — Z87442 Personal history of urinary calculi: Secondary | ICD-10-CM | POA: Insufficient documentation

## 2013-11-27 DIAGNOSIS — F17200 Nicotine dependence, unspecified, uncomplicated: Secondary | ICD-10-CM | POA: Insufficient documentation

## 2013-11-27 DIAGNOSIS — Z8679 Personal history of other diseases of the circulatory system: Secondary | ICD-10-CM | POA: Insufficient documentation

## 2013-11-27 DIAGNOSIS — M549 Dorsalgia, unspecified: Secondary | ICD-10-CM

## 2013-11-27 DIAGNOSIS — Z79899 Other long term (current) drug therapy: Secondary | ICD-10-CM | POA: Insufficient documentation

## 2013-11-27 DIAGNOSIS — R638 Other symptoms and signs concerning food and fluid intake: Secondary | ICD-10-CM | POA: Insufficient documentation

## 2013-11-27 DIAGNOSIS — Z8742 Personal history of other diseases of the female genital tract: Secondary | ICD-10-CM | POA: Insufficient documentation

## 2013-11-27 DIAGNOSIS — F411 Generalized anxiety disorder: Secondary | ICD-10-CM | POA: Insufficient documentation

## 2013-11-27 NOTE — ED Notes (Signed)
Per ems, pt called them d/t to some lower abd pain that is radiating to her legs bilaterally.  Pt reports " i think i may be pregnant in my tubes".  Pt reports that she is currently taking the Depo shot for birth control.  Pt denies any n/v/d.

## 2013-11-27 NOTE — ED Provider Notes (Signed)
CSN: [id number]     Arrival date & time 11/27/13  1528 History  This chart was scribed for Donnetta HutchingBrian Hansika Leaming, MD by Leone PayorSonum Patel, ED Scribe. This patient was seen in room [facility] and the patient's care was started 4:59 PM.    Chief Complaint  Patient presents with  . Abdominal Pain      The history is provided by the patient. No language interpreter was used.    HPI Comments: Valerie Robinson is a 28 y.o. female who presents to the Emergency Department complaining of constant abdominal pain that began 4 days ago. She reports taking tylenol with mild relief. She reports associated episodes of vomiting and as a result has decreased food intake. She also complains of right lower back pain with radiation to bilateral legs that began last night. She denies similar symptoms in the past. She denies recent falls or injuries.   Past Medical History  Diagnosis Date  . Ovarian cyst   . Anxiety   . Bradycardia     pt says was seen at [facility] for bradycardia but was never put on any medication for it.  . Renal disorder     kidney stone   Past Surgical History  Procedure Laterality Date  . Cesarean section      x2  . Appendectomy    . Cholecystectomy N/A 03/03/2013    Procedure: LAPAROSCOPIC CHOLECYSTECTOMY;  Surgeon: Dalia HeadingMark A Jenkins, MD;  Location: [facility];  Service: General;  Laterality: N/A;  . Cholecystectomy  2014   No family history on file. History  Substance Use Topics  . Smoking status: Current Every Day Smoker -- 0.50 packs/day for 3 years    Types: Cigarettes  . Smokeless tobacco: Never Used  . Alcohol Use: No   OB History   Grav Para Term Preterm Abortions TAB SAB Ect Mult Living   2 2 2       2      Review of Systems  A complete 10 system review of systems was obtained and all systems are negative except as noted in the HPI and PMH.    Allergies  Motrin; Naprosyn; and Tramadol  Home Medications   Current Outpatient Rx  Name  Route  Sig  Dispense  Refill  . ALPRAZolam  (XANAX) 0.5 MG tablet   Oral   Take 0.5 mg by mouth at bedtime as needed for anxiety.         . diazepam (VALIUM) 5 MG tablet   Oral   Take 1 tablet (5 mg total) by mouth 2 (two) times daily.   10 tablet   0   . medroxyPROGESTERone (DEPO-PROVERA) 150 MG/ML injection   Intramuscular   Inject 150 mg into the muscle every 3 (three) months.         Marland Kitchen. oxyCODONE-acetaminophen (PERCOCET/ROXICET) 5-325 MG per tablet   Oral   Take by mouth every 4 (four) hours as needed for severe pain.          BP 114/68  Pulse 82  Temp(Src) 98.1 F (36.7 C) (Oral)  Resp 16  SpO2 99% Physical Exam  Nursing note and vitals reviewed. Constitutional: She is oriented to person, place, and time. She appears well-developed and well-nourished.  HENT:  Head: Normocephalic and atraumatic.  Eyes: Conjunctivae and EOM are normal. Pupils are equal, round, and reactive to light.  Neck: Normal range of motion. Neck supple.  Cardiovascular: Normal rate, regular rhythm and normal heart sounds.   Pulmonary/Chest: Effort normal and breath  sounds normal.  Abdominal: Soft. Bowel sounds are normal.  Musculoskeletal: Normal range of motion. She exhibits tenderness (right lower back).  Neurological: She is alert and oriented to person, place, and time. Gait normal.  Skin: Skin is warm and dry.  Psychiatric: She has a normal mood and affect. Her behavior is normal.    ED Course  Procedures (including critical care time)  DIAGNOSTIC STUDIES: Oxygen Saturation is 99% on RA, normal by my interpretation.    COORDINATION OF CARE: 3:55 PM Discussed treatment plan with pt at bedside and pt agreed to plan.  4:10 PM Patient eloped.    Labs Review Labs Reviewed  PREGNANCY, URINE  URINALYSIS, ROUTINE W REFLEX MICROSCOPIC   Imaging Review No results found.   EKG Interpretation None      MDM   Final diagnoses:  Back pain    Care plan reviewed. Patient left prior to pelvic exam and labs. No acute  abdomen. Vital signs were stable.     I personally performed the services described in this documentation, which was scribed in my presence. The recorded information has been reviewed and is accurate.   Donnetta Hutching, MD 11/27/13 1700

## 2013-11-27 NOTE — ED Notes (Signed)
Pt walked out of ED. Pt seen walking across the street leaving the hospital. EDP aware.

## 2013-11-28 ENCOUNTER — Ambulatory Visit: Payer: Self-pay | Admitting: Physician Assistant

## 2013-12-20 ENCOUNTER — Emergency Department: Payer: Self-pay | Admitting: Emergency Medicine

## 2014-01-14 ENCOUNTER — Emergency Department: Payer: Self-pay | Admitting: Emergency Medicine

## 2014-01-16 ENCOUNTER — Emergency Department: Payer: Self-pay | Admitting: Emergency Medicine

## 2014-01-25 ENCOUNTER — Emergency Department: Payer: Self-pay | Admitting: Emergency Medicine

## 2014-02-10 ENCOUNTER — Emergency Department: Payer: Self-pay | Admitting: Emergency Medicine

## 2014-02-10 LAB — CBC WITH DIFFERENTIAL/PLATELET
BASOS PCT: 1.3 %
Basophil #: 0.1 10*3/uL (ref 0.0–0.1)
EOS ABS: 0.2 10*3/uL (ref 0.0–0.7)
Eosinophil %: 2.5 %
HCT: 40.5 % (ref 35.0–47.0)
HGB: 14.2 g/dL (ref 12.0–16.0)
Lymphocyte #: 2.8 10*3/uL (ref 1.0–3.6)
Lymphocyte %: 28.3 %
MCH: 33.7 pg (ref 26.0–34.0)
MCHC: 35 g/dL (ref 32.0–36.0)
MCV: 96 fL (ref 80–100)
MONO ABS: 0.4 x10 3/mm (ref 0.2–0.9)
Monocyte %: 4.3 %
NEUTROS ABS: 6.3 10*3/uL (ref 1.4–6.5)
Neutrophil %: 63.6 %
PLATELETS: 255 10*3/uL (ref 150–440)
RBC: 4.21 10*6/uL (ref 3.80–5.20)
RDW: 12.5 % (ref 11.5–14.5)
WBC: 9.9 10*3/uL (ref 3.6–11.0)

## 2014-02-10 LAB — COMPREHENSIVE METABOLIC PANEL
ALBUMIN: 3.7 g/dL (ref 3.4–5.0)
ALK PHOS: 103 U/L
Anion Gap: 6 — ABNORMAL LOW (ref 7–16)
BUN: 8 mg/dL (ref 7–18)
Bilirubin,Total: 0.5 mg/dL (ref 0.2–1.0)
CHLORIDE: 108 mmol/L — AB (ref 98–107)
CREATININE: 0.82 mg/dL (ref 0.60–1.30)
Calcium, Total: 9.1 mg/dL (ref 8.5–10.1)
Co2: 23 mmol/L (ref 21–32)
EGFR (Non-African Amer.): >60
Glucose: 79 mg/dL (ref 65–99)
Osmolality: 271 (ref 275–301)
POTASSIUM: 3.4 mmol/L — AB (ref 3.5–5.1)
SGOT(AST): 23 U/L (ref 15–37)
SGPT (ALT): 28 U/L (ref 12–78)
SODIUM: 137 mmol/L (ref 136–145)
Total Protein: 7.4 g/dL (ref 6.4–8.2)

## 2014-02-10 LAB — URINALYSIS, COMPLETE
Bilirubin,UR: NEGATIVE
GLUCOSE, UR: NEGATIVE mg/dL (ref 0–75)
KETONE: NEGATIVE
Nitrite: POSITIVE
PROTEIN: NEGATIVE
Ph: 5 (ref 4.5–8.0)
RBC,UR: 6 /HPF (ref 0–5)
Specific Gravity: 1.026 (ref 1.003–1.030)
Squamous Epithelial: 7
WBC UR: 23 /HPF (ref 0–5)

## 2014-03-10 ENCOUNTER — Emergency Department: Payer: Self-pay | Admitting: Emergency Medicine

## 2014-04-07 ENCOUNTER — Emergency Department: Payer: Self-pay | Admitting: Emergency Medicine

## 2014-04-30 ENCOUNTER — Emergency Department: Payer: Self-pay | Admitting: Emergency Medicine

## 2014-06-25 ENCOUNTER — Encounter (HOSPITAL_COMMUNITY): Payer: Self-pay | Admitting: Emergency Medicine

## 2014-11-01 ENCOUNTER — Emergency Department: Payer: Self-pay | Admitting: Emergency Medicine

## 2014-12-15 NOTE — H&P (Signed)
   Subjective/Chief Complaint RLQ pain, Nausea/vomiting   History of Present Illness [name] is a pleasant 29 yo F who presents with 3 days of RLQ pain, nausea/vomiting and anorexia.  She says that she developed nausea and vomiting at the same time with her pain.  + subjective chills.  Not tolerating diet.  No sick contacts.   Past History H/o cholecystectomy in July 2014 H/o c section H/o fibromyalgia H/o ovarian cyst   Past Med/Surgical Hx:  Ovarian Cyst:   Anxiety:   Denies medical history:   Cholecystectomy:   Cesarean Section:   ALLERGIES:  Tramadol: Rash  Naproxen: Rash  Family and Social History:  Family History Breast cancer   Social History positive  tobacco, negative ETOH, negative Illicit drugs, 1/2 ppd   + Tobacco Current (within 1 year)   Place of Living [place], here with Fiance   Review of Systems:  Subjective/Chief Complaint RLQ pain, nausea/vomiting   Fever/Chills Yes   Cough No   Sputum No   Abdominal Pain Yes   Diarrhea No   Constipation No   Nausea/Vomiting Yes   SOB/DOE No   Chest Pain No   Dysuria No   Tolerating Diet Nauseated  Vomiting   Physical Exam:  GEN well developed, well nourished, no acute distress   HEENT pink conjunctivae, PERRL, hearing intact to voice   RESP normal resp effort  clear BS  no use of accessory muscles   CARD regular rate  no murmur  no thrills   ABD positive tenderness  denies Flank Tenderness  no hernia  soft  normal BS   EXTR negative cyanosis/clubbing, negative edema   SKIN normal to palpation, No rashes, No ulcers   NEURO negative rigidity, negative tremor   PSYCH alert, A+O to time, place, person, good insight    Assessment/Admission Diagnosis [name] is a pleasant 29 yo F with RLQ pain, nausea/vomiting.  WBC 11.  CT shows dilated distal appendix.  Concern for tip appendicitis   Plan I have offered appendectomy due to her persistent symptoms and have spoken with her regarding  the risks and benefits of surgery.  She would like to proceed.   Electronic Signatures: Jarvis NewcomerLundquist, Varina Hulon A (MD)  (Signed 08-Feb-15 23:36)  Authored: CHIEF COMPLAINT and HISTORY, PAST MEDICAL/SURGIAL HISTORY, ALLERGIES, FAMILY AND SOCIAL HISTORY, REVIEW OF SYSTEMS, PHYSICAL EXAM, ASSESSMENT AND PLAN   Last Updated: 08-Feb-15 23:36 by Jarvis NewcomerLundquist, Francoise Chojnowski A (MD)

## 2014-12-15 NOTE — Op Note (Signed)
PATIENT NAME:  Valerie Robinson, Valerie Robinson DATE OF BIRTH:  08-14-1986  DATE OF PROCEDURE:  10/02/2013  PREOPERATIVE DIAGNOSIS: Acute appendicitis.  POSTOPERATIVE DIAGNOSIS: Acute appendicitis.  PROCEDURE PERFORMED: Laparoscopic appendectomy.   SURGEON: Valerie Roguehristopher Taressa Rauh, MD.  ANESTHESIA: General endotracheal.   ESTIMATED BLOOD LOSS: 25 mL.   COMPLICATIONS: None.   SPECIMEN: Appendix.   INDICATION FOR SURGERY: Valerie Robinson is a pleasant 29 year old female with approximately 3 days of right lower quadrant pain, leukocytosis and a CT scan concerning for a second dilated appendix. She was thus brought to the operating room for a laparoscopic appendectomy.   DETAILS OF PROCEDURE: As follows: Informed consent was obtained. Valerie Robinson was brought to the operating room suite. She was placed supine on the operating room table. She was induced. Endotracheal tube was placed. General anesthesia was administered. Her abdomen was then prepped and draped in standard surgical fashion. A timeout was then performed correctly identifying the patient name, operative site and procedure to be performed. An infraumbilical incision was made. It was deepened down to the fascia. The fascia was incised. The peritoneum was entered. Two stay sutures were placed in the fasciotomy. There were significant amounts of adhesions onto the underside of the peritoneum, which were swept to allow the [facility] to be placed. The abdomen was then insufflated. A left lower quadrant trocar was placed. Adhesions were then taken down between the omentum and the anterior abdominal wall. Once these adhesions were cleared, I placed a suprapubic 5 mm trocar. The appendix was then visualized. It was long and it did appear to be quite firm at the tip and injected. I then made a defect in the mesoappendix at the base of the appendix and fired across the base of the appendix flush with the cecum with an endoscopic stapler. I then used 3  fires of an endoscopic stapler white load to take the mesoappendix. The appendix was taken out with an Endo Catch bag through the umbilical port. I then achieved hemostasis the staple line. I then irrigated the abdomen when was I was satisfied with hemostasis. I then looked at the previously taken down adhesed omentum and I noted it to be hemostatic. I then removed all trocars under direct visualization. A supraumbilical trocar was closed with a figure-of-eight 0 Vicryl. The infraumbilical trocar site fascia was closed with a figure-of-eight 0 Vicryl. The skin incisions were then closed with interrupted 4-0 Monocryl deep dermal. Steri-Strips, Telfa gauze and Tegaderm were then placed across the incisions.  The patient was then awoken, extubated and brought to the postanesthesia care unit. There were no immediate complications. Needle, sponge, and instrument counts were correct at the end of the procedure.   ____________________________ Si Raiderhristopher A. Nicolaus Andel, MD cal:aw D: 10/02/2013 01:54:05 ET T: 10/02/2013 10:06:21 ET JOB#: [id number]  cc: Cristal Deerhristopher A. Marialy Urbanczyk, MD, <Dictator> Jarvis NewcomerHRISTOPHER A Starkisha Tullis MD ELECTRONICALLY SIGNED 10/12/2013 9:03

## 2015-02-05 IMAGING — CT CT ABD-PELV W/O CM
2 of 3 series · 17 of 46 positions shown, 19 images · non-contrast
Comparison: None.

CLINICAL DATA: Right flank pain for 2 days.

CT ABDOMEN AND PELVIS WITHOUT CONTRAST
TECHNIQUE: Multidetector CT imaging of the abdomen and pelvis was
performed following the standard protocol without intravenous
contrast.

[Series 2: standard/full over (age)lbs 5.0 · axial · 0.76mm/px · z∈[-582,-177]mm · 14 of 93 slices shown, 16 images]
[im 6/93  soft-tissue]
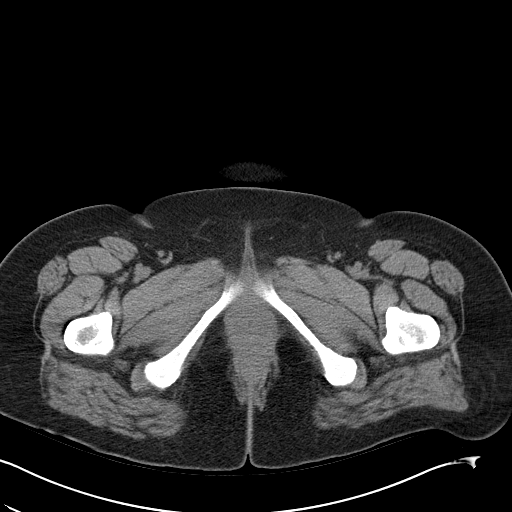
[im 6/93  bone]
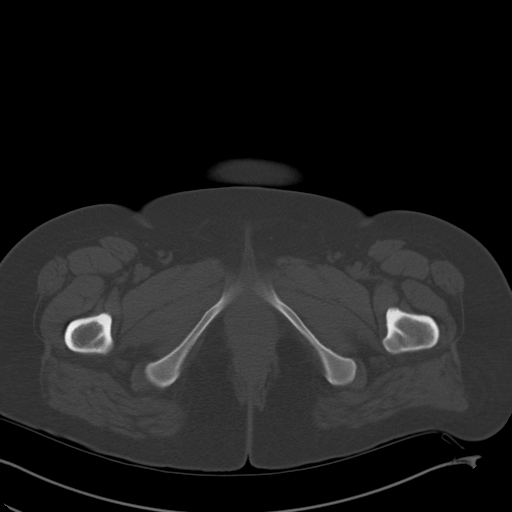
[im 12/93  soft-tissue]
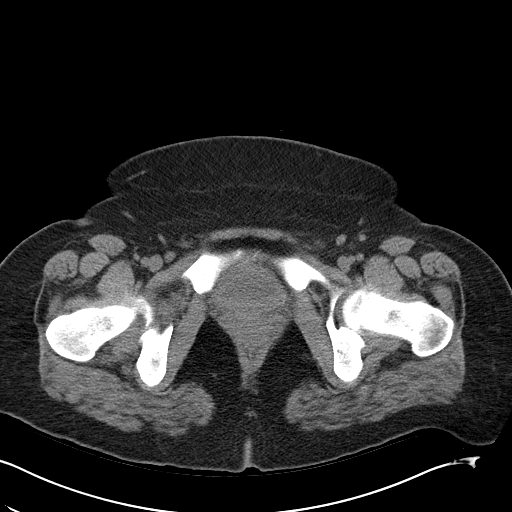
[im 18/93  soft-tissue]
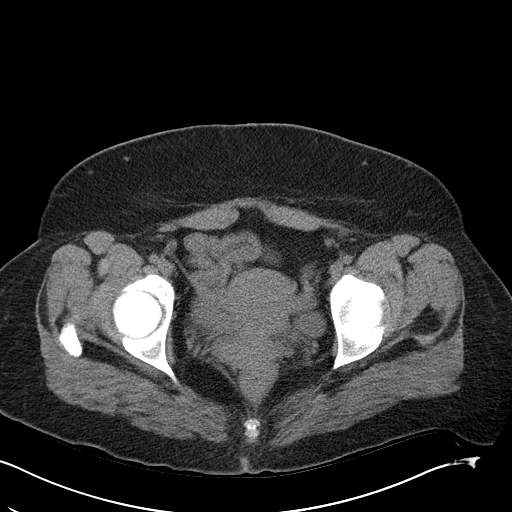
[im 24/93  soft-tissue]
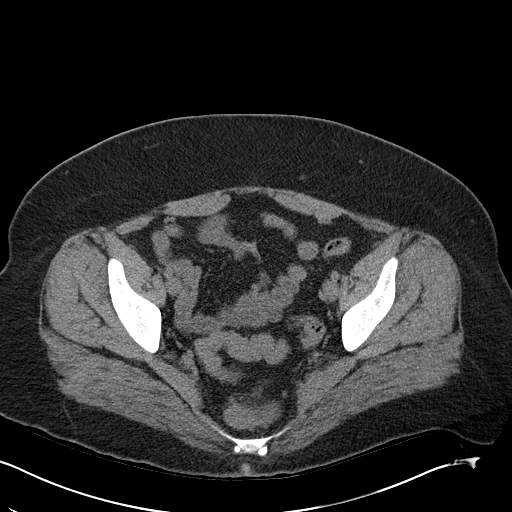
[im 30/93  soft-tissue]
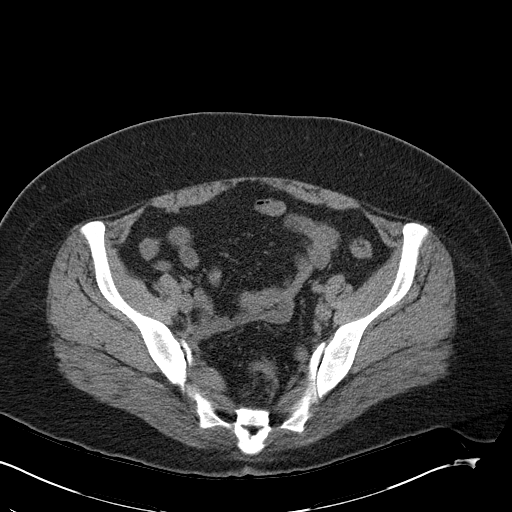
[im 36/93  soft-tissue]
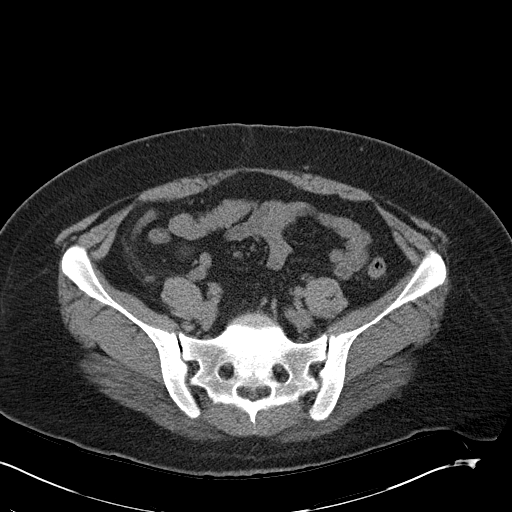
[im 42/93  soft-tissue]
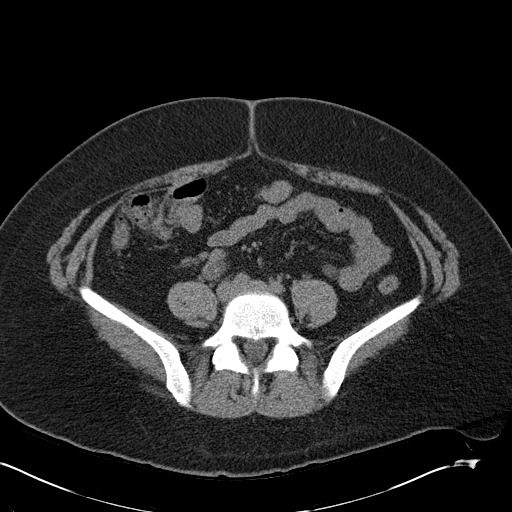
[im 51/93  soft-tissue]
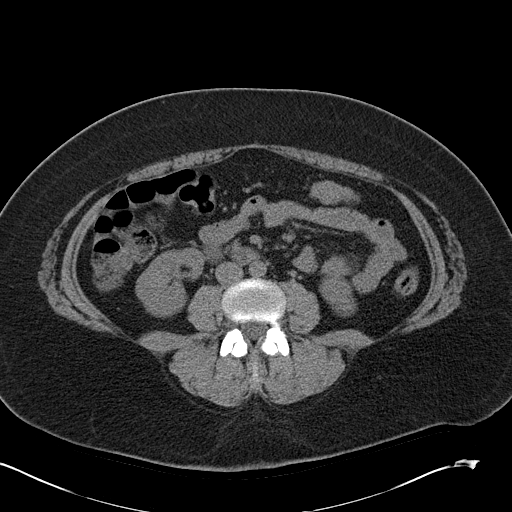
[im 57/93  soft-tissue]
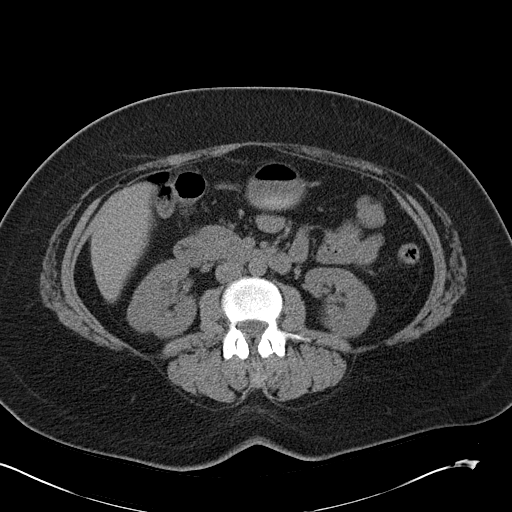
[im 57/93  bone]
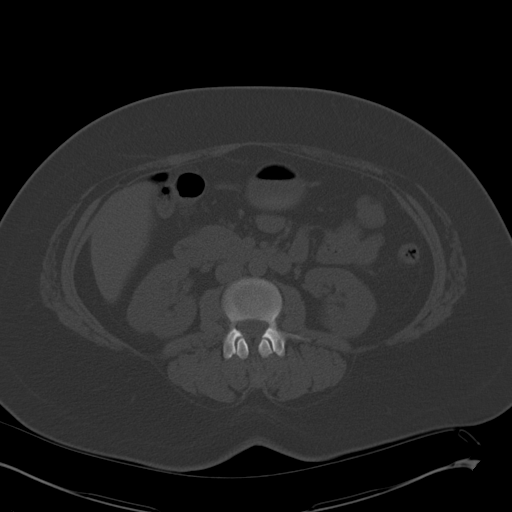
[im 63/93  soft-tissue]
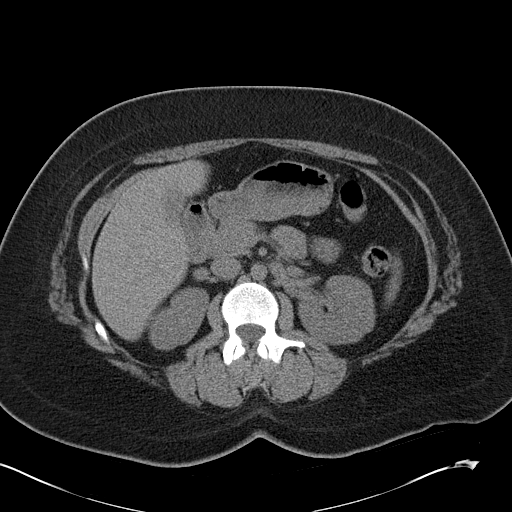
[im 69/93  soft-tissue]
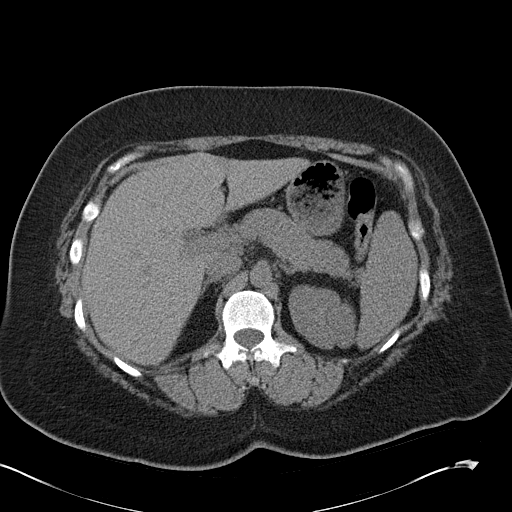
[im 75/93  soft-tissue]
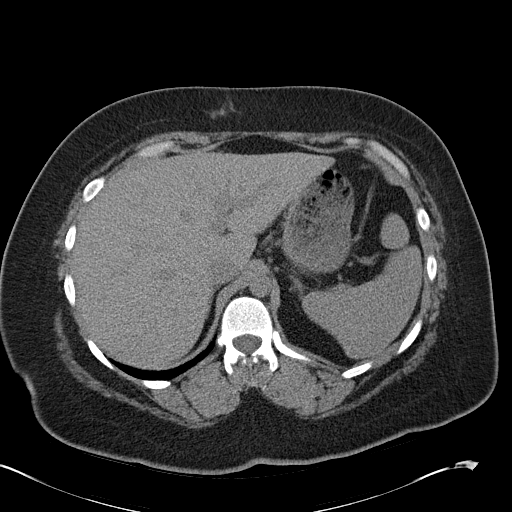
[im 81/93  soft-tissue]
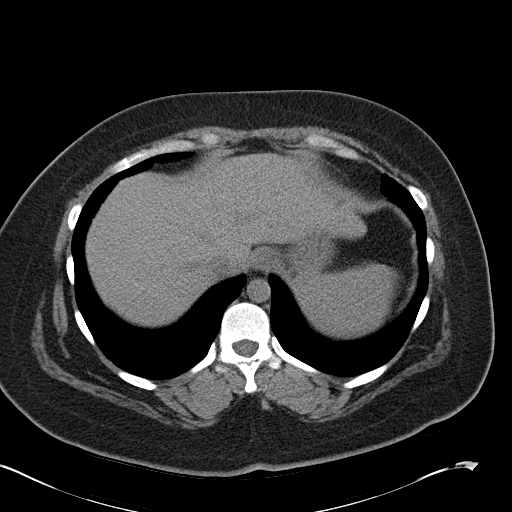
[im 87/93  soft-tissue]
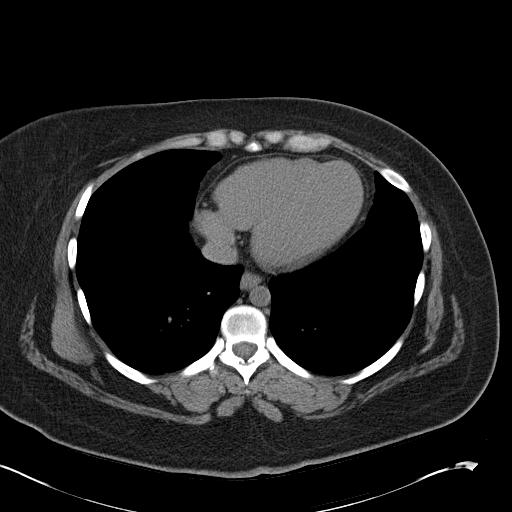

[Series 4: mpr coronal · coronal · 0.84mm/px · 3 of 102 slices shown]
[im 34/102  soft-tissue]
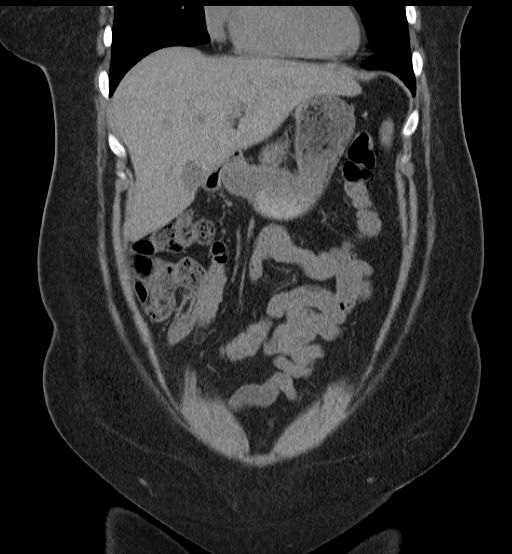
[im 45/102  soft-tissue]
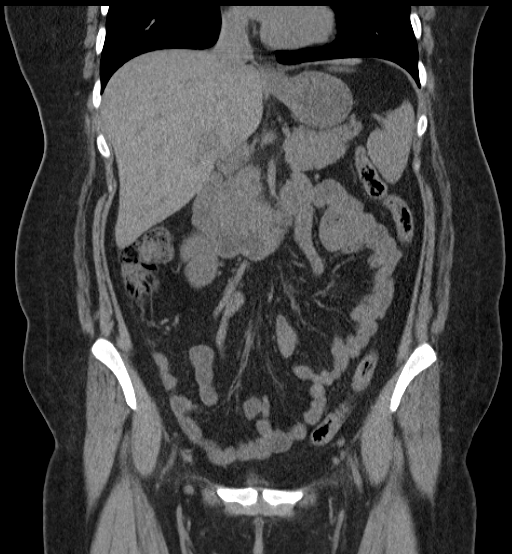
[im 57/102  soft-tissue]
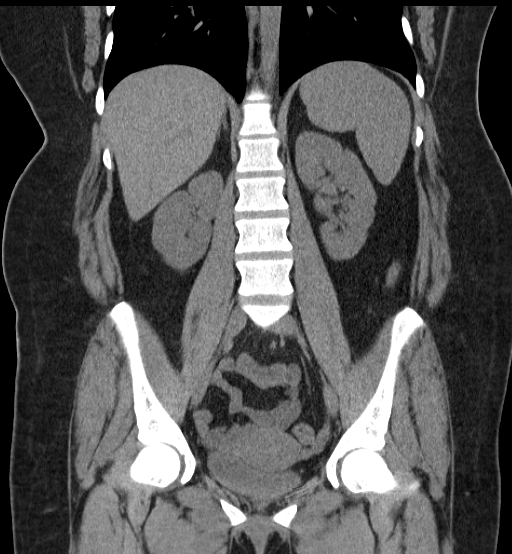

[17 of 46 positions shown; findings below may reference images not displayed]

FINDINGS: Minimal subpleural fibrosis or nodules are demonstrated.
Lung bases are otherwise clear.

The kidneys appear symmetrical in size and shape.  No
pyelocaliectasis or ureterectasis.  No renal, ureteral, or bladder
stones.  No bladder wall thickening.

Surgical absence of the gallbladder.  The unenhanced appearance of
the liver, spleen, pancreas, adrenal glands, abdominal aorta,
inferior vena cava, and retroperitoneal lymph nodes is
unremarkable.  The stomach and small bowel are decompressed.  Stool
filled colon without distension.  No free air or free fluid in the
abdomen.

Pelvis:  Uterus and ovaries are not enlarged.  No free or loculated
pelvic fluid collections.  Decompressed rectosigmoid colon without
inflammatory change.  No significant pelvic lymphadenopathy. The
appendix is somewhat prominent but with diameter at the upper
limits of normal.  Suggestion of minimal infiltration around the
appendix.  Favor this representing normal appendix but early
appendicitis is not entirely excluded.  Correlation with physical
examination laboratory values is recommended.
IMPRESSION: No renal or ureteral stone or obstruction demonstrated. Appendiceal
diameter is upper limits of normal with suggestion of minimal
infiltration.  This is likely normal but early appendicitis is not
excluded. Clinical correlation recommended.

## 2015-05-17 ENCOUNTER — Encounter: Payer: Self-pay | Admitting: Emergency Medicine

## 2015-05-17 ENCOUNTER — Ambulatory Visit
Admission: EM | Admit: 2015-05-17 | Discharge: 2015-05-17 | Disposition: A | Discharge status: Home / Self Care | Payer: Medicaid Other | Attending: Internal Medicine | Admitting: Internal Medicine

## 2015-05-17 DIAGNOSIS — K0889 Other specified disorders of teeth and supporting structures: Secondary | ICD-10-CM

## 2015-05-17 DIAGNOSIS — K088 Other specified disorders of teeth and supporting structures: Secondary | ICD-10-CM

## 2015-05-17 MED ORDER — PENICILLIN V POTASSIUM 500 MG PO TABS: 500.0000 mg | ORAL_TABLET | Freq: Four times a day (QID) | ORAL | Status: DC

## 2015-05-17 NOTE — Discharge Instructions (Signed)
Take medication as prescribed. Rest. Avoid pain triggers. Drink plenty of fluids. Take over-the-counter Tylenol as needed for pain as directed on bottle.  Follow-up with dentist as soon as possible. Return to urgent care for new or worsening concerns.  Dental Pain A tooth ache may be caused by cavities (tooth decay). Cavities expose the nerve of the tooth to air and hot or cold temperatures. It may come from an infection or abscess (also called a boil or furuncle) around your tooth. It is also often caused by dental caries (tooth decay). This causes the pain you are having. DIAGNOSIS  Your caregiver can diagnose this problem by exam. TREATMENT   If caused by an infection, it may be treated with medications which kill germs (antibiotics) and pain medications as prescribed by your caregiver. Take medications as directed.  Only take over-the-counter or prescription medicines for pain, discomfort, or fever as directed by your caregiver.  Whether the tooth ache today is caused by infection or dental disease, you should see your dentist as soon as possible for further care. SEEK MEDICAL CARE IF: The exam and treatment you received today has been provided on an emergency basis only. This is not a substitute for complete medical or dental care. If your problem worsens or new problems (symptoms) appear, and you are unable to meet with your dentist, call or return to this location. SEEK IMMEDIATE MEDICAL CARE IF:   You have a fever.  You develop redness and swelling of your face, jaw, or neck.  You are unable to open your mouth.  You have severe pain uncontrolled by pain medicine. MAKE SURE YOU:   Understand these instructions.  Will watch your condition.  Will get help right away if you are not doing well or get worse. Document Released: 08/10/2005 Document Revised: 11/02/2011 Document Reviewed: 03/28/2008 [facility] Patient Information 2015 [place], [place]. This information is not intended to  replace advice given to you by your health care provider. Make sure you discuss any questions you have with your health care provider.   Marland Kitchen

## 2015-05-17 NOTE — ED Provider Notes (Signed)
[facility] Emergency Department [place] Note ____________________________________________  Time seen: Approximately 6:14 PM  I have reviewed the triage vital signs and the nursing notes.   HISTORY  Chief Complaint Dental Pain    HPI Valerie Robinson is a 29 y.o. female presents for the complaints of right lower dental pain for 7-8 days. Patient reports gradual onset but not resolving. Patient reports continues to eat and drink well without any difficulties. States pain is increased with hot or cold foods. Denies break in the teeth. States current pain is 8 out of 10. States pain occasionally radiates towards her right ear. Denies other pain radiation. Denies difficulty eating or drinking. Denies fever. Denies chest pain, shortness of breath,neck pain,  headache, dizziness or other complaints.   Past Medical History  Diagnosis Date  . Ovarian cyst   . Anxiety   . Bradycardia     pt says was seen at [facility] for bradycardia but was never put on any medication for it.  . Renal disorder     kidney stone    There are no active problems to display for this patient.   Past Surgical History  Procedure Laterality Date  . Cesarean section      x2  . Appendectomy    . Cholecystectomy N/A 03/03/2013    Procedure: LAPAROSCOPIC CHOLECYSTECTOMY;  Surgeon: Dalia Heading, MD;  Location: [facility];  Service: General;  Laterality: N/A;  . Cholecystectomy  2014    Current Outpatient Rx  Name  Route  Sig  Dispense  Refill  .           .           . medroxyPROGESTERone (DEPO-PROVERA) 150 MG/ML injection   Intramuscular   Inject 150 mg into the muscle every 3 (three) months.         .             Allergies Motrin; Naprosyn; and Tramadol  History reviewed. No pertinent family history.  Social History Social History  Substance Use Topics  . Smoking status: Current Every Day Smoker -- 0.50 packs/day for 3 years    Types: Cigarettes  . Smokeless tobacco: Never  Used  . Alcohol Use: None    Review of Systems Constitutional: No fever/chills. Reports continues to eat and drink foods and fluids well.  Eyes: No visual changes. ENT: No sore throat. Positive dental pain Cardiovascular: Denies chest pain. Respiratory: Denies shortness of breath. Gastrointestinal: No abdominal pain.  No nausea, no vomiting.  Genitourinary: Negative for dysuria. Musculoskeletal: Negative for back pain. Skin: Negative for rash. Neurological: Negative for headaches, focal weakness or numbness. 10-point ROS otherwise negative.  ____________________________________________   PHYSICAL EXAM:  VITAL SIGNS: ED Triage Vitals  Enc Vitals Group     BP 05/17/15 1718 115/57 mmHg     Pulse Rate 05/17/15 1718 88     Resp 05/17/15 1718 18     Temp 05/17/15 1718 98.4 F (36.9 C)     Temp Source 05/17/15 1718 Tympanic     SpO2 05/17/15 1718 100 %     Weight 05/17/15 1718 175 lb (79.379 kg)     Height 05/17/15 1718 5' 8.5" (1.74 m)     Head Cir --      Peak Flow --      Pain Score 05/17/15 1721 10     Pain Loc --      Pain Edu? --      Excl. in GC? --  Constitutional: Alert and oriented. Well appearing and in no acute distress. Eyes: Conjunctivae are normal. PERRL. EOMI. Head: Atraumatic.No TMJ TTP.  Ears: Bilateral ears no erythema, normal TMs.  Nose: No congestion/rhinnorhea. Mouth/Throat: Mucous membranes are moist.  Oropharynx non-erythematous. Periodontal Exam   Dental caries noted in #30 and 31 with mod TTP along gum line adjacent to 30 and 31, with gum inflammation, no palpable or visualized dental abscess. Full ROM.  Neck: No stridor.  Hematological/Lymphatic/Immunilogical: No cervical lymphadenopathy. Cardiovascular:   Normal rate, regular rhythm. Grossly normal heart sounds. Good peripheral circulation. Respiratory: Normal respiratory effort.  No retractions. Musculoskeletal: No lower or upper extremity tenderness nor edema.  No joint  effusions. Neurologic:  Normal speech and language. No gross focal neurologic deficits are appreciated. Speech is normal. No gait instability. Skin:  Skin is warm, dry and intact. No rash noted. Psychiatric: Mood and affect are normal. Speech and behavior are normal.  ____________________________________________   LABS (all labs ordered are listed, but only abnormal results are displayed)  Labs Reviewed - No data to display ____________________________________________ __________________________________________   INITIAL IMPRESSION / ASSESSMENT AND PLAN / ED COURSE  Pertinent labs & imaging results that were available during my care of the patient were reviewed by me and considered in my medical decision making (see chart for details).  Patient was advised to see the dentist within 14 days. Also advised to take the antibiotic until finished. Instructed to return to the ER for symptoms that change or worsen or if unable to schedule an appointment.  Very well-appearing patient. No acute distress. Presents for right lower dental pain. Patient with multiple right lower dental caries. No obvious fracture seen. No visible or palpable dental abscess. Discussed with patient need to follow-up with the dentist closely. Patient with dental caries right lower with surrounding gum inflammation.Will treat patient with oral Pen-Vee K when necessary Tylenol and discussed supportive treatments including avoidance of triggers, eating soft food diet and rest. Discussed need to follow-up with dentist as soon as possible,local dental information also given. Discussed follow up with Primary care physician this week as needed. Discussed follow up and return parameters including no resolution or any worsening concerns. Patient verbalized understanding and agreed to plan.   ____________________________________________   FINAL CLINICAL IMPRESSION(S) / ED DIAGNOSES  Final diagnoses:  Pain, dental    Renford Dills, NP 05/17/15 1821

## 2015-05-17 NOTE — ED Notes (Signed)
Pt with dental pain x 8 days

## 2015-06-15 ENCOUNTER — Emergency Department (HOSPITAL_COMMUNITY)
Admission: EM | Admit: 2015-06-15 | Discharge: 2015-06-16 | Disposition: A | Discharge status: Home / Self Care | Payer: Medicaid Other | Attending: Emergency Medicine | Admitting: Emergency Medicine

## 2015-06-15 ENCOUNTER — Emergency Department (HOSPITAL_COMMUNITY): Payer: Medicaid Other

## 2015-06-15 ENCOUNTER — Encounter (HOSPITAL_COMMUNITY): Payer: Self-pay | Admitting: *Deleted

## 2015-06-15 DIAGNOSIS — M545 Low back pain, unspecified: Secondary | ICD-10-CM

## 2015-06-15 DIAGNOSIS — S3992XA Unspecified injury of lower back, initial encounter: Secondary | ICD-10-CM | POA: Diagnosis not present

## 2015-06-15 DIAGNOSIS — Z79899 Other long term (current) drug therapy: Secondary | ICD-10-CM | POA: Diagnosis not present

## 2015-06-15 DIAGNOSIS — S0083XA Contusion of other part of head, initial encounter: Secondary | ICD-10-CM | POA: Diagnosis not present

## 2015-06-15 DIAGNOSIS — F419 Anxiety disorder, unspecified: Secondary | ICD-10-CM | POA: Insufficient documentation

## 2015-06-15 DIAGNOSIS — Z3202 Encounter for pregnancy test, result negative: Secondary | ICD-10-CM | POA: Insufficient documentation

## 2015-06-15 DIAGNOSIS — Z8742 Personal history of other diseases of the female genital tract: Secondary | ICD-10-CM | POA: Insufficient documentation

## 2015-06-15 DIAGNOSIS — Y9389 Activity, other specified: Secondary | ICD-10-CM | POA: Diagnosis not present

## 2015-06-15 DIAGNOSIS — Y92009 Unspecified place in unspecified non-institutional (private) residence as the place of occurrence of the external cause: Secondary | ICD-10-CM | POA: Diagnosis not present

## 2015-06-15 DIAGNOSIS — F1310 Sedative, hypnotic or anxiolytic abuse, uncomplicated: Secondary | ICD-10-CM | POA: Insufficient documentation

## 2015-06-15 DIAGNOSIS — S199XXA Unspecified injury of neck, initial encounter: Secondary | ICD-10-CM | POA: Diagnosis not present

## 2015-06-15 DIAGNOSIS — S0990XA Unspecified injury of head, initial encounter: Secondary | ICD-10-CM | POA: Diagnosis present

## 2015-06-15 DIAGNOSIS — Z720 Tobacco use: Secondary | ICD-10-CM | POA: Diagnosis not present

## 2015-06-15 DIAGNOSIS — Y998 Other external cause status: Secondary | ICD-10-CM | POA: Insufficient documentation

## 2015-06-15 DIAGNOSIS — Z792 Long term (current) use of antibiotics: Secondary | ICD-10-CM | POA: Insufficient documentation

## 2015-06-15 DIAGNOSIS — Z87442 Personal history of urinary calculi: Secondary | ICD-10-CM | POA: Diagnosis not present

## 2015-06-15 LAB — RAPID URINE DRUG SCREEN, HOSP PERFORMED
AMPHETAMINES: NOT DETECTED
BARBITURATES: NOT DETECTED
Benzodiazepines: POSITIVE — AB
COCAINE: NOT DETECTED
OPIATES: NOT DETECTED
Tetrahydrocannabinol: NOT DETECTED

## 2015-06-15 LAB — POC URINE PREG, ED: Preg Test, Ur: NEGATIVE

## 2015-06-15 MED ORDER — HYDROCODONE-ACETAMINOPHEN 5-325 MG PO TABS
1.0000 | ORAL_TABLET | Freq: Once | ORAL | Status: AC
  Administered 2015-06-15: 1 via ORAL
  Filled 2015-06-15: qty 1
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
## 2015-06-15 NOTE — ED Notes (Signed)
J. Idol, PA at bedside. 

## 2015-06-16 MED ORDER — ONDANSETRON 8 MG PO TBDP
8.0000 mg | ORAL_TABLET | Freq: Once | ORAL | Status: AC
  Administered 2015-06-16: 8 mg via ORAL
  Filled 2015-06-16: qty 1

## 2015-06-16 NOTE — ED Notes (Signed)
Pt with bizarre behavior. Up and down to bathroom multiple times and was noticed to have something in back of pants when she returned from bathroom and then proceeded to close curtain. Pt confronted with this and states she was just putting away her flip flops in her bag. Pt then went to xray and bags searched by myself and charge RN. Pt found to have a box of hospitalgloves, a container of hospital bleach wipes, numerous hospital band-aids, numerous hospital alcohol pads, and multiple hospital cups used for collecting urine specimens. Security notified and will talk with pt once she returns from XRAY.

## 2015-06-16 NOTE — Discharge Instructions (Signed)
Head Injury, Adult You have a head injury. Headaches and throwing up (vomiting) are common after a head injury. It should be easy to wake up from sleeping. Sometimes you must stay in the hospital. Most problems happen within the first 24 hours. Side effects may occur up to 7-10 days after the injury.  WHAT ARE THE TYPES OF HEAD INJURIES? Head injuries can be as minor as a bump. Some head injuries can be more severe. More severe head injuries include:  A jarring injury to the brain (concussion).  A bruise of the brain (contusion). This mean there is bleeding in the brain that can cause swelling.  A cracked skull (skull fracture).  Bleeding in the brain that collects, clots, and forms a bump (hematoma). WHEN SHOULD I GET HELP RIGHT AWAY?   You are confused or sleepy.  You cannot be woken up.  You feel sick to your stomach (nauseous) or keep throwing up (vomiting).  Your dizziness or unsteadiness is getting worse.  You have very bad, lasting headaches that are not helped by medicine. Take medicines only as told by your doctor.  You cannot use your arms or legs like normal.  You cannot walk.  You notice changes in the black spots in the center of the colored part of your eye (pupil).  You have clear or bloody fluid coming from your nose or ears.  You have trouble seeing. During the next 24 hours after the injury, you must stay with someone who can watch you. This person should get help right away (call 911 in the [place] if you start to shake and are not able to control it (have seizures), you pass out, or you are unable to wake up. HOW CAN I PREVENT A HEAD INJURY IN THE FUTURE?  Wear seat belts.  Wear a helmet while bike riding and playing sports like football.  Stay away from dangerous activities around the house. WHEN CAN I RETURN TO NORMAL ACTIVITIES AND ATHLETICS? See your doctor before doing these activities. You should not do normal activities or play contact sports until 1  week after the following symptoms have stopped:  Headache that does not go away.  Dizziness.  Poor attention.  Confusion.  Memory problems.  Sickness to your stomach or throwing up.  Tiredness.  Fussiness.  Bothered by bright lights or loud noises.  Anxiousness or depression.  Restless sleep. MAKE SURE YOU:   Understand these instructions.  Will watch your condition.  Will get help right away if you are not doing well or get worse.   This information is not intended to replace advice given to you by your health care provider. Make sure you discuss any questions you have with your health care provider.   Document Released: 07/23/2008 Document Revised: 08/31/2014 Document Reviewed: 04/17/2013 Elsevier Interactive Patient Education 2016 [employer]  Musculoskeletal Pain Musculoskeletal pain is muscle and boney aches and pains. These pains can occur in any part of the body. Your caregiver may treat you without knowing the cause of the pain. They may treat you if blood or urine tests, X-rays, and other tests were normal.  CAUSES There is often not a definite cause or reason for these pains. These pains may be caused by a type of germ (virus). The discomfort may also come from overuse. Overuse includes working out too hard when your body is not fit. Boney aches also come from weather changes. Bone is sensitive to atmospheric pressure changes. HOME CARE INSTRUCTIONS   Ask  when your test results will be ready. Make sure you get your test results.  Only take over-the-counter or prescription medicines for pain, discomfort, or fever as directed by your caregiver. If you were given medications for your condition, do not drive, operate machinery or power tools, or sign legal documents for 24 hours. Do not drink alcohol. Do not take sleeping pills or other medications that may interfere with treatment.  Continue all activities unless the activities cause more pain. When the pain  lessens, slowly resume normal activities. Gradually increase the intensity and duration of the activities or exercise.  During periods of severe pain, bed rest may be helpful. Lay or sit in any position that is comfortable.  Putting ice on the injured area.  Put ice in a bag.  Place a towel between your skin and the bag.  Leave the ice on for 15 to 20 minutes, 3 to 4 times a day.  Follow up with your caregiver for continued problems and no reason can be found for the pain. If the pain becomes worse or does not go away, it may be necessary to repeat tests or do additional testing. Your caregiver may need to look further for a possible cause. SEEK IMMEDIATE MEDICAL CARE IF:  You have pain that is getting worse and is not relieved by medications.  You develop chest pain that is associated with shortness or breath, sweating, feeling sick to your stomach (nauseous), or throw up (vomit).  Your pain becomes localized to the abdomen.  You develop any new symptoms that seem different or that concern you. MAKE SURE YOU:   Understand these instructions.  Will watch your condition.  Will get help right away if you are not doing well or get worse.   This information is not intended to replace advice given to you by your health care provider. Make sure you discuss any questions you have with your health care provider.   Document Released: 08/10/2005 Document Revised: 11/02/2011 Document Reviewed: 04/14/2013 Elsevier Interactive Patient Education [employer]   Your xrays and CT scans tonight are negative for any acute injury.  You may take tylenol if needed for continued headache or body aches.  Ice applied to your forehead and any other areas of contusions will also help with pain and swelling.  See your doctor for a recheck if your symptoms are not improving over the next week.
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
## 2015-07-05 ENCOUNTER — Emergency Department (HOSPITAL_COMMUNITY)
Admission: EM | Admit: 2015-07-05 | Discharge: 2015-07-05 | Disposition: A | Discharge status: Home / Self Care | Payer: Medicaid Other | Attending: Emergency Medicine | Admitting: Emergency Medicine

## 2015-07-05 ENCOUNTER — Encounter (HOSPITAL_COMMUNITY): Payer: Self-pay

## 2015-07-05 DIAGNOSIS — F419 Anxiety disorder, unspecified: Secondary | ICD-10-CM | POA: Insufficient documentation

## 2015-07-05 DIAGNOSIS — Z8742 Personal history of other diseases of the female genital tract: Secondary | ICD-10-CM | POA: Insufficient documentation

## 2015-07-05 DIAGNOSIS — Z3202 Encounter for pregnancy test, result negative: Secondary | ICD-10-CM | POA: Insufficient documentation

## 2015-07-05 DIAGNOSIS — Y998 Other external cause status: Secondary | ICD-10-CM | POA: Insufficient documentation

## 2015-07-05 DIAGNOSIS — S8991XA Unspecified injury of right lower leg, initial encounter: Secondary | ICD-10-CM | POA: Insufficient documentation

## 2015-07-05 DIAGNOSIS — Y9289 Other specified places as the place of occurrence of the external cause: Secondary | ICD-10-CM | POA: Insufficient documentation

## 2015-07-05 DIAGNOSIS — Z792 Long term (current) use of antibiotics: Secondary | ICD-10-CM | POA: Insufficient documentation

## 2015-07-05 DIAGNOSIS — Z720 Tobacco use: Secondary | ICD-10-CM | POA: Insufficient documentation

## 2015-07-05 DIAGNOSIS — S3992XA Unspecified injury of lower back, initial encounter: Secondary | ICD-10-CM | POA: Insufficient documentation

## 2015-07-05 DIAGNOSIS — Y9389 Activity, other specified: Secondary | ICD-10-CM | POA: Insufficient documentation

## 2015-07-05 DIAGNOSIS — Z87442 Personal history of urinary calculi: Secondary | ICD-10-CM | POA: Insufficient documentation

## 2015-07-05 DIAGNOSIS — S8992XA Unspecified injury of left lower leg, initial encounter: Secondary | ICD-10-CM | POA: Insufficient documentation

## 2015-07-05 DIAGNOSIS — S0993XA Unspecified injury of face, initial encounter: Secondary | ICD-10-CM | POA: Insufficient documentation

## 2015-07-05 DIAGNOSIS — S3991XA Unspecified injury of abdomen, initial encounter: Secondary | ICD-10-CM | POA: Insufficient documentation

## 2015-07-05 LAB — POC URINE PREG, ED: Preg Test, Ur: NEGATIVE

## 2015-07-05 MED ORDER — ONDANSETRON 4 MG PO TBDP
8.0000 mg | ORAL_TABLET | Freq: Once | ORAL | Status: AC
  Administered 2015-07-05: 8 mg via ORAL
  Filled 2015-07-05: qty 2

## 2015-07-05 MED ORDER — ACETAMINOPHEN 500 MG PO TABS
1000.0000 mg | ORAL_TABLET | Freq: Once | ORAL | Status: AC
  Administered 2015-07-05: 1000 mg via ORAL
  Filled 2015-07-05: qty 2

## 2015-07-05 MED ORDER — ONDANSETRON 8 MG PO TBDP: 8.0000 mg | ORAL_TABLET | Freq: Three times a day (TID) | ORAL | Status: DC | PRN
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
## 2015-07-05 NOTE — ED Notes (Signed)
[employer] speaking with pt.  
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
## 2015-07-05 NOTE — ED Notes (Signed)
Upon entering room, pt rifling through drawers taking out multiple stacks of items. Pt asking for items that are in her hand. Pt stating "I've been taking tylenol all fucking day and it's not helping."
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
## 2015-07-05 NOTE — ED Notes (Signed)
Pt reports tylenol is not helping. MD to be made aware.

## 2015-07-16 ENCOUNTER — Encounter (HOSPITAL_COMMUNITY): Payer: Self-pay | Admitting: Emergency Medicine

## 2015-07-16 ENCOUNTER — Emergency Department (HOSPITAL_COMMUNITY)
Admission: EM | Admit: 2015-07-16 | Discharge: 2015-07-16 | Disposition: A | Discharge status: Home / Self Care | Payer: Medicaid Other | Attending: Emergency Medicine | Admitting: Emergency Medicine

## 2015-07-16 ENCOUNTER — Emergency Department (HOSPITAL_COMMUNITY)
Admission: EM | Admit: 2015-07-16 | Discharge: 2015-07-16 | Disposition: A | Discharge status: Home / Self Care | Payer: No Typology Code available for payment source

## 2015-07-16 DIAGNOSIS — Z792 Long term (current) use of antibiotics: Secondary | ICD-10-CM | POA: Insufficient documentation

## 2015-07-16 DIAGNOSIS — Z87442 Personal history of urinary calculi: Secondary | ICD-10-CM | POA: Insufficient documentation

## 2015-07-16 DIAGNOSIS — K029 Dental caries, unspecified: Secondary | ICD-10-CM | POA: Insufficient documentation

## 2015-07-16 DIAGNOSIS — F419 Anxiety disorder, unspecified: Secondary | ICD-10-CM | POA: Insufficient documentation

## 2015-07-16 DIAGNOSIS — K0889 Other specified disorders of teeth and supporting structures: Secondary | ICD-10-CM | POA: Insufficient documentation

## 2015-07-16 DIAGNOSIS — Z8742 Personal history of other diseases of the female genital tract: Secondary | ICD-10-CM | POA: Insufficient documentation

## 2015-07-16 DIAGNOSIS — F17210 Nicotine dependence, cigarettes, uncomplicated: Secondary | ICD-10-CM | POA: Insufficient documentation

## 2015-07-16 MED ORDER — AMOXICILLIN 250 MG PO CAPS
500.0000 mg | ORAL_CAPSULE | Freq: Once | ORAL | Status: AC
  Administered 2015-07-16: 500 mg via ORAL
  Filled 2015-07-16: qty 2

## 2015-07-16 MED ORDER — AMOXICILLIN 500 MG PO CAPS: 500.0000 mg | ORAL_CAPSULE | Freq: Three times a day (TID) | ORAL | Status: DC

## 2015-07-16 MED ORDER — ACETAMINOPHEN 500 MG PO TABS
1000.0000 mg | ORAL_TABLET | Freq: Once | ORAL | Status: AC
  Administered 2015-07-16: 1000 mg via ORAL
  Filled 2015-07-16: qty 2
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
## 2015-07-16 NOTE — ED Provider Notes (Signed)
CSN: [id number]     Arrival date & time 07/16/15  1344 History   First MD Initiated Contact with Patient 07/16/15 1519     Chief Complaint  Patient presents with  . Dental Pain     (Consider location/radiation/quality/duration/timing/severity/associated sxs/prior Treatment) Patient is a 29 y.o. female presenting with tooth pain. The history is provided by the patient.  Dental Pain Location:  Upper Quality:  Aching and shooting Severity:  Moderate Onset quality:  Gradual Duration:  2 days Timing:  Intermittent Progression:  Worsening Context: dental caries   Context: not abscess and not recent dental surgery   Relieved by:  Nothing Worsened by:  Hot food/drink and cold food/drink Associated symptoms: gum swelling   Associated symptoms: no drooling, no fever and no trismus   Risk factors: lack of dental care and smoking   Risk factors: no diabetes     Past Medical History  Diagnosis Date  . Ovarian cyst   . Anxiety   . Bradycardia     pt says [facility] but was never put on any medication for it.  . Renal disorder     kidney stone   Past Surgical History  Procedure Laterality Date  . Cesarean section      x2  . Appendectomy    . Cholecystectomy N/A 03/03/2013    Procedure: LAPAROSCOPIC CHOLECYSTECTOMY;  Surgeon: Dalia HeadingMark A Jenkins, MD;  Location: [facility];  Service: General;  Laterality: N/A;  . Cholecystectomy  2014   History reviewed. No pertinent family history. Social History  Substance Use Topics  . Smoking status: Current Every Day Smoker -- 0.50 packs/day for 3 years    Types: Cigarettes  . Smokeless tobacco: Never Used  . Alcohol Use: No   OB History    Gravida Para Term Preterm AB TAB SAB Ectopic Multiple Living   2 2 2       2      Review of Systems  Constitutional: Negative for fever.  HENT: Positive for dental problem. Negative for drooling.   Psychiatric/Behavioral: The patient is nervous/anxious.   All other systems reviewed  and are negative.     Allergies  Motrin; Naprosyn; and Tramadol  Home Medications   Prior to Admission medications   Medication Sig Start Date End Date Taking? Authorizing Provider  Pseudoephedrine-Ibuprofen (ADVIL COLD & SINUS LIQUI-GELS PO) Take 2 capsules by mouth 2 (two) times daily as needed (pain).   Yes Historical Provider, MD  amoxicillin (AMOXIL) 500 MG capsule Take 1 capsule (500 mg total) by mouth 3 (three) times daily. 07/16/15   Ivery QualeHobson Kenza Munar, PA-C  diazepam (VALIUM) 5 MG tablet Take 1 tablet (5 mg total) by mouth 2 (two) times daily. Patient not taking: Reported on 07/16/2015 09/25/13   Irish EldersKelly Walker, NP  ondansetron (ZOFRAN ODT) 8 MG disintegrating tablet Take 1 tablet (8 mg total) by mouth every 8 (eight) hours as needed for nausea or vomiting. Patient not taking: Reported on 07/16/2015 07/05/15   Azalia BilisKevin Campos, MD   BP 99/63 mmHg  Temp(Src) 98 F (36.7 C) (Oral)  Resp 22  Ht 5\' 6"  (1.676 m)  Wt 74.844 kg  BMI 26.64 kg/m2  SpO2 100%  LMP 07/16/2015 Physical Exam  Constitutional: She is oriented to person, place, and time. She appears well-developed and well-nourished.  Non-toxic appearance.  Patient difficult to arouse, but arousable. Chaperone present during the examination.  HENT:  Head: Normocephalic.  Right Ear: Tympanic membrane and external ear normal.  Left  Ear: Tympanic membrane and external ear normal.  There is mild swelling of the gum of the left upper jaw. There is a cavity on the first molar. There is tenderness to percussion of the left upper molars. The airway is patent. There is no swelling under the tongue.  Mild left facial tenderness. No significant swelling, no loss of facial landmarks. No trismus noted.  Eyes: EOM and lids are normal. Pupils are equal, round, and reactive to light.  Neck: Normal range of motion. Neck supple. Carotid bruit is not present.  Cardiovascular: Normal rate, regular rhythm, normal heart sounds, intact distal pulses and  normal pulses.   Pulmonary/Chest: Breath sounds normal. No respiratory distress.  Abdominal: Soft. Bowel sounds are normal. There is no tenderness. There is no guarding.  Musculoskeletal: Normal range of motion.  Lymphadenopathy:       Head (right side): No submandibular adenopathy present.       Head (left side): No submandibular adenopathy present.    She has no cervical adenopathy.  Neurological: She is alert and oriented to person, place, and time. She has normal strength. No cranial nerve deficit or sensory deficit.  Skin: Skin is warm and dry.  Psychiatric: She has a normal mood and affect. Her speech is normal.  Nursing note and vitals reviewed.   ED Course  Procedures (including critical care time) Labs Review Labs Reviewed - No data to display  Imaging Review No results found. I have personally reviewed and evaluated these images and lab results as part of my medical decision-making.   EKG Interpretation None      MDM  Vital signs reviewed. The patient has pain to percussion of the left upper molars. No visible abscess appreciated. No evidence for Ludwig's Angina. Patient is treated with Amoxil 3 times daily and tylenol every 4 hours. Patient strongly encouraged to see a dentist as sone as possible.    Final diagnoses:  Pain, dental    **I have reviewed nursing notes, vital signs, and all appropriate lab and imaging results for this patient.Ivery Quale, PA-C 07/16/15 1540  Samuel Jester, DO 07/18/15 2101

## 2015-07-16 NOTE — ED Notes (Signed)
Pt called again for Triage, no answer. 

## 2015-07-16 NOTE — ED Notes (Signed)
Pt called for Triage, no answer. 

## 2015-07-16 NOTE — Discharge Instructions (Signed)
It is important that you see a dentist as soon as possible. Use tylenol every 4 hours, and amoxil three times daily.

## 2015-07-16 NOTE — ED Notes (Signed)
Pt called second time for Triage.

## 2015-07-16 NOTE — ED Notes (Signed)
Pt reports R upper dental pain that started 2 days ago.

## 2015-07-16 NOTE — ED Notes (Signed)
Instructed pt to take all of antibiotics as prescribed. 

## 2015-09-03 ENCOUNTER — Encounter (HOSPITAL_COMMUNITY): Payer: Self-pay | Admitting: Emergency Medicine

## 2015-09-03 ENCOUNTER — Emergency Department (HOSPITAL_COMMUNITY)
Admission: EM | Admit: 2015-09-03 | Discharge: 2015-09-03 | Disposition: A | Discharge status: Home / Self Care | Payer: Medicaid Other | Attending: Emergency Medicine | Admitting: Emergency Medicine

## 2015-09-03 DIAGNOSIS — Z8742 Personal history of other diseases of the female genital tract: Secondary | ICD-10-CM | POA: Insufficient documentation

## 2015-09-03 DIAGNOSIS — R112 Nausea with vomiting, unspecified: Secondary | ICD-10-CM | POA: Insufficient documentation

## 2015-09-03 DIAGNOSIS — Z87442 Personal history of urinary calculi: Secondary | ICD-10-CM | POA: Insufficient documentation

## 2015-09-03 DIAGNOSIS — F17210 Nicotine dependence, cigarettes, uncomplicated: Secondary | ICD-10-CM | POA: Insufficient documentation

## 2015-09-03 DIAGNOSIS — Z8659 Personal history of other mental and behavioral disorders: Secondary | ICD-10-CM | POA: Insufficient documentation

## 2015-09-03 DIAGNOSIS — J069 Acute upper respiratory infection, unspecified: Secondary | ICD-10-CM | POA: Insufficient documentation

## 2015-09-03 MED ORDER — GUAIFENESIN-CODEINE 100-10 MG/5ML PO SYRP: 10.0000 mL | ORAL_SOLUTION | Freq: Three times a day (TID) | ORAL | Status: DC | PRN

## 2015-09-03 MED ORDER — HYDROCOD POLST-CPM POLST ER 10-8 MG/5ML PO SUER
5.0000 mL | Freq: Once | ORAL | Status: AC
  Administered 2015-09-03: 5 mL via ORAL
  Filled 2015-09-03: qty 5

## 2015-09-03 MED ORDER — ONDANSETRON HCL 4 MG PO TABS: 4.0000 mg | ORAL_TABLET | Freq: Four times a day (QID) | ORAL | Status: DC

## 2015-09-03 MED ORDER — ONDANSETRON 8 MG PO TBDP
8.0000 mg | ORAL_TABLET | Freq: Once | ORAL | Status: AC
  Administered 2015-09-03: 8 mg via ORAL
  Filled 2015-09-03: qty 1

## 2015-09-03 NOTE — ED Notes (Signed)
Pt states that she has a bad sore throat, congestion, and body aches.  Vomited several times today.

## 2015-09-03 NOTE — ED Notes (Signed)
Pt requesting meds for body aches.

## 2015-09-03 NOTE — ED Notes (Signed)
Pt given ginger ale, instructed to sip.

## 2015-09-03 NOTE — Discharge Instructions (Signed)
Nausea and Vomiting Nausea means you feel sick to your stomach. Throwing up (vomiting) is a reflex where stomach contents come out of your mouth. HOME CARE   Take medicine as told by your doctor.  Do not force yourself to eat. However, you do need to drink fluids.  If you feel like eating, eat a normal diet as told by your doctor.  Eat rice, wheat, potatoes, bread, lean meats, yogurt, fruits, and vegetables.  Avoid high-fat foods.  Drink enough fluids to keep your pee (urine) clear or pale yellow.  Ask your doctor how to replace body fluid losses (rehydrate). Signs of body fluid loss (dehydration) include:  Feeling very thirsty.  Dry lips and mouth.  Feeling dizzy.  Dark pee.  Peeing less than normal.  Feeling confused.  Fast breathing or heart rate. GET HELP RIGHT AWAY IF:   You have blood in your throw up.  You have black or bloody poop (stool).  You have a bad headache or stiff neck.  You feel confused.  You have bad belly (abdominal) pain.  You have chest pain or trouble breathing.  You do not pee at least once every 8 hours.  You have cold, clammy skin.  You keep throwing up after 24 to 48 hours.  You have a fever. MAKE SURE YOU:   Understand these instructions.  Will watch your condition.  Will get help right away if you are not doing well or get worse.   This information is not intended to replace advice given to you by your health care provider. Make sure you discuss any questions you have with your health care provider.   Document Released: 01/27/2008 Document Revised: 11/02/2011 Document Reviewed: 01/09/2011 Elsevier Interactive Patient Education 2016 Elsevier Inc.  Viral Infections A virus is a type of germ. Viruses can cause:  Minor sore throats.  Aches and pains.  Headaches.  Runny nose.  Rashes.  Watery eyes.  Tiredness.  Coughs.  Loss of appetite.  Feeling sick to your stomach (nausea).  Throwing up  (vomiting).  Watery poop (diarrhea). HOME CARE   Only take medicines as told by your doctor.  Drink enough water and fluids to keep your pee (urine) clear or pale yellow. Sports drinks are a good choice.  Get plenty of rest and eat healthy. Soups and broths with crackers or rice are fine. GET HELP RIGHT AWAY IF:   You have a very bad headache.  You have shortness of breath.  You have chest pain or neck pain.  You have an unusual rash.  You cannot stop throwing up.  You have watery poop that does not stop.  You cannot keep fluids down.  You or your child has a temperature by mouth above 102 F (38.9 C), not controlled by medicine.  Your baby is older than 3 months with a rectal temperature of 102 F (38.9 C) or higher.  Your baby is 413 months old or younger with a rectal temperature of 100.4 F (38 C) or higher. MAKE SURE YOU:   Understand these instructions.  Will watch this condition.  Will get help right away if you are not doing well or get worse.   This information is not intended to replace advice given to you by your health care provider. Make sure you discuss any questions you have with your health care provider.   Document Released: 07/23/2008 Document Revised: 11/02/2011 Document Reviewed: 01/16/2015 Elsevier Interactive Patient Education [employer] Elsevier Inc.

## 2015-09-03 NOTE — ED Notes (Signed)
Pt reports finished drinking ginger-ale without problem.

## 2015-09-04 NOTE — ED Provider Notes (Signed)
CSN: [id number]     Arrival date & time 09/03/15  1412 History   First MD Initiated Contact with Patient 09/03/15 1428     Chief Complaint  Patient presents with  . Generalized Body Aches  . Sore Throat     (Consider location/radiation/quality/duration/timing/severity/associated sxs/prior Treatment) HPI   Valerie Robinson is a 30 y.o. female who presents to the Emergency Department complaining of sore throat, cough, body aches, nasal congestion for 2-3 days.  She reports cough has been mostly non-productive. She also states that she developed vomiting several hrs prior to arrival, reports several episodes, that at times, were post-tussive.  She states that she has kept down several sips of fluids.  She has taken OTC cold medication with minimal relief.  She denies fever, abdominal pain, diarrhea, dysuria, chest pain and shortness of breath.      Past Medical History  Diagnosis Date  . Ovarian cyst   . Anxiety   . Bradycardia     pt says was seen at [facility] for bradycardia but was never put on any medication for it.  . Renal disorder     kidney stone   Past Surgical History  Procedure Laterality Date  . Cesarean section      x2  . Appendectomy    . Cholecystectomy N/A 03/03/2013    Procedure: LAPAROSCOPIC CHOLECYSTECTOMY;  Surgeon: Dalia Heading, MD;  Location: [facility];  Service: General;  Laterality: N/A;  . Cholecystectomy  2014   History reviewed. No pertinent family history. Social History  Substance Use Topics  . Smoking status: Current Every Day Smoker -- 0.50 packs/day for 3 years    Types: Cigarettes  . Smokeless tobacco: Never Used  . Alcohol Use: No   OB History    Gravida Para Term Preterm AB TAB SAB Ectopic Multiple Living   2 2 2       2      Review of Systems  Constitutional: Negative for fever, chills, activity change and appetite change.  HENT: Positive for congestion, rhinorrhea and sore throat. Negative for facial swelling and trouble swallowing.   Eyes:  Negative for visual disturbance.  Respiratory: Positive for cough. Negative for shortness of breath, wheezing and stridor.   Cardiovascular: Negative for chest pain.  Gastrointestinal: Positive for vomiting. Negative for nausea, abdominal pain and diarrhea.  Genitourinary: Negative for dysuria and difficulty urinating.  Musculoskeletal: Positive for myalgias (generalized body aches). Negative for neck pain and neck stiffness.  Skin: Negative.  Negative for rash.  Neurological: Negative for dizziness, weakness, numbness and headaches.  Hematological: Negative for adenopathy.  Psychiatric/Behavioral: Negative for confusion.  All other systems reviewed and are negative.     Allergies  Motrin; Naprosyn; and Tramadol  Home Medications   Prior to Admission medications   Medication Sig Start Date End Date Taking? Authorizing Provider  Pseudoeph-Doxylamine-DM-APAP (NYQUIL PO) Take 30 mLs by mouth daily as needed (cold).   Yes Historical Provider, MD  guaiFENesin-codeine (ROBITUSSIN AC) 100-10 MG/5ML syrup Take 10 mLs by mouth 3 (three) times daily as needed. 09/03/15   Xiomar Crompton, PA-C  ondansetron (ZOFRAN ODT) 8 MG disintegrating tablet Take 1 tablet (8 mg total) by mouth every 8 (eight) hours as needed for nausea or vomiting. Patient not taking: Reported on 07/16/2015 07/05/15   Azalia Bilis, MD  ondansetron (ZOFRAN) 4 MG tablet Take 1 tablet (4 mg total) by mouth every 6 (six) hours. For nausea or vomiting 09/03/15   Millianna Szymborski, PA-C   BP  110/56 mmHg  Pulse 70  Temp(Src) 97.7 F (36.5 C) (Oral)  Resp 18  Ht 5\' 6"  (1.676 m)  Wt 86.183 kg  BMI 30.68 kg/m2  SpO2 100%  LMP 08/26/2015 Physical Exam  Constitutional: She is oriented to person, place, and time. She appears well-developed and well-nourished. No distress.  HENT:  Head: Normocephalic and atraumatic.  Right Ear: Tympanic membrane and ear canal normal.  Left Ear: Tympanic membrane and ear canal normal.  Nose: Mucosal  edema and rhinorrhea present.  Mouth/Throat: Uvula is midline and mucous membranes are normal. No trismus in the jaw. No uvula swelling. Posterior oropharyngeal erythema present. No oropharyngeal exudate, posterior oropharyngeal edema or tonsillar abscesses.  Eyes: Conjunctivae are normal.  Neck: Normal range of motion and phonation normal. Neck supple. No Brudzinski's sign and no Kernig's sign noted.  Cardiovascular: Normal rate, regular rhythm and normal heart sounds.   Pulmonary/Chest: Effort normal and breath sounds normal. No respiratory distress. She has no wheezes. She has no rales.  Abdominal: Soft. She exhibits no distension. There is no tenderness. There is no rebound and no guarding.  Musculoskeletal: Normal range of motion. She exhibits no edema.  Lymphadenopathy:    She has no cervical adenopathy.  Neurological: She is alert and oriented to person, place, and time. She exhibits normal muscle tone. Coordination normal.  Skin: Skin is warm and dry.  Nursing note and vitals reviewed.   ED Course  Procedures (including critical care time) Labs Review Labs Reviewed - No data to display  Imaging Review No results found. I have personally reviewed and evaluated these images and lab results as part of my medical decision-making.   EKG Interpretation None      MDM   Final diagnoses:  URI (upper respiratory infection)  Non-intractable vomiting with nausea, vomiting of unspecified type    Pt is well appearing, vitals stable.  Mucous membranes are moist.  Abd remains soft, NT.  No concerning sx's for acute abdomen.  Sx's likely related to viral illness. She has drank fluids without further vomiting.  She appears stable for d/c and agrees to close PMD f/u if needed.     Pauline Ausammy Mariesha Venturella, PA-C 09/04/15 0950  Blane OharaJoshua Zavitz, MD 09/07/15 [id number]

## 2015-09-14 ENCOUNTER — Emergency Department (HOSPITAL_COMMUNITY): Payer: Medicaid Other

## 2015-09-14 ENCOUNTER — Emergency Department (HOSPITAL_COMMUNITY)
Admission: EM | Admit: 2015-09-14 | Discharge: 2015-09-14 | Disposition: A | Discharge status: Home / Self Care | Payer: Self-pay | Attending: Emergency Medicine | Admitting: Emergency Medicine

## 2015-09-14 ENCOUNTER — Encounter (HOSPITAL_COMMUNITY): Payer: Self-pay | Admitting: Emergency Medicine

## 2015-09-14 ENCOUNTER — Emergency Department (HOSPITAL_COMMUNITY): Payer: Self-pay

## 2015-09-14 DIAGNOSIS — Z3A01 Less than 8 weeks gestation of pregnancy: Secondary | ICD-10-CM | POA: Insufficient documentation

## 2015-09-14 DIAGNOSIS — Z8659 Personal history of other mental and behavioral disorders: Secondary | ICD-10-CM | POA: Insufficient documentation

## 2015-09-14 DIAGNOSIS — O210 Mild hyperemesis gravidarum: Secondary | ICD-10-CM | POA: Insufficient documentation

## 2015-09-14 DIAGNOSIS — R102 Pelvic and perineal pain: Secondary | ICD-10-CM | POA: Insufficient documentation

## 2015-09-14 DIAGNOSIS — N83201 Unspecified ovarian cyst, right side: Secondary | ICD-10-CM | POA: Insufficient documentation

## 2015-09-14 DIAGNOSIS — O9989 Other specified diseases and conditions complicating pregnancy, childbirth and the puerperium: Secondary | ICD-10-CM | POA: Insufficient documentation

## 2015-09-14 DIAGNOSIS — Z87442 Personal history of urinary calculi: Secondary | ICD-10-CM | POA: Insufficient documentation

## 2015-09-14 DIAGNOSIS — Z3490 Encounter for supervision of normal pregnancy, unspecified, unspecified trimester: Secondary | ICD-10-CM

## 2015-09-14 DIAGNOSIS — R1030 Lower abdominal pain, unspecified: Secondary | ICD-10-CM | POA: Insufficient documentation

## 2015-09-14 DIAGNOSIS — O99351 Diseases of the nervous system complicating pregnancy, first trimester: Secondary | ICD-10-CM | POA: Insufficient documentation

## 2015-09-14 DIAGNOSIS — R112 Nausea with vomiting, unspecified: Secondary | ICD-10-CM

## 2015-09-14 DIAGNOSIS — R6883 Chills (without fever): Secondary | ICD-10-CM | POA: Insufficient documentation

## 2015-09-14 DIAGNOSIS — G8929 Other chronic pain: Secondary | ICD-10-CM | POA: Insufficient documentation

## 2015-09-14 DIAGNOSIS — O99331 Smoking (tobacco) complicating pregnancy, first trimester: Secondary | ICD-10-CM | POA: Insufficient documentation

## 2015-09-14 DIAGNOSIS — F17210 Nicotine dependence, cigarettes, uncomplicated: Secondary | ICD-10-CM | POA: Insufficient documentation

## 2015-09-14 DIAGNOSIS — R197 Diarrhea, unspecified: Secondary | ICD-10-CM | POA: Insufficient documentation

## 2015-09-14 HISTORY — DX: Pelvic and perineal pain unspecified side: R10.20

## 2015-09-14 HISTORY — DX: Other chronic pain: G89.29

## 2015-09-14 HISTORY — DX: Pelvic and perineal pain: R10.2

## 2015-09-14 HISTORY — DX: Malingerer (conscious simulation): Z76.5

## 2015-09-14 LAB — CBC WITH DIFFERENTIAL/PLATELET
BASOS ABS: 0 10*3/uL (ref 0.0–0.1)
BASOS PCT: 0 %
EOS PCT: 2 %
Eosinophils Absolute: 0.2 10*3/uL (ref 0.0–0.7)
HEMATOCRIT: 37.7 % (ref 36.0–46.0)
Hemoglobin: 13.3 g/dL (ref 12.0–15.0)
Lymphocytes Relative: 27 %
Lymphs Abs: 2.5 10*3/uL (ref 0.7–4.0)
MCH: 33.9 pg (ref 26.0–34.0)
MCHC: 35.3 g/dL (ref 30.0–36.0)
MCV: 96.2 fL (ref 78.0–100.0)
MONO ABS: 0.5 10*3/uL (ref 0.1–1.0)
Monocytes Relative: 5 %
NEUTROS ABS: 5.8 10*3/uL (ref 1.7–7.7)
Neutrophils Relative %: 66 %
PLATELETS: 230 10*3/uL (ref 150–400)
RBC: 3.92 MIL/uL (ref 3.87–5.11)
RDW: 11.7 % (ref 11.5–15.5)
WBC: 9 10*3/uL (ref 4.0–10.5)

## 2015-09-14 LAB — COMPREHENSIVE METABOLIC PANEL
ALBUMIN: 3.7 g/dL (ref 3.5–5.0)
ALT: 70 U/L — ABNORMAL HIGH (ref 14–54)
AST: 34 U/L (ref 15–41)
Alkaline Phosphatase: 61 U/L (ref 38–126)
Anion gap: 6 (ref 5–15)
BUN: 7 mg/dL (ref 6–20)
CHLORIDE: 106 mmol/L (ref 101–111)
CO2: 24 mmol/L (ref 22–32)
Calcium: 9.1 mg/dL (ref 8.9–10.3)
Creatinine, Ser: 0.65 mg/dL (ref 0.44–1.00)
GFR calc Af Amer: >60 mL/min (ref >60)
GFR calc non Af Amer: >60 mL/min (ref >60)
GLUCOSE: 89 mg/dL (ref 65–99)
POTASSIUM: 3.9 mmol/L (ref 3.5–5.1)
SODIUM: 136 mmol/L (ref 135–145)
Total Bilirubin: 0.7 mg/dL (ref 0.3–1.2)
Total Protein: 6.8 g/dL (ref 6.5–8.1)

## 2015-09-14 LAB — WET PREP, GENITAL
Clue Cells Wet Prep HPF POC: NONE SEEN
SPERM: NONE SEEN
Trich, Wet Prep: NONE SEEN
Yeast Wet Prep HPF POC: NONE SEEN

## 2015-09-14 LAB — URINALYSIS, ROUTINE W REFLEX MICROSCOPIC
Bilirubin Urine: NEGATIVE
Glucose, UA: NEGATIVE mg/dL
HGB URINE DIPSTICK: NEGATIVE
Ketones, ur: NEGATIVE mg/dL
Leukocytes, UA: NEGATIVE
Nitrite: NEGATIVE
PROTEIN: NEGATIVE mg/dL
Specific Gravity, Urine: <1.005 — ABNORMAL LOW (ref 1.005–1.030)
pH: 7 (ref 5.0–8.0)

## 2015-09-14 LAB — PREGNANCY, URINE: PREG TEST UR: POSITIVE — AB

## 2015-09-14 LAB — HCG, QUANTITATIVE, PREGNANCY: hCG, Beta Chain, Quant, S: 57989 m[IU]/mL — ABNORMAL HIGH (ref <5)

## 2015-09-14 MED ORDER — PRENATAL VITAMINS 0.8 MG PO TABS: 1.0000 | ORAL_TABLET | Freq: Every day | ORAL | Status: DC

## 2015-09-14 MED ORDER — ONDANSETRON HCL 4 MG/2ML IJ SOLN
4.0000 mg | Freq: Once | INTRAMUSCULAR | Status: AC
  Administered 2015-09-14: 4 mg via INTRAVENOUS
  Filled 2015-09-14: qty 2

## 2015-09-14 MED ORDER — PROMETHAZINE HCL 25 MG/ML IJ SOLN
25.0000 mg | Freq: Once | INTRAMUSCULAR | Status: AC
  Administered 2015-09-14: 25 mg via INTRAVENOUS
  Filled 2015-09-14: qty 1

## 2015-09-14 MED ORDER — MORPHINE SULFATE (PF) 4 MG/ML IV SOLN
4.0000 mg | Freq: Once | INTRAVENOUS | Status: AC
  Administered 2015-09-14: 4 mg via INTRAVENOUS
  Filled 2015-09-14: qty 1

## 2015-09-14 MED ORDER — ONDANSETRON 8 MG PO TBDP
8.0000 mg | ORAL_TABLET | Freq: Once | ORAL | Status: AC
  Administered 2015-09-14: 8 mg via ORAL
  Filled 2015-09-14: qty 1

## 2015-09-14 MED ORDER — SODIUM CHLORIDE 0.9 % IV BOLUS (SEPSIS)
1000.0000 mL | Freq: Once | INTRAVENOUS | Status: AC
Start: 2015-09-14 — End: 2015-09-14
  Administered 2015-09-14: 1000 mL via INTRAVENOUS

## 2015-09-14 MED ORDER — DOXYLAMINE-PYRIDOXINE 10-10 MG PO TBEC: 1.0000 | DELAYED_RELEASE_TABLET | Freq: Every evening | ORAL | Status: DC | PRN
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
## 2015-09-14 NOTE — Discharge Instructions (Signed)
Abdominal Pain During Pregnancy Follow up with the [facility] doctor. Return to the ED if you develop new or worsening symptoms. Abdominal pain is common in pregnancy. Most of the time, it does not cause harm. There are many causes of abdominal pain. Some causes are more serious than others. Some of the causes of abdominal pain in pregnancy are easily diagnosed. Occasionally, the diagnosis takes time to understand. Other times, the cause is not determined. Abdominal pain can be a sign that something is very wrong with the pregnancy, or the pain may have nothing to do with the pregnancy at all. For this reason, always tell your health care provider if you have any abdominal discomfort. HOME CARE INSTRUCTIONS  Monitor your abdominal pain for any changes. The following actions may help to alleviate any discomfort you are experiencing:  Do not have sexual intercourse or put anything in your vagina until your symptoms go away completely.  Get plenty of rest until your pain improves.  Drink clear fluids if you feel nauseous. Avoid solid food as long as you are uncomfortable or nauseous.  Only take over-the-counter or prescription medicine as directed by your health care provider.  Keep all follow-up appointments with your health care provider. SEEK IMMEDIATE MEDICAL CARE IF:  You are bleeding, leaking fluid, or passing tissue from the vagina.  You have increasing pain or cramping.  You have persistent vomiting.  You have painful or bloody urination.  You have a fever.  You notice a decrease in your baby's movements.  You have extreme weakness or feel faint.  You have shortness of breath, with or without abdominal pain.  You develop a severe headache with abdominal pain.  You have abnormal vaginal discharge with abdominal pain.  You have persistent diarrhea.  You have abdominal pain that continues even after rest, or gets worse. MAKE SURE YOU:   Understand these instructions.  Will watch  your condition.  Will get help right away if you are not doing well or get worse.   This information is not intended to replace advice given to you by your health care provider. Make sure you discuss any questions you have with your health care provider.   Document Released: 08/10/2005 Document Revised: 05/31/2013 Document Reviewed: 03/09/2013 Elsevier Interactive Patient Education [employer]

## 2015-09-14 NOTE — ED Notes (Signed)
PT request something for nausea and pain.  Informed her that  Nurses dealing with 2 codes and someone will be with her as soon as possible.

## 2015-09-14 NOTE — ED Provider Notes (Signed)
CSN: [id number]     Arrival date & time 09/14/15  0510 History   First MD Initiated Contact with Patient 09/14/15 [phone]     Chief Complaint  Patient presents with  . Abdominal Pain     (Consider location/radiation/quality/duration/timing/severity/associated sxs/prior Treatment) HPI Comments: Pelvic pain with nausea and vomiting for the past 2 days. She denies any fever.  No vaginal bleeding or disharge.  LMP Dec 11.  Pain is similar to when she was told she had ovarian cysts. No dysuria or hematuria. States she cannot keep anything down since yesterday. She is also had a couple episodes of diarrhea. Denies any fever. No sick contacts. No chest pain or shortness of breath. Pain is only when she was diagnosed with ovarian cysts. She is concerned she could be pregnant. She denies any dysuria or hematuria. Previous cholecystectomy and appendectomy.  Patient is a 29 y.o. female presenting with abdominal pain. The history is provided by the patient.  Abdominal Pain Associated symptoms: chills, diarrhea, nausea and vomiting   Associated symptoms: no cough, no dysuria, no fatigue, no fever, no hematuria and no shortness of breath     Past Medical History  Diagnosis Date  . Ovarian cyst   . Anxiety   . Bradycardia     pt says was seen at [facility] for bradycardia but was never put on any medication for it.  . Renal disorder     kidney stone  . Drug-seeking behavior 2015  . Chronic pelvic pain in female    Past Surgical History  Procedure Laterality Date  . Cesarean section      x2  . Appendectomy    . Cholecystectomy N/A 03/03/2013    Procedure: LAPAROSCOPIC CHOLECYSTECTOMY;  Surgeon: Dalia Heading, MD;  Location: [facility];  Service: General;  Laterality: N/A;  . Cholecystectomy  2014   No family history on file. Social History  Substance Use Topics  . Smoking status: Current Every Day Smoker -- 0.50 packs/day for 3 years    Types: Cigarettes  . Smokeless tobacco: Never Used  . Alcohol  Use: No   OB History    Gravida Para Term Preterm AB TAB SAB Ectopic Multiple Living   2 2 2       2      Review of Systems  Constitutional: Positive for chills, activity change and appetite change. Negative for fever and fatigue.  HENT: Negative for congestion.   Respiratory: Negative for cough, chest tightness and shortness of breath.   Gastrointestinal: Positive for nausea, vomiting, abdominal pain and diarrhea.  Genitourinary: Negative for dysuria and hematuria.  Musculoskeletal: Negative for myalgias and arthralgias.  Skin: Negative for wound.  Neurological: Negative for dizziness, weakness and headaches.  A complete 10 system review of systems was obtained and all systems are negative except as noted in the HPI and PMH.      Allergies  Motrin; Naprosyn; and Tramadol  Home Medications   Prior to Admission medications   Medication Sig Start Date End Date Taking? Authorizing Provider  Doxylamine-Pyridoxine (DICLEGIS) 10-10 MG TBEC Take 1 tablet by mouth at bedtime as needed. 09/14/15   Glynn Octave, MD  guaiFENesin-codeine (ROBITUSSIN AC) 100-10 MG/5ML syrup Take 10 mLs by mouth 3 (three) times daily as needed. Patient not taking: Reported on 09/14/2015 09/03/15   Tammy Triplett, PA-C  ondansetron (ZOFRAN ODT) 8 MG disintegrating tablet Take 1 tablet (8 mg total) by mouth every 8 (eight) hours as needed for nausea or vomiting. Patient not taking:  Reported on 07/16/2015 07/05/15   Azalia Bilis, MD  ondansetron (ZOFRAN) 4 MG tablet Take 1 tablet (4 mg total) by mouth every 6 (six) hours. For nausea or vomiting Patient not taking: Reported on 09/14/2015 09/03/15   Tammy Triplett, PA-C  Prenatal Multivit-Min-Fe-FA (PRENATAL VITAMINS) 0.8 MG tablet Take 1 tablet by mouth daily. 09/14/15   Glynn Octave, MD   BP 95/56 mmHg  Pulse 71  Temp(Src) 98.2 F (36.8 C) (Oral)  Resp 16  Ht  (1.676 m)  Wt 220 lb (99.791 kg)  BMI 35.53 kg/m2  SpO2 100%  LMP 08/04/2015 Physical Exam   Constitutional: She is oriented to person, place, and time. She appears well-developed and well-nourished. No distress.  HENT:  Head: Normocephalic and atraumatic.  Mouth/Throat: Oropharynx is clear and moist. No oropharyngeal exudate.  Eyes: Conjunctivae and EOM are normal. Pupils are equal, round, and reactive to light.  Neck: Normal range of motion. Neck supple.  No meningismus.  Cardiovascular: Normal rate, regular rhythm, normal heart sounds and intact distal pulses.   No murmur heard. Pulmonary/Chest: Effort normal and breath sounds normal. No respiratory distress.  Abdominal: Soft. There is tenderness. There is no rebound and no guarding.  Suprapubic tenderness  Genitourinary: Vaginal discharge found.  Chaperone present. Normal external genitalia.  Uterine tenderness without significant CMT. L adnexal tenderness  Musculoskeletal: Normal range of motion. She exhibits no edema or tenderness.  No CVAT  Neurological: She is alert and oriented to person, place, and time. No cranial nerve deficit. She exhibits normal muscle tone. Coordination normal.  No ataxia on finger to nose bilaterally. No pronator drift. 5/5 strength throughout. CN 2-12 intact.Equal grip strength. Sensation intact.   Skin: Skin is warm.  Psychiatric: She has a normal mood and affect. Her behavior is normal.  Nursing note and vitals reviewed.   ED Course  Procedures (including critical care time) Labs Review Labs Reviewed  WET PREP, GENITAL - Abnormal; Notable for the following:    WBC, Wet Prep HPF POC MODERATE (*)    All other components within normal limits  URINALYSIS, ROUTINE W REFLEX MICROSCOPIC (NOT AT [facility]) - Abnormal; Notable for the following:    Specific Gravity, Urine <1.005 (*)    All other components within normal limits  PREGNANCY, URINE - Abnormal; Notable for the following:    Preg Test, Ur POSITIVE (*)    All other components within normal limits  COMPREHENSIVE METABOLIC PANEL - Abnormal;  Notable for the following:    ALT 70 (*)    All other components within normal limits  HCG, QUANTITATIVE, PREGNANCY - Abnormal; Notable for the following:    hCG, Beta Chain, Quant, S [address] (*)    All other components within normal limits  CBC WITH DIFFERENTIAL/PLATELET  GC/CHLAMYDIA PROBE AMP ([place]) NOT AT [facility]    Imaging Review US Ob Comp Less 14 Wks  09/14/2015  CLINICAL DATA:  Pelvic pain for 3 days EXAM: OBSTETRIC <14 WK [place] AND TRANSVAGINAL OB US DOPPLER ULTRASOUND OF OVARIES TECHNIQUE: Both transabdominal and transvaginal ultrasound examinations were performed for complete evaluation of the gestation as well as the maternal uterus, adnexal regions, and pelvic cul-de-sac. Transvaginal technique was performed to assess early pregnancy. Color and duplex Doppler ultrasound was utilized to evaluate blood flow to the ovaries. COMPARISON:  None. FINDINGS: Intrauterine gestational sac: Visualized/normal in shape. Yolk sac:  Present Embryo:  Present Cardiac Activity: Present Heart Rate: 150 bpm CRL:   9  mm   6 w  6 d                  US EDC: 05/03/2016 Subchori[place]oreaonic hemorrhage:  None visualized. Maternal uterus/adnexae: The ovaries are well visualized bilaterally with normal Doppler appearance. A 2 cm cyst is noted within the right ovary. No free pelvic fluid is seen. Pulsed Doppler evaluation of both ovaries demonstrates normal appearing low-resistance arterial and venous waveforms. IMPRESSION: Single live intrauterine gestation at 6 weeks 6 days. No other focal abnormality is seen aside from a 2 cm cyst in the right ovary. Electronically Signed   By: Alcide Clever M.D.   On: 09/14/2015 09:52   US Ob Comp Less 14 Wks  09/14/2015  CLINICAL DATA:  Pelvic pain for 3 days EXAM: OBSTETRIC <14 WK [place] AND TRANSVAGINAL OB US DOPPLER ULTRASOUND OF OVARIES TECHNIQUE: Both transabdominal and transvaginal ultrasound examinations were performed for complete evaluation of the gestation as well as the maternal  uterus, adnexal regions, and pelvic cul-de-sac. Transvaginal technique was performed to assess early pregnancy. Color and duplex Doppler ultrasound was utilized to evaluate blood flow to the ovaries. COMPARISON:  None. FINDINGS: Intrauterine gestational sac: Visualized/normal in shape. Yolk sac:  Present Embryo:  Present Cardiac Activity: Present Heart Rate: 150 bpm CRL:   9  mm   6 w 6 d                  [place] EDC: 05/03/2016 Subchorionic hemorrhage:  None visualized. Maternal uterus/adnexae: The ovaries are well visualized bilaterally with normal Doppler appearance. A 2 cm cyst is noted within the right ovary. No free pelvic fluid is seen. Pulsed Doppler evaluation of both ovaries demonstrates normal appearing low-resistance arterial and venous waveforms. IMPRESSION: Single live intrauterine gestation at 6 weeks 6 days. No other focal abnormality is seen aside from a 2 cm cyst in the right ovary. Electronically Signed   By: Alcide Clever M.D.   On: 09/14/2015 09:52   [place] Art/ven Flow Abd Pelv Doppler  09/14/2015  CLINICAL DATA:  Pelvic pain for 3 days EXAM: OBSTETRIC <14 WK [place] AND TRANSVAGINAL OB US DOPPLER ULTRASOUND OF OVARIES TECHNIQUE: Both transabdominal and transvaginal ultrasound examinations were performed for complete evaluation of the gestation as well as the maternal uterus, adnexal regions, and pelvic cul-de-sac. Transvaginal technique was performed to assess early pregnancy. Color and duplex Doppler ultrasound was utilized to evaluate blood flow to the ovaries. COMPARISON:  None. FINDINGS: Intrauterine gestational sac: Visualized/normal in shape. Yolk sac:  Present Embryo:  Present Cardiac Activity: Present Heart Rate: 150 bpm CRL:   9  mm   6 w 6 d                  [place] EDC: 05/03/2016 Subchorionic hemorrhage:  None visualized. Maternal uterus/adnexae: The ovaries are well visualized bilaterally with normal Doppler appearance. A 2 cm cyst is noted within the right ovary. No free pelvic fluid is seen.  Pulsed Doppler evaluation of both ovaries demonstrates normal appearing low-resistance arterial and venous waveforms. IMPRESSION: Single live intrauterine gestation at 6 weeks 6 days. No other focal abnormality is seen aside from a 2 cm cyst in the right ovary. Electronically Signed   By: Alcide Clever M.D.   On: 09/14/2015 09:52   I have personally reviewed and evaluated these images and lab results as part of my medical decision-making.   EKG Interpretation None      MDM   Final diagnoses:  Pelvic pain in female  Pregnancy  Nausea  and vomiting, vomiting of unspecified type   Pelvic pain with nausea and vomiting for the past 2 days. History of ovarian cysts. Previous cholecystectomy and appendectomy.  HCG positive.  Patient received zofran before pregnancy test resulted per nursing protocol.  Ultrasound shows IUP at [redacted] weeks gestation without complicating features. There is a right ovarian cyst. Patient with no vomiting in the ED. No evidence of ovarian torsion.  Urinalysis is negative. Patient is tolerating by mouth.   Patient informed of her pregnancy and need for OB follow-up. She will be given prenatal vitamins as well as antiemetics for home use. She is tolerating by mouth in the ED without any further vomiting. Return precautions discussed.  Glynn Octave, MD 09/14/15 [phone]

## 2015-09-14 NOTE — ED Notes (Signed)
Pt reports lower abd pain x 2 days, states she has had ovarian cysts before and that it feels the same.

## 2015-09-16 LAB — GC/CHLAMYDIA PROBE AMP (~~LOC~~) NOT AT ARMC
CHLAMYDIA, DNA PROBE: NEGATIVE
NEISSERIA GONORRHEA: NEGATIVE

## 2015-10-27 ENCOUNTER — Encounter (HOSPITAL_COMMUNITY): Payer: Self-pay | Admitting: Emergency Medicine

## 2015-10-27 ENCOUNTER — Emergency Department (HOSPITAL_COMMUNITY)
Admission: EM | Admit: 2015-10-27 | Discharge: 2015-10-27 | Disposition: A | Discharge status: Home / Self Care | Payer: Medicaid Other | Attending: Emergency Medicine | Admitting: Emergency Medicine

## 2015-10-27 DIAGNOSIS — F17210 Nicotine dependence, cigarettes, uncomplicated: Secondary | ICD-10-CM | POA: Insufficient documentation

## 2015-10-27 DIAGNOSIS — J069 Acute upper respiratory infection, unspecified: Secondary | ICD-10-CM

## 2015-10-27 DIAGNOSIS — H9203 Otalgia, bilateral: Secondary | ICD-10-CM | POA: Diagnosis present

## 2015-10-27 DIAGNOSIS — J329 Chronic sinusitis, unspecified: Secondary | ICD-10-CM | POA: Insufficient documentation

## 2015-10-27 NOTE — ED Notes (Signed)
Patient c/o bilateral ear pain. Per patient pain worse on left side. Denies any drainage or fevers. Patient [redacted] weeks pregnant.

## 2015-10-27 NOTE — ED Provider Notes (Signed)
CSN: [id number]     Arrival date & time 10/27/15  1605 History  By signing my name below, I, Ronney LionSuzanne Le, attest that this documentation has been prepared under the direction and in the presence of Ivery QualeHobson Mishayla Sliwinski, PA-C. Electronically Signed: Ronney LionSuzanne Le, ED Scribe. 10/27/2015. 5:43 PM.    Chief Complaint  Patient presents with  . Otalgia   Patient is a 30 y.o. female presenting with ear pain. The history is provided by the patient. No language interpreter was used.  Otalgia Location:  Bilateral Behind ear:  No abnormality Quality:  Sharp Severity:  Moderate Onset quality:  Gradual Duration:  3 days Timing:  Constant Progression:  Worsening Chronicity:  New Relieved by:  None tried Worsened by:  Nothing tried Ineffective treatments:  None tried Associated symptoms: no congestion, no ear discharge, no fever and no sore throat     HPI Comments: Valerie Robinson is a 30 y.o. female who is currently [redacted] weeks pregnant, who presents to the Emergency Department complaining of gradual-onset, constant bilateral otalgia, left greater than right, that started 2-3 days ago. She states that today her pain is radiating into her right neck. She also notes one episode of epistaxis, which has since resolved. Patient denies a history of any ear surgeries. She denies any ear discharge, fever, nasal congestion, or sore throat.  Past Medical History  Diagnosis Date  . Ovarian cyst   . Anxiety   . Bradycardia     [facility] but was never put on any medication for it.  . Renal disorder     kidney stone  . Drug-seeking behavior 2015  . Chronic pelvic pain in female    Past Surgical History  Procedure Laterality Date  . Cesarean section      x2  . Appendectomy    . Cholecystectomy N/A 03/03/2013    Procedure: LAPAROSCOPIC CHOLECYSTECTOMY;  Surgeon: Dalia HeadingMark A Jenkins, MD;  Location: [facility];  Service: General;  Laterality: N/A;  . Cholecystectomy  2014   History reviewed. No  pertinent family history. Social History  Substance Use Topics  . Smoking status: Current Every Day Smoker -- 0.50 packs/day for 3 years    Types: Cigarettes  . Smokeless tobacco: Never Used  . Alcohol Use: No   OB History    Gravida Para Term Preterm AB TAB SAB Ectopic Multiple Living   3 2 2       2      Review of Systems  Constitutional: Negative for fever.  HENT: Positive for ear pain and nosebleeds (one episode, resolved). Negative for congestion, ear discharge and sore throat.    Allergies  Motrin; Naprosyn; and Tramadol  Home Medications   Prior to Admission medications   Medication Sig Start Date End Date Taking? Authorizing Provider  Doxylamine-Pyridoxine (DICLEGIS) 10-10 MG TBEC Take 1 tablet by mouth at bedtime as needed. 09/14/15   Glynn OctaveStephen Rancour, MD  Prenatal Multivit-Min-Fe-FA (PRENATAL VITAMINS) 0.8 MG tablet Take 1 tablet by mouth daily. 09/14/15   Glynn OctaveStephen Rancour, MD   BP 110/53 mmHg  Pulse 79  Temp(Src) 98.3 F (36.8 C) (Oral)  Resp 16  Ht 5\' 6"  (1.676 m)  Wt 215 lb (97.523 kg)  BMI 34.72 kg/m2  SpO2 100%  LMP 08/04/2015 Physical Exam  Constitutional: She is oriented to person, place, and time. She appears well-developed and well-nourished. No distress.  HENT:  Head: Normocephalic and atraumatic.  Right Ear: Hearing, tympanic membrane, external ear and ear  canal normal.  Left Ear: Hearing, tympanic membrane, external ear and ear canal normal.  Mouth/Throat: Uvula is midline. Posterior oropharyngeal erythema present.  Mild redness of the posterior pharynx. Uvula is midline. The external auditory canals are clear bilaterally. TM's are normal. Nasal congestion present.  Eyes: Conjunctivae and EOM are normal.  Neck: Neck supple. No tracheal deviation present.  Cardiovascular: Normal rate.   Pulmonary/Chest: Effort normal. No respiratory distress.  Musculoskeletal: Normal range of motion.  Lymphadenopathy:    She has no cervical adenopathy.   Neurological: She is alert and oriented to person, place, and time.  Skin: Skin is warm and dry.  Psychiatric: She has a normal mood and affect. Her behavior is normal.  Nursing note and vitals reviewed.   ED Course  Procedures (including critical care time)  DIAGNOSTIC STUDIES: Oxygen Saturation is 100% on RA, normal by my interpretation.    COORDINATION OF CARE: 5:32 PM - Suspect ear pain is due to congestion. Discussed treatment plan with pt at bedside which includes Afrin spray every 8 hours, for 5 days only, as well as Tylenol. Also recommended hot showers to allow steam to relieve congestion. Pt verbalized understanding and agreed to plan.   MDM  The exam reveals some mild to moderate tone posterior oropharyngeal erythema. There is increase in nasal congestion. Suspect the ear pain is related to congestion. Patient will be treated with Afrin and Tylenol/ibuprofen. The patient is to return to the emergency department, or see the pediatrician if not improving.    Final diagnoses:  Other sinusitis    **I personally performed the services described in this documentation, which was scribed in my presence. The recorded information has been reviewed and is accurate.*  I have reviewed nursing notes, vital signs, and all appropriate lab and imaging results for this patient.  Ivery Quale, PA-C 10/29/15 1649  Samuel Jester, DO 10/30/15 [phone]

## 2015-10-27 NOTE — Discharge Instructions (Signed)
Please use afrin spray every 8 hours for 5 days only. Use tylenol for pain. Please increase fluids. See Dr Shea Evansunn for additonal evaluation if not improving. Sinusitis, Adult Sinusitis is redness, soreness, and puffiness (inflammation) of the air pockets in the bones of your face (sinuses). The redness, soreness, and puffiness can cause air and mucus to get trapped in your sinuses. This can allow germs to grow and cause an infection.  HOME CARE   Drink enough fluids to keep your pee (urine) clear or pale yellow.  Use a humidifier in your home.  Run a hot shower to create steam in the bathroom. Sit in the bathroom with the door closed. Breathe in the steam 3-4 times a day.  Put a warm, moist washcloth on your face 3-4 times a day, or as told by your doctor.  Use salt water sprays (saline sprays) to wet the thick fluid in your nose. This can help the sinuses drain.  Only take medicine as told by your doctor. GET HELP RIGHT AWAY IF:   Your pain gets worse.  You have very bad headaches.  You are sick to your stomach (nauseous).  You throw up (vomit).  You are very sleepy (drowsy) all the time.  Your face is puffy (swollen).  Your vision changes.  You have a stiff neck.  You have trouble breathing. MAKE SURE YOU:   Understand these instructions.  Will watch your condition.  Will get help right away if you are not doing well or get worse.   This information is not intended to replace advice given to you by your health care provider. Make sure you discuss any questions you have with your health care provider.   Document Released: 01/27/2008 Document Revised: 08/31/2014 Document Reviewed: 03/15/2012 Elsevier Interactive Patient Education [employer]2016 [employer] Inc.

## 2015-10-27 NOTE — ED Notes (Signed)
Pregnant - has seasonal allergies but does not know if she can take OTC meds - 2-3 day history of bilateral ear pain with L greater than right as pain site. Denies sore throat and congestion

## 2016-11-28 ENCOUNTER — Emergency Department (HOSPITAL_COMMUNITY): Payer: Medicaid Other

## 2016-11-28 ENCOUNTER — Encounter (HOSPITAL_COMMUNITY): Payer: Self-pay | Admitting: Emergency Medicine

## 2016-11-28 ENCOUNTER — Emergency Department (HOSPITAL_COMMUNITY)
Admission: EM | Admit: 2016-11-28 | Discharge: 2016-11-28 | Disposition: A | Discharge status: Home / Self Care | Payer: Medicaid Other | Attending: Emergency Medicine | Admitting: Emergency Medicine

## 2016-11-28 DIAGNOSIS — M5440 Lumbago with sciatica, unspecified side: Secondary | ICD-10-CM | POA: Insufficient documentation

## 2016-11-28 DIAGNOSIS — M545 Low back pain: Secondary | ICD-10-CM | POA: Diagnosis present

## 2016-11-28 DIAGNOSIS — F17210 Nicotine dependence, cigarettes, uncomplicated: Secondary | ICD-10-CM | POA: Diagnosis not present

## 2016-11-28 MED ORDER — CYCLOBENZAPRINE HCL 10 MG PO TABS: 10.0000 mg | ORAL_TABLET | Freq: Three times a day (TID) | ORAL | 0 refills | Status: DC

## 2016-11-28 MED ORDER — HYDROMORPHONE HCL 1 MG/ML IJ SOLN
1.0000 mg | Freq: Once | INTRAMUSCULAR | Status: AC
  Administered 2016-11-28: 1 mg via INTRAMUSCULAR
  Filled 2016-11-28: qty 1

## 2016-11-28 MED ORDER — OXYCODONE-ACETAMINOPHEN 5-325 MG PO TABS: 1.0000 | ORAL_TABLET | Freq: Four times a day (QID) | ORAL | 0 refills | Status: DC | PRN

## 2016-11-28 NOTE — ED Notes (Signed)
Patient transported to X-ray 

## 2016-11-28 NOTE — Discharge Instructions (Signed)
Follow-up with your family doctor in a week if not improving. Do not do any lifting over 5 pounds

## 2016-11-28 NOTE — ED Provider Notes (Signed)
[facility] Provider Note   CSN: [id number] Arrival date & time: 11/28/16  1448     History   Chief Complaint Chief Complaint  Patient presents with  . Back Pain    HPI Valerie Robinson is a 31 y.o. female.  Patient states that she was doing some heavy lifting of has severe lower back pain that radiates into both legs   The history is provided by the patient.  Back Pain   This is a new problem. The current episode started 2 days ago. The problem occurs constantly. The problem has not changed since onset.Associated with: back pain. The pain is present in the lumbar spine. The quality of the pain is described as aching. Radiates to: Radiates down both legs. The pain is at a severity of 6/10. The pain is moderate. The symptoms are aggravated by twisting. The pain is worse during the day. Pertinent negatives include no chest pain, no headaches and no abdominal pain.    Past Medical History:  Diagnosis Date  . Anxiety   . Bradycardia    pt says was seen at [facility] for bradycardia but was never put on any medication for it.  . Chronic pelvic pain in female   . Drug-seeking behavior 2015  . Ovarian cyst   . Renal disorder    kidney stone    There are no active problems to display for this patient.   Past Surgical History:  Procedure Laterality Date  . APPENDECTOMY    . CESAREAN SECTION     x2  . CHOLECYSTECTOMY N/A 03/03/2013   Procedure: LAPAROSCOPIC CHOLECYSTECTOMY;  Surgeon: Dalia Heading, MD;  Location: [facility];  Service: General;  Laterality: N/A;  . CHOLECYSTECTOMY  2014    OB History    Gravida Para Term Preterm AB Living   SAB TAB Ectopic Multiple Live Births                   Home Medications    Prior to Admission medications   Medication Sig Start Date End Date Taking? Authorizing Provider  cyclobenzaprine (FLEXERIL) 10 MG tablet Take 1 tablet (10 mg total) by mouth 3 (three) times daily. 11/28/16   Bethann Berkshire, MD    Doxylamine-Pyridoxine (DICLEGIS) 10-10 MG TBEC Take 1 tablet by mouth at bedtime as needed. 09/14/15   Glynn Octave, MD  oxyCODONE-acetaminophen (PERCOCET/ROXICET) 5-325 MG tablet Take 1-2 tablets by mouth every 6 (six) hours as needed. 11/28/16   Bethann Berkshire, MD  Prenatal Multivit-Min-Fe-FA (PRENATAL VITAMINS) 0.8 MG tablet Take 1 tablet by mouth daily. 09/14/15   Glynn Octave, MD    Family History History reviewed. No pertinent family history.  Social History Social History  Substance Use Topics  . Smoking status: Current Every Day Smoker    Packs/day: 0.50    Years: 3.00    Types: Cigarettes  . Smokeless tobacco: Never Used  . Alcohol use No     Allergies   Motrin [ibuprofen]; Naprosyn [naproxen]; and Tramadol   Review of Systems Review of Systems  Constitutional: Negative for appetite change and fatigue.  HENT: Negative for congestion, ear discharge and sinus pressure.   Eyes: Negative for discharge.  Respiratory: Negative for cough.   Cardiovascular: Negative for chest pain.  Gastrointestinal: Negative for abdominal pain and diarrhea.  Genitourinary: Negative for frequency and hematuria.  Musculoskeletal: Positive for back pain.  Skin: Negative for rash.  Neurological: Negative for seizures and  headaches.  Psychiatric/Behavioral: Negative for hallucinations.     Physical Exam Updated Vital Signs BP 137/68 (BP Location: Left Arm)   Pulse 62   Temp 98.7 F (37.1 C) (Oral)   Resp 18   Ht  (1.676 m)   Wt 230 lb (104.3 kg)   SpO2 98%   Breastfeeding? Unknown   BMI 37.12 kg/m   Physical Exam  Constitutional: She is oriented to person, place, and time. She appears well-developed.  HENT:  Head: Normocephalic.  Eyes: Conjunctivae and EOM are normal. No scleral icterus.  Neck: Neck supple. No thyromegaly present.  Cardiovascular: Normal rate and regular rhythm.  Exam reveals no gallop and no friction rub.   No murmur heard. Pulmonary/Chest: No  stridor. She has no wheezes. She has no rales. She exhibits no tenderness.  Abdominal: She exhibits no distension. There is no tenderness. There is no rebound.  Musculoskeletal: Normal range of motion. She exhibits no edema.  Tender lumbar spine with bilateral positive straight leg raise  Lymphadenopathy:    She has no cervical adenopathy.  Neurological: She is oriented to person, place, and time. She exhibits normal muscle tone. Coordination normal.  Skin: No rash noted. No erythema.  Psychiatric: She has a normal mood and affect. Her behavior is normal.     ED Treatments / Results  Labs (all labs ordered are listed, but only abnormal results are displayed) Labs Reviewed - No data to display  EKG  EKG Interpretation None       Radiology Dg Lumbar Spine Complete  Result Date: 11/28/2016 CLINICAL DATA:  Pain after moving large dresser today, most pain right lower back and radiating down right hip EXAM: LUMBAR SPINE - COMPLETE 4+ VIEW COMPARISON:  None. FINDINGS: There is no evidence of lumbar spine fracture. Alignment is normal. Intervertebral disc spaces are maintained. IMPRESSION: Negative. Electronically Signed   By: Bary Richard M.D.   On: 11/28/2016 17:05    Procedures Procedures (including critical care time)  Medications Ordered in ED Medications  HYDROmorphone (DILAUDID) injection 1 mg (1 mg Intramuscular Given 11/28/16 1624)     Initial Impression / Assessment and Plan / ED Course  I have reviewed the triage vital signs and the nursing notes.  Pertinent labs & imaging results that were available during my care of the patient were reviewed by me and considered in my medical decision making (see chart for details).     Patient with lumbar back pain. Patient will be treated with Flexeril and Percocet and will follow-up with PCP  Final Clinical Impressions(s) / ED Diagnoses   Final diagnoses:  Acute bilateral low back pain with sciatica, sciatica laterality  unspecified    New Prescriptions New Prescriptions   CYCLOBENZAPRINE (FLEXERIL) 10 MG TABLET    Take 1 tablet (10 mg total) by mouth 3 (three) times daily.   OXYCODONE-ACETAMINOPHEN (PERCOCET/ROXICET) 5-325 MG TABLET    Take 1-2 tablets by mouth every 6 (six) hours as needed.     Bethann Berkshire, MD 11/28/16 [phone]

## 2016-11-28 NOTE — ED Triage Notes (Signed)
Pt reports picking up a dresser today and feeling a pop in her back.  Started about 10 am.

## 2017-01-07 ENCOUNTER — Encounter (HOSPITAL_COMMUNITY): Payer: Self-pay

## 2017-01-07 ENCOUNTER — Emergency Department (HOSPITAL_COMMUNITY)
Admission: EM | Admit: 2017-01-07 | Discharge: 2017-01-07 | Disposition: A | Discharge status: Home / Self Care | Payer: Medicaid Other | Attending: Emergency Medicine | Admitting: Emergency Medicine

## 2017-01-07 DIAGNOSIS — F419 Anxiety disorder, unspecified: Secondary | ICD-10-CM | POA: Insufficient documentation

## 2017-01-07 DIAGNOSIS — Z79899 Other long term (current) drug therapy: Secondary | ICD-10-CM | POA: Insufficient documentation

## 2017-01-07 DIAGNOSIS — F17210 Nicotine dependence, cigarettes, uncomplicated: Secondary | ICD-10-CM | POA: Insufficient documentation

## 2017-01-07 MED ORDER — HYDROXYZINE HCL 25 MG PO TABS: 25.0000 mg | ORAL_TABLET | Freq: Four times a day (QID) | ORAL | 0 refills | Status: DC | PRN

## 2017-01-07 MED ORDER — LORAZEPAM 1 MG PO TABS
1.0000 mg | ORAL_TABLET | Freq: Once | ORAL | Status: AC
  Administered 2017-01-07: 1 mg via ORAL
  Filled 2017-01-07: qty 1

## 2017-01-07 NOTE — Discharge Instructions (Signed)
You are given resources for mental health and PCP follow-up  Please return without fail for worsening symptoms, including difficulty breathing, passing out, thoughts of hurting self or others or any other symptoms concerning to you.

## 2017-01-07 NOTE — ED Provider Notes (Signed)
[facility] Provider Note   CSN: [id number] Arrival date & time: 01/07/17  1929     History   Chief Complaint Chief Complaint  Patient presents with  . Anxiety    HPI Valerie Robinson is a 31 y.o. female.  The history is provided by the patient.  Anxiety  This is a recurrent problem. The current episode started more than 2 days ago. The problem occurs daily. The problem has not changed since onset.Pertinent negatives include no chest pain, no abdominal pain, no headaches and no shortness of breath. Nothing aggravates the symptoms. Nothing relieves the symptoms. She has tried nothing for the symptoms.   [age] year old female who presents with anxiety attacks. She has a history of anxiety, but never seen a therapist or psychiatrist never been on medications before in the past other than Xanax. States that her father died 3 days ago, and she has been very close to him. Since then she has had increasing anxiety and depression with increased panic attacks. States that she has had panic attacks in the past, this is exact same but now having difficulty dealing with it on her own. Denies any suicidal or homicidal thoughts. Denies any illicit drug use or alcohol abuse. Denies any auditory visual hallucinations. States that she has very good support system and her fianc and family members. If that she is mainly requesting resources for outpatient psychiatric follow-up as well as anxiety medicaitons. She denies any chest pain, difficulty breathing, syncope or near syncope, abdominal pain, nausea or vomiting. Denies any other recent illnesses. Past Medical History:  Diagnosis Date  . Anxiety   . Bradycardia    pt says [facility] but was never put on any medication for it.  . Chronic pelvic pain in female   . Drug-seeking behavior 2015  . Ovarian cyst   . Renal disorder    kidney stone    There are no active problems to display for this patient.   Past Surgical  History:  Procedure Laterality Date  . APPENDECTOMY    . CESAREAN SECTION     x2  . CHOLECYSTECTOMY N/A 03/03/2013   Procedure: LAPAROSCOPIC CHOLECYSTECTOMY;  Surgeon: Dalia HeadingMark A Jenkins, MD;  Location: [facility];  Service: General;  Laterality: N/A;  . CHOLECYSTECTOMY  2014    OB History    Gravida Para Term Preterm AB Living   4 3 3     3    SAB TAB Ectopic Multiple Live Births                   Home Medications    Prior to Admission medications   Medication Sig Start Date End Date Taking? Authorizing Provider  cyclobenzaprine (FLEXERIL) 10 MG tablet Take 1 tablet (10 mg total) by mouth 3 (three) times daily. 11/28/16   Bethann BerkshireZammit, Joseph, MD  Doxylamine-Pyridoxine (DICLEGIS) 10-10 MG TBEC Take 1 tablet by mouth at bedtime as needed. 09/14/15   Rancour, Jeannett SeniorStephen, MD  hydrOXYzine (ATARAX/VISTARIL) 25 MG tablet Take 1 tablet (25 mg total) by mouth every 6 (six) hours as needed for anxiety. 01/07/17   Lavera GuiseLiu, Dana Duo, MD  oxyCODONE-acetaminophen (PERCOCET/ROXICET) 5-325 MG tablet Take 1-2 tablets by mouth every 6 (six) hours as needed. 11/28/16   Bethann BerkshireZammit, Joseph, MD  Prenatal Multivit-Min-Fe-FA (PRENATAL VITAMINS) 0.8 MG tablet Take 1 tablet by mouth daily. 09/14/15   Glynn Octaveancour, Stephen, MD    Family History No family history on file.  Social History Social History  Substance Use  Topics  . Smoking status: Current Every Day Smoker    Packs/day: 0.50    Years: 3.00    Types: Cigarettes  . Smokeless tobacco: Never Used  . Alcohol use No     Allergies   Motrin [ibuprofen]; Naprosyn [naproxen]; and Tramadol   Review of Systems Review of Systems  Constitutional: Negative for fever.  HENT: Negative for congestion.   Respiratory: Negative for shortness of breath.   Cardiovascular: Negative for chest pain.  Gastrointestinal: Negative for abdominal pain.  Neurological: Negative for headaches.  All other systems reviewed and are negative.    Physical Exam Updated Vital Signs BP 117/69 (BP  Location: Left Arm)   Pulse 88   Temp 98.7 F (37.1 C) (Temporal)   Resp 16   Ht 5\' 6"  (1.676 m)   Wt 215 lb (97.5 kg)   LMP  (LMP Unknown)   SpO2 100%   BMI 34.70 kg/m   Physical Exam Physical Exam  Nursing note and vitals reviewed. Constitutional:  non-toxic, and in no acute distress Head: Normocephalic and atraumatic.  Mouth/Throat: Oropharynx is clear and moist.  Neck: Normal range of motion. Neck supple.  Cardiovascular: Normal rate and regular rhythm.   Pulmonary/Chest: Effort normal and breath sounds normal.  Abdominal: Soft. There is no tenderness. There is no rebound and no guarding.  Musculoskeletal: Normal range of motion.  Neurological: Alert, no facial droop, fluent speech, moves all extremities symmetrically Skin: Skin is warm and dry.  Psychiatric: Cooperative   ED Treatments / Results  Labs (all labs ordered are listed, but only abnormal results are displayed) Labs Reviewed - No data to display  EKG  EKG Interpretation None       Radiology No results found.  Procedures Procedures (including critical care time)  Medications Ordered in ED Medications  LORazepam (ATIVAN) tablet 1 mg (not administered)     Initial Impression / Assessment and Plan / ED Course  I have reviewed the triage vital signs and the nursing notes.  Pertinent labs & imaging results that were available during my care of the patient were reviewed by me and considered in my medical decision making (see chart for details).    Patient well-appearing in no acute distress with normal vital signs. Currently not having an anxiety attack, but is mildly tearful when she is talking about passing away of her father. At this time I do not think that she requires any inpatient admission for psychiatric evaluation. She is given resources for outpatient mental health. Strict return and follow-up instructions reviewed. She expressed understanding of all discharge instructions and felt  comfortable with the plan of care.    Final Clinical Impressions(s) / ED Diagnoses   Final diagnoses:  Anxiety    New Prescriptions New Prescriptions   HYDROXYZINE (ATARAX/VISTARIL) 25 MG TABLET    Take 1 tablet (25 mg total) by mouth every 6 (six) hours as needed for anxiety.     Lavera Guise, MD 01/07/17 2016

## 2017-01-07 NOTE — ED Triage Notes (Signed)
States her father died Monday and needs something for her anxiety. States she feels like she needs to speak to a psychiatrist.  Denies SI/HI/AVH. Hx of depression but not currently on any medications.

## 2017-03-05 ENCOUNTER — Encounter (HOSPITAL_COMMUNITY): Payer: Self-pay | Admitting: Emergency Medicine

## 2017-03-05 ENCOUNTER — Emergency Department (HOSPITAL_COMMUNITY): Payer: Medicaid Other

## 2017-03-05 ENCOUNTER — Emergency Department (HOSPITAL_COMMUNITY)
Admission: EM | Admit: 2017-03-05 | Discharge: 2017-03-05 | Disposition: A | Discharge status: Home / Self Care | Payer: Medicaid Other | Attending: Emergency Medicine | Admitting: Emergency Medicine

## 2017-03-05 DIAGNOSIS — Z9089 Acquired absence of other organs: Secondary | ICD-10-CM | POA: Insufficient documentation

## 2017-03-05 DIAGNOSIS — F17210 Nicotine dependence, cigarettes, uncomplicated: Secondary | ICD-10-CM | POA: Insufficient documentation

## 2017-03-05 DIAGNOSIS — R102 Pelvic and perineal pain: Secondary | ICD-10-CM | POA: Insufficient documentation

## 2017-03-05 MED ORDER — ACETAMINOPHEN 500 MG PO TABS: 1000.0000 mg | ORAL_TABLET | Freq: Once | ORAL | Status: DC

## 2017-03-05 NOTE — ED Triage Notes (Signed)
Pt was in U/S

## 2017-03-05 NOTE — ED Notes (Signed)
Pt not in room since shift change.  Spoke with Dr. Clarene DukeMcManus.  Dr states that she spoke with pt about US results and requested a urine sample but has not seen pt since.

## 2017-03-05 NOTE — ED Notes (Signed)
Pt still not in room. 

## 2017-03-05 NOTE — ED Triage Notes (Signed)
Pt having RLQ pain, hx ovarian cyst, u/s complete

## 2017-03-05 NOTE — ED Provider Notes (Signed)
[facility] Provider Note   CSN: [id number] Arrival date & time: 03/05/17  1630     History   Chief Complaint Chief Complaint  Patient presents with  . Pelvic Pain    HPI Valerie Robinson is a 31 y.o. female.  HPI Pt was seen at 1810.  Per pt, c/o gradual onset and persistence of constant pelvic "pain" since yesterday.  Has been associated with no other symptoms.  Describes the abd pain as "sharp," and "I think it's my ovary." Pain worsens with palpation of the area. Endorses hx of similar pain, dx with musculoskeletal pain by her OB/GYN MD.  Denies N/V, no diarrhea, no fevers, no flank or back pain, no rash, no CP/SOB, no black or blood in stools, no vaginal bleeding/discharge, no dysuria/hematuria.      Past Medical History:  Diagnosis Date  . Anxiety   . Bradycardia    pt says was [facility] at [facility] but was never put on any medication for it.  . Chronic pelvic pain in female   . Drug-seeking behavior 2015  . Ovarian cyst   . Renal disorder    kidney stone    There are no active problems to display for this patient.   Past Surgical History:  Procedure Laterality Date  . APPENDECTOMY    . CESAREAN SECTION     x2  . CHOLECYSTECTOMY N/A 03/03/2013   Procedure: LAPAROSCOPIC CHOLECYSTECTOMY;  Surgeon: Dalia HeadingMark A Jenkins, MD;  Location: [facility];  Service: General;  Laterality: N/A;  . CHOLECYSTECTOMY  2014    OB History    Gravida Para Term Preterm AB Living   4 3 3     3    SAB TAB Ectopic Multiple Live Births                   Home Medications    Prior to Admission medications   Not on File    Family History No family history on file.  Social History Social History  Substance Use Topics  . Smoking status: Current Every Day Smoker    Packs/day: 0.50    Years: 3.00    Types: Cigarettes  . Smokeless tobacco: Never Used  . Alcohol use No     Allergies   Motrin [ibuprofen]; Naprosyn [naproxen]; and Tramadol   Review of  Systems Review of Systems ROS: Statement: All systems negative except as marked or noted in the HPI; Constitutional: Negative for fever and chills. ; ; Eyes: Negative for eye pain, redness and discharge. ; ; ENMT: Negative for ear pain, hoarseness, nasal congestion, sinus pressure and sore throat. ; ; Cardiovascular: Negative for chest pain, palpitations, diaphoresis, dyspnea and peripheral edema. ; ; Respiratory: Negative for cough, wheezing and stridor. ; ; Gastrointestinal: Negative for nausea, vomiting, diarrhea, abdominal pain, blood in stool, hematemesis, jaundice and rectal bleeding. . ; ; Genitourinary: Negative for dysuria, flank pain and hematuria. ; ; GYN:  +pelvic pain, no vaginal bleeding, no vaginal discharge, no vulvar pain. ;; Musculoskeletal: Negative for back pain and neck pain. Negative for swelling and trauma.; ; Skin: Negative for pruritus, rash, abrasions, blisters, bruising and skin lesion.; ; Neuro: Negative for headache, lightheadedness and neck stiffness. Negative for weakness, altered level of consciousness, altered mental status, extremity weakness, paresthesias, involuntary movement, seizure and syncope.       Physical Exam Updated Vital Signs BP 131/80 (BP Location: Right Arm)   Pulse (!) 57   Temp 99.3 F (37.4 C) (Oral)  Resp 20   Ht 5\' 6"  (1.676 m)   Wt 102.1 kg (225 lb)   SpO2 94%   BMI 36.32 kg/m   Physical Exam 1815: Physical examination:  Nursing notes reviewed; Vital signs and O2 SAT reviewed;  Constitutional: Well developed, Well nourished, Well hydrated, In no acute distress; Head:  Normocephalic, atraumatic; Eyes: EOMI, PERRL, No scleral icterus; ENMT: Mouth and pharynx normal, Mucous membranes moist; Neck: Supple, Full range of motion, No lymphadenopathy; Cardiovascular: Regular rate and rhythm, No gallop; Respiratory: Breath sounds clear & equal bilaterally, No wheezes.  Speaking full sentences with ease, Normal respiratory effort/excursion; Chest:  Nontender, Movement normal; Abdomen: Soft, Nontender, +superior right sided pubic bone area tender to palp. Nondistended, Normal bowel sounds; Pelvic exam performed with permission of pt and female ED tech assist during exam.  External genitalia w/o lesions. Vaginal vault without discharge.  Cervix w/o lesions, not friable, GC/chlam and wet prep obtained and sent to lab.  Bimanual exam w/o CMT, uterine or adnexal tenderness.  Genitourinary: No CVA tenderness; Extremities: Pulses normal, No tenderness, No edema, No calf edema or asymmetry.; Neuro: AA&Ox3, Major CN grossly intact.  Speech clear. No gross focal motor or sensory deficits in extremities. Climbs on and off stretcher easily by herself. Gait steady.; Skin: Color normal, Warm, Dry.   ED Treatments / Results  Labs (all labs ordered are listed, but only abnormal results are displayed)   EKG  EKG Interpretation None       Radiology   Procedures Procedures (including critical care time)  Medications Ordered in ED Medications - No data to display   Initial Impression / Assessment and Plan / ED Course  I have reviewed the triage vital signs and the nursing notes.  Pertinent labs & imaging results that were available during my care of the patient were reviewed by me and considered in my medical decision making (see chart for details).  MDM Reviewed: previous chart, nursing note and vitals Interpretation: ultrasound and labs    US Pelvis Complete Result Date: 03/05/2017 CLINICAL DATA:  Pelvic pain EXAM: TRANSABDOMINAL AND TRANSVAGINAL ULTRASOUND OF PELVIS DOPPLER ULTRASOUND OF OVARIES TECHNIQUE: Both transabdominal and transvaginal ultrasound examinations of the pelvis were performed. Transabdominal technique was performed for global imaging of the pelvis including uterus, ovaries, adnexal regions, and pelvic cul-de-sac. It was necessary to proceed with endovaginal exam following the transabdominal exam to visualize the uterus,  endometrium, ovaries and adnexa . Color and duplex Doppler ultrasound was utilized to evaluate blood flow to the ovaries. COMPARISON:  Non FINDINGS: Uterus Measurements: 7.1 x 3.2 x 5.1 cm. No fibroids or other mass visualized. Endometrium Thickness: 5 mm in thickness. Small endometrial calcifications. No suspicious focal abnormality. Right ovary Measurements: 3.3 x 2.1 x 2.3 cm. Multiple small follicles. Left ovary Measurements: 2.8 x 81.6 x 2.1 cm. Normal appearance/no adnexal mass. Pulsed Doppler evaluation of both ovaries demonstrates normal low-resistance arterial and venous waveforms. Other findings No abnormal free fluid. IMPRESSION: No acute findings or significant abnormality. Electronically Signed   By: Charlett Nose M.D.   On: 03/05/2017 17:43     1915:  Pt no longer in her exam room and cannot be found in the ED. Eloped.    Final Clinical Impressions(s) / ED Diagnoses   Final diagnoses:  Pelvic pain  Pelvic pain    New Prescriptions New Prescriptions   No medications on file     Samuel Jester, DO 03/10/17 1610

## 2017-03-05 NOTE — ED Triage Notes (Signed)
Called to triage, not in waiting area

## 2017-03-05 NOTE — ED Notes (Signed)
Pt not in room at this time

## 2017-06-20 ENCOUNTER — Encounter (HOSPITAL_COMMUNITY): Payer: Self-pay | Admitting: Emergency Medicine

## 2017-06-20 ENCOUNTER — Emergency Department (HOSPITAL_COMMUNITY)
Admission: EM | Admit: 2017-06-20 | Discharge: 2017-06-20 | Disposition: A | Discharge status: Home / Self Care | Payer: Medicaid Other | Attending: Emergency Medicine | Admitting: Emergency Medicine

## 2017-06-20 DIAGNOSIS — Y998 Other external cause status: Secondary | ICD-10-CM | POA: Diagnosis not present

## 2017-06-20 DIAGNOSIS — F17210 Nicotine dependence, cigarettes, uncomplicated: Secondary | ICD-10-CM | POA: Insufficient documentation

## 2017-06-20 DIAGNOSIS — W57XXXA Bitten or stung by nonvenomous insect and other nonvenomous arthropods, initial encounter: Secondary | ICD-10-CM | POA: Diagnosis not present

## 2017-06-20 DIAGNOSIS — Y929 Unspecified place or not applicable: Secondary | ICD-10-CM | POA: Diagnosis not present

## 2017-06-20 DIAGNOSIS — S30861A Insect bite (nonvenomous) of abdominal wall, initial encounter: Secondary | ICD-10-CM | POA: Diagnosis not present

## 2017-06-20 DIAGNOSIS — R21 Rash and other nonspecific skin eruption: Secondary | ICD-10-CM | POA: Diagnosis present

## 2017-06-20 DIAGNOSIS — Y939 Activity, unspecified: Secondary | ICD-10-CM | POA: Insufficient documentation

## 2017-06-20 MED ORDER — DOXYCYCLINE HYCLATE 100 MG PO TABS
100.0000 mg | ORAL_TABLET | Freq: Once | ORAL | Status: AC
  Administered 2017-06-20: 100 mg via ORAL
  Filled 2017-06-20: qty 1

## 2017-06-20 MED ORDER — PROMETHAZINE HCL 12.5 MG PO TABS
12.5000 mg | ORAL_TABLET | Freq: Once | ORAL | Status: AC
  Administered 2017-06-20: 12.5 mg via ORAL
  Filled 2017-06-20: qty 1

## 2017-06-20 MED ORDER — DOXYCYCLINE HYCLATE 100 MG PO CAPS: 100.0000 mg | ORAL_CAPSULE | Freq: Two times a day (BID) | ORAL | 0 refills | Status: DC

## 2017-06-20 MED ORDER — PREDNISONE 20 MG PO TABS
40.0000 mg | ORAL_TABLET | Freq: Once | ORAL | Status: AC
  Administered 2017-06-20: 40 mg via ORAL
  Filled 2017-06-20: qty 2

## 2017-06-20 MED ORDER — DEXAMETHASONE 4 MG PO TABS
4.0000 mg | ORAL_TABLET | Freq: Two times a day (BID) | ORAL | 0 refills | Status: DC
Start: 2017-06-20 — End: 2017-11-02

## 2017-06-20 MED ORDER — ACETAMINOPHEN 500 MG PO TABS
1000.0000 mg | ORAL_TABLET | Freq: Once | ORAL | Status: AC
  Administered 2017-06-20: 1000 mg via ORAL
  Filled 2017-06-20: qty 2

## 2017-06-20 NOTE — ED Triage Notes (Signed)
Insect bite to left lateral abdomen.  Rates pain 7/10.

## 2017-06-20 NOTE — ED Provider Notes (Signed)
[facility] EMERGENCY DEPARTMENT Provider Note   CSN: [id number] Arrival date & time: 06/20/17  1626     History   Chief Complaint Chief Complaint  Patient presents with  . Insect Bite    HPI Valerie Robinson is a 31 y.o. female.  Patient is a 31 year old female who presents to the emergency department with complaint of left abdomen insect bite.  The patient states that about 4 or 5 days ago she noticed a small pimple from a bite on her left lower abdomen.  It continued to get larger and more red.  She states now she has pain when she moves about it.  She has not had any drainage from the bite area.  She has not noted red streaks.  She has not had any high fever to be reported.  She does not have a history of methicillin-resistant staph.  She presents now for evaluation of this bite area.      Past Medical History:  Diagnosis Date  . Anxiety   . Bradycardia    pt says [facility] but was never put on any medication for it.  . Chronic pelvic pain in female   . Drug-seeking behavior 2015  . Ovarian cyst   . Renal disorder    kidney stone    There are no active problems to display for this patient.   Past Surgical History:  Procedure Laterality Date  . APPENDECTOMY    . CESAREAN SECTION     x2  . CHOLECYSTECTOMY N/A 03/03/2013   Procedure: LAPAROSCOPIC CHOLECYSTECTOMY;  Surgeon: Dalia HeadingMark A Jenkins, MD;  Location: [facility];  Service: General;  Laterality: N/A;  . CHOLECYSTECTOMY  2014    OB History    Gravida Para Term Preterm AB Living   4 3 3     3    SAB TAB Ectopic Multiple Live Births                   Home Medications    Prior to Admission medications   Not on File    Family History History reviewed. No pertinent family history.  Social History Social History  Substance Use Topics  . Smoking status: Current Every Day Smoker    Packs/day: 0.50    Years: 3.00    Types: Cigarettes  . Smokeless tobacco: Never Used  . Alcohol use  No     Allergies   Motrin [ibuprofen]; Naprosyn [naproxen]; and Tramadol   Review of Systems Review of Systems  Constitutional: Negative for activity change.       All ROS Neg except as noted in HPI  HENT: Negative for nosebleeds.   Eyes: Negative for photophobia and discharge.  Respiratory: Negative for cough, shortness of breath and wheezing.   Cardiovascular: Negative for chest pain and palpitations.  Gastrointestinal: Negative for abdominal pain and blood in stool.  Genitourinary: Negative for dysuria, frequency and hematuria.  Musculoskeletal: Negative for arthralgias, back pain and neck pain.  Skin: Negative.        Insect bite  Neurological: Negative for dizziness, seizures and speech difficulty.  Psychiatric/Behavioral: Negative for confusion and hallucinations.     Physical Exam Updated Vital Signs BP 109/65 (BP Location: Left Arm)   Pulse 60   Temp 98.5 F (36.9 C) (Oral)   Resp 16   Ht 5\' 6"  (1.676 m)   Wt 102.1 kg (225 lb)   SpO2 96%   BMI 36.32 kg/m   Physical Exam  Constitutional: She is oriented to person, place, and time. She appears well-developed and well-nourished.  Non-toxic appearance.  HENT:  Head: Normocephalic.  Right Ear: Tympanic membrane and external ear normal.  Left Ear: Tympanic membrane and external ear normal.  Eyes: Pupils are equal, round, and reactive to light. EOM and lids are normal.  Neck: Normal range of motion. Neck supple. Carotid bruit is not present.  Cardiovascular: Normal rate, regular rhythm, normal heart sounds, intact distal pulses and normal pulses.   Pulmonary/Chest: Breath sounds normal. No respiratory distress.  Abdominal: Soft. Bowel sounds are normal. There is no tenderness. There is no guarding.  Musculoskeletal: Normal range of motion.  Lymphadenopathy:       Head (right side): No submandibular adenopathy present.       Head (left side): No submandibular adenopathy present.    She has no cervical adenopathy.   Neurological: She is alert and oriented to person, place, and time. She has normal strength. No cranial nerve deficit or sensory deficit.  Skin: Skin is warm and dry.  There is a quarter size red area at the left lower abdomen.  There is no red streaks appreciated.  The area is sore to palpation.  There is no drainage noted at this time.  Psychiatric: She has a normal mood and affect. Her speech is normal.  Nursing note and vitals reviewed.    ED Treatments / Results  Labs (all labs ordered are listed, but only abnormal results are displayed) Labs Reviewed - No data to display  EKG  EKG Interpretation None       Radiology No results found.  Procedures Procedures (including critical care time)  Medications Ordered in ED Medications - No data to display   Initial Impression / Assessment and Plan / ED Course  I have reviewed the triage vital signs and the nursing notes.  Pertinent labs & imaging results that were available during my care of the patient were reviewed by me and considered in my medical decision making (see chart for details).       Final Clinical Impressions(s) / ED Diagnoses MDM Vital signs within normal limits.  Patient has sustained an insect bite to the left lower abdomen.  No red streaks appreciated and no drainage.  No history of high fever, nausea, or vomiting.  The patient will be treated with warm compresses, doxycycline and Decadron.   Final diagnoses:  Insect bite, initial encounter    New Prescriptions New Prescriptions   DEXAMETHASONE (DECADRON) 4 MG TABLET    Take 1 tablet (4 mg total) by mouth 2 (two) times daily with a meal.   DOXYCYCLINE (VIBRAMYCIN) 100 MG CAPSULE    Take 1 capsule (100 mg total) by mouth 2 (two) times daily.     Valerie Quale, PA-C 06/20/17 1725    Raeford Razor, MD 06/22/17 [phone]

## 2017-06-20 NOTE — Discharge Instructions (Signed)
Please do not insert any objects in the red raised area.  This will cause advancing of infection.  Please use doxycycline and Decadron 2 times daily with food.  Use warm tub soaks or warm compresses to the area.  Please apply Band-Aid so that your clothing will not affect the red raised area.  Please see your Medicaid access physician for additional evaluation if not improving.

## 2017-08-04 ENCOUNTER — Other Ambulatory Visit: Payer: Self-pay

## 2017-08-04 ENCOUNTER — Emergency Department (HOSPITAL_COMMUNITY): Payer: Medicaid Other

## 2017-08-04 ENCOUNTER — Encounter (HOSPITAL_COMMUNITY): Payer: Self-pay | Admitting: *Deleted

## 2017-08-04 ENCOUNTER — Emergency Department (HOSPITAL_COMMUNITY)
Admission: EM | Admit: 2017-08-04 | Discharge: 2017-08-04 | Disposition: A | Discharge status: Home / Self Care | Payer: Medicaid Other | Attending: Emergency Medicine | Admitting: Emergency Medicine

## 2017-08-04 DIAGNOSIS — Y929 Unspecified place or not applicable: Secondary | ICD-10-CM | POA: Insufficient documentation

## 2017-08-04 DIAGNOSIS — X58XXXA Exposure to other specified factors, initial encounter: Secondary | ICD-10-CM | POA: Insufficient documentation

## 2017-08-04 DIAGNOSIS — F17210 Nicotine dependence, cigarettes, uncomplicated: Secondary | ICD-10-CM | POA: Insufficient documentation

## 2017-08-04 DIAGNOSIS — S63501A Unspecified sprain of right wrist, initial encounter: Secondary | ICD-10-CM | POA: Diagnosis present

## 2017-08-04 DIAGNOSIS — Z79899 Other long term (current) drug therapy: Secondary | ICD-10-CM | POA: Diagnosis not present

## 2017-08-04 DIAGNOSIS — Y939 Activity, unspecified: Secondary | ICD-10-CM | POA: Insufficient documentation

## 2017-08-04 DIAGNOSIS — Y999 Unspecified external cause status: Secondary | ICD-10-CM | POA: Insufficient documentation

## 2017-08-04 MED ORDER — HYDROCODONE-ACETAMINOPHEN 5-325 MG PO TABS
2.0000 | ORAL_TABLET | Freq: Once | ORAL | Status: AC
  Administered 2017-08-04: 2 via ORAL
  Filled 2017-08-04: qty 2

## 2017-08-04 MED ORDER — ONDANSETRON HCL 4 MG PO TABS
4.0000 mg | ORAL_TABLET | Freq: Once | ORAL | Status: AC
  Administered 2017-08-04: 4 mg via ORAL
  Filled 2017-08-04: qty 1

## 2017-08-04 MED ORDER — HYDROCODONE-ACETAMINOPHEN 5-325 MG PO TABS: 1.0000 | ORAL_TABLET | ORAL | 0 refills | Status: DC | PRN

## 2017-08-04 NOTE — Discharge Instructions (Signed)
Your vital signs are within normal limits.  Your x-ray is negative for fracture or dislocation.  There are no gross neurologic or vascular deficits appreciated.  Please use the wrist splint over the next 5-7 days.  Use Tylenol extra strength for mild to moderate pain. Use Norco for more severe pain. See Dr Romeo AppleHarrison for orthopedic eval if not improving.

## 2017-08-04 NOTE — ED Provider Notes (Signed)
[facility] EMERGENCY DEPARTMENT Provider Note   CSN: [id number] Arrival date & time: 08/04/17  1123     History   Chief Complaint Chief Complaint  Patient presents with  . Wrist Injury    HPI Valerie Robinson is a 31 y.o. female.   Wrist Pain  This is a new problem. The current episode started yesterday. The problem occurs constantly. The problem has been gradually worsening. Pertinent negatives include no chest pain, no abdominal pain, no headaches and no shortness of breath. Exacerbated by: movement and bending right wrist. Nothing relieves the symptoms. She has tried acetaminophen for the symptoms. The treatment provided no relief.    Past Medical History:  Diagnosis Date  . Anxiety   . Bradycardia    pt says was seen at [facility] for bradycardia but was never put on any medication for it.  . Chronic pelvic pain in female   . Drug-seeking behavior 2015  . Ovarian cyst   . Renal disorder    kidney stone    There are no active problems to display for this patient.   Past Surgical History:  Procedure Laterality Date  . APPENDECTOMY    . CESAREAN SECTION     x2  . CHOLECYSTECTOMY N/A 03/03/2013   Procedure: LAPAROSCOPIC CHOLECYSTECTOMY;  Surgeon: Dalia Heading, MD;  Location: [facility];  Service: General;  Laterality: N/A;  . CHOLECYSTECTOMY  2014    OB History    Gravida Para Term Preterm AB Living   4 3 3     3    SAB TAB Ectopic Multiple Live Births                   Home Medications    Prior to Admission medications   Medication Sig Start Date End Date Taking? Authorizing Provider  dexamethasone (DECADRON) 4 MG tablet Take 1 tablet (4 mg total) by mouth 2 (two) times daily with a meal. 06/20/17   Ivery Quale, PA-C  doxycycline (VIBRAMYCIN) 100 MG capsule Take 1 capsule (100 mg total) by mouth 2 (two) times daily. 06/20/17   Ivery Quale, PA-C  HYDROcodone-acetaminophen (NORCO/VICODIN) 5-325 MG tablet Take 1 tablet by mouth every 4 (four) hours as  needed. 08/04/17   Ivery Quale, PA-C    Family History No family history on file.  Social History Social History   Tobacco Use  . Smoking status: Current Every Day Smoker    Packs/day: 0.50    Years: 3.00    Pack years: 1.50    Types: Cigarettes  . Smokeless tobacco: Never Used  Substance Use Topics  . Alcohol use: No  . Drug use: No     Allergies   Motrin [ibuprofen]; Naprosyn [naproxen]; and Tramadol   Review of Systems Review of Systems  Constitutional: Negative for activity change.       All ROS Neg except as noted in HPI  HENT: Negative for nosebleeds.   Eyes: Negative for photophobia and discharge.  Respiratory: Negative for cough, shortness of breath and wheezing.   Cardiovascular: Negative for chest pain and palpitations.  Gastrointestinal: Negative for abdominal pain and blood in stool.  Genitourinary: Negative for dysuria, frequency and hematuria.  Musculoskeletal: Positive for arthralgias. Negative for back pain and neck pain.  Skin: Negative.   Neurological: Negative for dizziness, seizures, speech difficulty and headaches.  Psychiatric/Behavioral: Negative for confusion and hallucinations.     Physical Exam Updated Vital Signs BP 116/75   Pulse 78   Temp 98.4 F (36.9  C) (Oral)   Resp 18   Ht 5\' 6"  (1.676 m)   Wt 97.5 kg (215 lb)   SpO2 98%   BMI 34.70 kg/m   Physical Exam  Constitutional: She is oriented to person, place, and time. She appears well-developed and well-nourished.  Non-toxic appearance.  HENT:  Head: Normocephalic.  Right Ear: Tympanic membrane and external ear normal.  Left Ear: Tympanic membrane and external ear normal.  Eyes: EOM and lids are normal. Pupils are equal, round, and reactive to light.  Neck: Normal range of motion. Neck supple. Carotid bruit is not present.  Cardiovascular: Normal rate, regular rhythm, normal heart sounds, intact distal pulses and normal pulses.  Pulmonary/Chest: Breath sounds normal. No  respiratory distress.  Abdominal: Soft. Bowel sounds are normal. There is no tenderness. There is no guarding.  Musculoskeletal:       Right wrist: She exhibits decreased range of motion. She exhibits no swelling and no deformity.       Arms: There is full range of motion of the right shoulder and elbow.  There is pain with attempted range of motion of the right wrist.  There is full range of motion of the fingers of the right hand.  Capillary refill is less than 2 seconds.  Radial pulses 2+ on the right.  There is no deformity of the thenar eminence.  Lymphadenopathy:       Head (right side): No submandibular adenopathy present.       Head (left side): No submandibular adenopathy present.    She has no cervical adenopathy.  Neurological: She is alert and oriented to person, place, and time. She has normal strength. No cranial nerve deficit or sensory deficit.  Skin: Skin is warm and dry.  Psychiatric: She has a normal mood and affect. Her speech is normal.  Nursing note and vitals reviewed.    ED Treatments / Results  Labs (all labs ordered are listed, but only abnormal results are displayed) Labs Reviewed - No data to display  EKG  EKG Interpretation None       Radiology Dg Wrist Complete Right  Result Date: 08/04/2017 CLINICAL DATA:  Patient slipped on icy surface and fell 1 day ago. EXAM: RIGHT WRIST - COMPLETE 3+ VIEW COMPARISON:  None in PACs FINDINGS: The bones are subjectively adequately mineralized. There is no acute fracture or dislocation. The joint spaces are well maintained. The soft tissues exhibit no acute abnormalities. IMPRESSION: There is no acute bony abnormality of the right wrist. Electronically Signed   By: David  SwazilandJordan M.D.   On: 08/04/2017 13:03    Procedures Procedures (including critical care time)  Medications Ordered in ED Medications  HYDROcodone-acetaminophen (NORCO/VICODIN) 5-325 MG per tablet 2 tablet (2 tablets Oral Given 08/04/17 1630)    ondansetron (ZOFRAN) tablet 4 mg (4 mg Oral Given 08/04/17 1630)     Initial Impression / Assessment and Plan / ED Course  I have reviewed the triage vital signs and the nursing notes.  Pertinent labs & imaging results that were available during my care of the patient were reviewed by me and considered in my medical decision making (see chart for details).       Final Clinical Impressions(s) / ED Diagnoses MDM Patient sustained a fall on an outstretched hand.  She slipped on wet icy surface.  The patient continued to have pain in spite of conservative measures.  X-ray is negative for fracture or dislocation.  There are no neurovascular deficits appreciated.  Patient  fitted with a wrist splint.  I have asked the patient to have the wrist elevated above her heart.  Patient will use Tylenol and Norco for pain.  Patient will follow up with Dr. Romeo AppleHarrison for orthopedic evaluation if not improving.   Final diagnoses:  Sprain of right wrist, initial encounter    ED Discharge Orders        Ordered    HYDROcodone-acetaminophen (NORCO/VICODIN) 5-325 MG tablet  Every 4 hours PRN     08/04/17 1629       Ivery QualeBryant, Athalene Kolle, PA-C 08/04/17 1641    Samuel JesterMcManus, Kathleen, DO 08/06/17 1736

## 2017-08-04 NOTE — ED Triage Notes (Signed)
Right wrist injury

## 2017-08-04 NOTE — ED Notes (Signed)
Pt left with boyfriend. Pt was not driving.

## 2017-11-02 ENCOUNTER — Other Ambulatory Visit: Payer: Self-pay

## 2017-11-02 ENCOUNTER — Encounter (HOSPITAL_COMMUNITY): Payer: Self-pay | Admitting: *Deleted

## 2017-11-02 ENCOUNTER — Emergency Department (HOSPITAL_COMMUNITY)
Admission: EM | Admit: 2017-11-02 | Discharge: 2017-11-02 | Disposition: A | Discharge status: Home / Self Care | Payer: Medicaid Other | Attending: Emergency Medicine | Admitting: Emergency Medicine

## 2017-11-02 DIAGNOSIS — M62830 Muscle spasm of back: Secondary | ICD-10-CM | POA: Diagnosis not present

## 2017-11-02 DIAGNOSIS — F17210 Nicotine dependence, cigarettes, uncomplicated: Secondary | ICD-10-CM | POA: Insufficient documentation

## 2017-11-02 DIAGNOSIS — Z79899 Other long term (current) drug therapy: Secondary | ICD-10-CM | POA: Diagnosis not present

## 2017-11-02 DIAGNOSIS — M545 Low back pain: Secondary | ICD-10-CM | POA: Diagnosis present

## 2017-11-02 MED ORDER — DIAZEPAM 5 MG PO TABS
10.0000 mg | ORAL_TABLET | Freq: Once | ORAL | Status: AC
  Administered 2017-11-02: 10 mg via ORAL
  Filled 2017-11-02: qty 2

## 2017-11-02 MED ORDER — PREDNISONE 20 MG PO TABS
40.0000 mg | ORAL_TABLET | Freq: Once | ORAL | Status: AC
  Administered 2017-11-02: 40 mg via ORAL
  Filled 2017-11-02: qty 2

## 2017-11-02 MED ORDER — ACETAMINOPHEN 500 MG PO TABS
1000.0000 mg | ORAL_TABLET | Freq: Once | ORAL | Status: AC
  Administered 2017-11-02: 1000 mg via ORAL
  Filled 2017-11-02: qty 2

## 2017-11-02 MED ORDER — DEXAMETHASONE 4 MG PO TABS: 4.0000 mg | ORAL_TABLET | Freq: Two times a day (BID) | ORAL | 0 refills | Status: DC

## 2017-11-02 MED ORDER — CYCLOBENZAPRINE HCL 10 MG PO TABS: 10.0000 mg | ORAL_TABLET | Freq: Three times a day (TID) | ORAL | 0 refills | Status: DC

## 2017-11-02 NOTE — Discharge Instructions (Signed)
Your examination is consistent with muscle strain and muscle spasm involving your lower back.  Heating pad to the area will be helpful.  Please use Flexeril 3 times daily for spasm pain.  This medication may cause drowsiness, please use with caution.  Please use Decadron 2 times daily with food.  Use Tylenol extra strength for soreness if needed every 4 hours.  Please see your Medicaid access physician for additional evaluation, and or orthopedic referral if not improving.

## 2017-11-02 NOTE — ED Provider Notes (Signed)
[facility] Provider Note   CSN: [id number] Arrival date & time: 11/02/17  1039     History   Chief Complaint Chief Complaint  Patient presents with  . Back Pain    HPI Valerie Robinson is a 32 y.o. female.  The history is provided by the patient.  Back Pain   This is a new problem. The current episode started more than 2 days ago. The problem occurs hourly. The problem has been gradually worsening. The pain is associated with lifting heavy objects and twisting. The pain is present in the lumbar spine. The pain radiates to the right thigh (right hip). The pain is moderate. The symptoms are aggravated by twisting and certain positions. The pain is the same all the time. Pertinent negatives include no chest pain, no fever, no numbness, no abdominal pain, no bowel incontinence, no perianal numbness, no bladder incontinence and no dysuria. She has tried nothing for the symptoms.    Past Medical History:  Diagnosis Date  . Anxiety   . Bradycardia    pt [facility] but was never put on any medication for it.  . Chronic pelvic pain in female   . Drug-seeking behavior 2015  . Ovarian cyst   . Renal disorder    kidney stone    There are no active problems to display for this patient.   Past Surgical History:  Procedure Laterality Date  . APPENDECTOMY    . CESAREAN SECTION     x2  . CHOLECYSTECTOMY N/A 03/03/2013   Procedure: LAPAROSCOPIC CHOLECYSTECTOMY;  Surgeon: Dalia HeadingMark A Jenkins, MD;  Location: [facility];  Service: General;  Laterality: N/A;  . CHOLECYSTECTOMY  2014    OB History    Gravida Para Term Preterm AB Living   4 3 3     3    SAB TAB Ectopic Multiple Live Births                   Home Medications    Prior to Admission medications   Medication Sig Start Date End Date Taking? Authorizing Provider  acetaminophen (TYLENOL) 500 MG tablet Take 1,000 mg by mouth every 6 (six) hours as needed.   Yes [provider]  dexamethasone (DECADRON) 4 MG tablet Take 1 tablet (4 mg total) by mouth 2 (two) times daily with a meal. Patient not taking: Reported on 11/02/2017 06/20/17   Ivery QualeBryant, Flemon Kelty, PA-C  doxycycline (VIBRAMYCIN) 100 MG capsule Take 1 capsule (100 mg total) by mouth 2 (two) times daily. Patient not taking: Reported on 11/02/2017 06/20/17   Ivery QualeBryant, Brayen Bunn, PA-C  HYDROcodone-acetaminophen (NORCO/VICODIN) 5-325 MG tablet Take 1 tablet by mouth every 4 (four) hours as needed. Patient not taking: Reported on 11/02/2017 08/04/17   Ivery QualeBryant, Mysty Kielty, PA-C    Family History No family history on file.  Social History Social History   Tobacco Use  . Smoking status: Current Every Day Smoker    Packs/day: 0.50    Years: 3.00    Pack years: 1.50    Types: Cigarettes  . Smokeless tobacco: Never Used  Substance Use Topics  . Alcohol use: No  . Drug use: No     Allergies   Motrin [ibuprofen]; Naprosyn [naproxen]; and Tramadol   Review of Systems Review of Systems  Constitutional: Negative for activity change and fever.       All ROS Neg except as noted in HPI  HENT: Negative for nosebleeds.   Eyes:  Negative for photophobia and discharge.  Respiratory: Negative for cough, shortness of breath and wheezing.   Cardiovascular: Negative for chest pain and palpitations.  Gastrointestinal: Negative for abdominal pain, blood in stool and bowel incontinence.  Genitourinary: Negative for bladder incontinence, dysuria, frequency and hematuria.  Musculoskeletal: Positive for back pain. Negative for arthralgias and neck pain.  Skin: Negative.   Neurological: Negative for dizziness, seizures, speech difficulty and numbness.  Psychiatric/Behavioral: Negative for confusion and hallucinations.     Physical Exam Updated Vital Signs BP 117/67   Pulse (!) 47   Temp 98.5 F (36.9 C) (Oral)   Resp 15   Ht 5\' 6"  (1.676 m)   Wt 102.1 kg (225 lb)   SpO2 99%   BMI 36.32 kg/m   Physical Exam    Constitutional: She is oriented to person, place, and time. She appears well-developed and well-nourished.  Non-toxic appearance.  HENT:  Head: Normocephalic.  Right Ear: Tympanic membrane and external ear normal.  Left Ear: Tympanic membrane and external ear normal.  Eyes: EOM and lids are normal. Pupils are equal, round, and reactive to light.  Neck: Normal range of motion. Neck supple. Carotid bruit is not present.  Cardiovascular: Normal rate, regular rhythm, normal heart sounds, intact distal pulses and normal pulses.  Pulmonary/Chest: Breath sounds normal. No respiratory distress.  Abdominal: Soft. Bowel sounds are normal. There is no tenderness. There is no guarding.  Musculoskeletal: Normal range of motion.       Lumbar back: She exhibits spasm.       Back:  Lymphadenopathy:       Head (right side): No submandibular adenopathy present.       Head (left side): No submandibular adenopathy present.    She has no cervical adenopathy.  Neurological: She is alert and oriented to person, place, and time. She has normal strength. No cranial nerve deficit or sensory deficit. Coordination normal.  No motor or sensory deficits appreciated.  Skin: Skin is warm and dry.  Psychiatric: She has a normal mood and affect. Her speech is normal.  Nursing note and vitals reviewed.    ED Treatments / Results  Labs (all labs ordered are listed, but only abnormal results are displayed) Labs Reviewed - No data to display  EKG  EKG Interpretation None       Radiology No results found.  Procedures Procedures (including critical care time)  Medications Ordered in ED Medications - No data to display   Initial Impression / Assessment and Plan / ED Course  I have reviewed the triage vital signs and the nursing notes.  Pertinent labs & imaging results that were available during my care of the patient were reviewed by me and considered in my medical decision making (see chart for  details).      Final Clinical Impressions(s) / ED Diagnoses MDM  Vital signs within normal limits.  Pulse oximetry is 99% on room air.  Within normal limits by my interpretation.  On examination, patient has spasm in the left paraspinal lumbar region.  Pain aggravated by attempted range of motion of the lumbar area.  There is no decrease in sensation is in the saddle area.  There is no foot drop on ambulation, and no neurovascular deficits on examination.  Patient will be treated with Flexeril, Decadron, and Tylenol extra strength.  The patient will follow up with the Medicaid access physician for additional evaluation if not improving.   Final diagnoses:  Muscle spasm of back  ED Discharge Orders        Ordered    cyclobenzaprine (FLEXERIL) 10 MG tablet  3 times daily     11/02/17 1329    dexamethasone (DECADRON) 4 MG tablet  2 times daily with meals     11/02/17 1329       Ivery Quale, PA-C 11/02/17 1331    Bethann Berkshire, MD 11/02/17 1543

## 2017-11-02 NOTE — ED Triage Notes (Signed)
Pt c/o lower back pain that radiates down into left hip after moving this weekend. Pt reports it feels as if she has pulled a muscle. Denies urinary symptoms.

## 2018-02-22 ENCOUNTER — Emergency Department (HOSPITAL_COMMUNITY)
Admission: EM | Admit: 2018-02-22 | Discharge: 2018-02-22 | Disposition: A | Discharge status: Home / Self Care | Payer: Medicaid Other | Attending: Emergency Medicine | Admitting: Emergency Medicine

## 2018-02-22 ENCOUNTER — Other Ambulatory Visit: Payer: Self-pay

## 2018-02-22 ENCOUNTER — Encounter (HOSPITAL_COMMUNITY): Payer: Self-pay | Admitting: Emergency Medicine

## 2018-02-22 DIAGNOSIS — R42 Dizziness and giddiness: Secondary | ICD-10-CM | POA: Insufficient documentation

## 2018-02-22 DIAGNOSIS — R51 Headache: Secondary | ICD-10-CM | POA: Diagnosis not present

## 2018-02-22 DIAGNOSIS — Z5321 Procedure and treatment not carried out due to patient leaving prior to being seen by health care provider: Secondary | ICD-10-CM | POA: Insufficient documentation

## 2018-02-22 DIAGNOSIS — R110 Nausea: Secondary | ICD-10-CM | POA: Insufficient documentation

## 2018-02-22 NOTE — ED Triage Notes (Signed)
Had bug bite to back of left knee Friday. States Saturday started having headaches, dizziness, nausea. Denies v/d. Nad. Non febrile. Steady gait

## 2018-02-23 NOTE — ED Notes (Signed)
Follow up call made  No answer 02/23/18  1154 s Joshu Furukawa rn

## 2018-03-01 ENCOUNTER — Encounter (HOSPITAL_COMMUNITY): Payer: Self-pay | Admitting: Emergency Medicine

## 2018-03-01 ENCOUNTER — Other Ambulatory Visit: Payer: Self-pay

## 2018-03-01 ENCOUNTER — Emergency Department (HOSPITAL_COMMUNITY)
Admission: EM | Admit: 2018-03-01 | Discharge: 2018-03-01 | Disposition: A | Discharge status: Home / Self Care | Payer: Medicaid Other | Attending: Emergency Medicine | Admitting: Emergency Medicine

## 2018-03-01 DIAGNOSIS — M5432 Sciatica, left side: Secondary | ICD-10-CM

## 2018-03-01 DIAGNOSIS — F17210 Nicotine dependence, cigarettes, uncomplicated: Secondary | ICD-10-CM | POA: Insufficient documentation

## 2018-03-01 DIAGNOSIS — M545 Low back pain: Secondary | ICD-10-CM | POA: Diagnosis present

## 2018-03-01 DIAGNOSIS — Z79899 Other long term (current) drug therapy: Secondary | ICD-10-CM | POA: Diagnosis not present

## 2018-03-01 MED ORDER — PREDNISONE 10 MG PO TABS: 40.0000 mg | ORAL_TABLET | Freq: Every day | ORAL | 0 refills | Status: AC

## 2018-03-01 NOTE — Discharge Instructions (Signed)
Given your symptoms and physical exam findings, I suspect you have sciatic nerve inflammation and possibly muscular spasm We will treat your inflammation with the following medication regimen: Prednisone 40 mg daily x 5 days Robaxin 500 mg every 8 hours x 5 days ITylenol 1000 mg every 8 hours for the next 5 days Heating pad as needed Over the counter lidocaine patches (salonpas) can be helpful  Avoid any exacerbating activities for the next 48 hours.  After 48 hours, start doing light back range of motion exercises and walking to avoid worsening back stiffness.   Return for fevers, chills, abdominal pain, changes in bowel movement, urinary symptoms, groin numbness, loss of bladder or bowel control, numbness weakness or heaviness to your extremities, rash.

## 2018-03-01 NOTE — ED Triage Notes (Signed)
Patient complaining of lower back pain radiating down bilateral legs x 5 days. States [facility] and was given muscle relaxer and pain patch.

## 2018-03-01 NOTE — ED Provider Notes (Signed)
[facility] Provider Note   CSN: [id number] Arrival date & time: 03/01/18  0941     History   Chief Complaint Chief Complaint  Patient presents with  . Back Pain    HPI Val[facility] is a 32 y.o. female here for evaluation of back pain.  Onset 5 days ago, constant, severe.  Worse with sitting down or laying down for prolonged periods of time.  Pain came on suddenly while she was sitting down.  Localized to the middle of the low back and moves to the left side and radiates down to the posterior left leg including the foot.  Described as stabbing sensation.  [facility] Saturday was told it was her sciatic nerve, discharged with muscle relaxers which have not provided relief.  Additionally has been taking Tylenol.  No recent falls, trauma.  No previous history of back surgeries.  No saddle anesthesia, loss of control of bladder or bowel, loss of sensation or weakness to extremities, fevers, chills, abdominal pain, urinary symptoms, changes in bowel movements.  HPI  Past Medical History:  Diagnosis Date  . Anxiety   . Bradycardia    pt says was [facility] for [facility] but was never put on any medication for it.  . Chronic pelvic pain in female   . Drug-seeking behavior 2015  . Ovarian cyst   . Renal disorder    kidney stone    There are no active problems to display for this patient.   Past Surgical History:  Procedure Laterality Date  . APPENDECTOMY    . CESAREAN SECTION     x2  . CHOLECYSTECTOMY N/A 03/03/2013   Procedure: LAPAROSCOPIC CHOLECYSTECTOMY;  Surgeon: Dalia HeadingMark A Jenkins, MD;  Location: [facility];  Service: General;  Laterality: N/A;  . CHOLECYSTECTOMY  2014     OB History    Gravida  4   Para  3   Term  3   Preterm      AB      Living  3     SAB      TAB      Ectopic      Multiple      Live Births               Home Medications    Prior to Admission medications   Medication Sig Start Date End  Date Taking? Authorizing Provider  acetaminophen (TYLENOL) 500 MG tablet Take 1,000 mg by mouth every 6 (six) hours as needed.    [provider]  cyclobenzaprine (FLEXERIL) 10 MG tablet Take 1 tablet (10 mg total) by mouth 3 (three) times daily. 11/02/17   Ivery QualeBryant, Hobson, PA-C  dexamethasone (DECADRON) 4 MG tablet Take 1 tablet (4 mg total) by mouth 2 (two) times daily with a meal. 11/02/17   Ivery QualeBryant, Hobson, PA-C  doxycycline (VIBRAMYCIN) 100 MG capsule Take 1 capsule (100 mg total) by mouth 2 (two) times daily. Patient not taking: Reported on 11/02/2017 06/20/17   Ivery QualeBryant, Hobson, PA-C  HYDROcodone-acetaminophen (NORCO/VICODIN) 5-325 MG tablet Take 1 tablet by mouth every 4 (four) hours as needed. Patient not taking: Reported on 11/02/2017 08/04/17   Ivery QualeBryant, Hobson, PA-C  predniSONE (DELTASONE) 10 MG tablet Take 4 tablets (40 mg total) by mouth daily for 5 days. 03/01/18 03/06/18  Liberty HandyGibbons, Angelo Caroll J, PA-C    Family History History reviewed. No pertinent family history.  Social History Social History   Tobacco Use  . Smoking  status: Current Every Day Smoker    Packs/day: 0.50    Years: 3.00    Pack years: 1.50    Types: Cigarettes  . Smokeless tobacco: Never Used  Substance Use Topics  . Alcohol use: No  . Drug use: No     Allergies   Gabapentin; Motrin [ibuprofen]; Naprosyn [naproxen]; and Tramadol   Review of Systems Review of Systems  Musculoskeletal: Positive for back pain and myalgias.  All other systems reviewed and are negative.    Physical Exam Updated Vital Signs BP 113/75 (BP Location: Right Arm)   Pulse 75   Temp 98 F (36.7 C) (Oral)   Resp 18   Ht 5\' 6"  (1.676 m)   Wt 99.8 kg (220 lb)   LMP  (LMP Unknown)   SpO2 100%   BMI 35.51 kg/m   Physical Exam  Constitutional: She is oriented to person, place, and time. She appears well-developed and well-nourished. No distress.  HENT:  Head: Normocephalic and atraumatic.  Nose: Nose normal.  Eyes: EOM  are normal.  Neck:  Full AROM of cervical spine without pain or rigidity No midline cervical spine tenderness No cervical paraspinal muscle tenderness or increased tone  Cardiovascular: Normal rate, S1 normal, S2 normal and normal heart sounds.  Pulses:      Radial pulses are 2+ on the right side, and 2+ on the left side.       Dorsalis pedis pulses are 2+ on the right side, and 2+ on the left side.  Pulmonary/Chest: Effort normal and breath sounds normal. She has no decreased breath sounds.  Abdominal: Soft. Normal appearance and bowel sounds are normal. There is no tenderness.  No suprapubic or CVA tenderness   Musculoskeletal: She exhibits tenderness.       Lumbar back: She exhibits tenderness and pain.  T-spine: no midline tenderness. No paraspinal muscle tenderness or increased tone.  L spine: no midline tenderness. TTP to left of mid line Pelvis:  TTP to left SI joint and sciatic notch. +SLR (left). +Faber (left).  No instability with AP/L compression. Full PROM hip bilaterally without reported pain  Neurological: She is alert and oriented to person, place, and time.  5/5 strength with flexion/extension of hip, knee and ankle, bilaterally.  Sensation to light touch intact in lower extremities including feet Knee DTR intact bilaterally  Skin: Skin is warm and dry. Capillary refill takes less than 2 seconds.  Psychiatric: She has a normal mood and affect. Her speech is normal and behavior is normal. Judgment and thought content normal. Cognition and memory are normal.  Nursing note and vitals reviewed.    ED Treatments / Results  Labs (all labs ordered are listed, but only abnormal results are displayed) Labs Reviewed - No data to display  EKG None  Radiology No results found.  Procedures Procedures (including critical care time)  Medications Ordered in ED Medications - No data to display   Initial Impression / Assessment and Plan / ED Course  I have reviewed the  triage vital signs and the nursing notes.  Pertinent labs & imaging results that were available during my care of the patient were reviewed by me and considered in my medical decision making (see chart for details).     Patient is a 32 y.o. female with back pain with radiation to left buttock and posterior leg leg and foot. Atramautic. Positive SLR and faber (left). No groin numbness.  Normal lower extremity neurological exam.  Given exam most likely  radiculopathy of sciatic nerve. Lower suspicion for vertebral compression fx, pyelonephritis, nephrolithiasis, epidural abscess, dissection, cauda equina.  No red flag symptoms of back pain including: saddle anesthesia, bladder/bowel incontinence or retention, fevers, abdominal pain, urinary symptoms, IVDU, recent trauma, overlaying rash, focal neuro deficits, UTI symptoms or CVAT, midline spinous process tenderness.  ED labs and imaging not indicated today as abdominal and MSK exam reassuring and no red flag symptoms present.  I do not suspect bony or intraabdominal/pelvic emergency at this time. Suspect pt experiencing muscular spasm/strain and possibly sciatic nerve inflammation. Conservative measures including prednisone, robaxin, high dose NSAIDs, lidocaine patches with PCP follow-up if no improvement with conservative management. ED return precautions discussed with pt who verbalized understanding and was agreeable with plan.  Final Clinical Impressions(s) / ED Diagnoses   Final diagnoses:  Sciatica of left side    ED Discharge Orders        Ordered    predniSONE (DELTASONE) 10 MG tablet  Daily     03/01/18 1200       Liberty Handy, [place] 03/01/18 1307    Bethann Berkshire, MD 03/01/18 1610

## 2018-03-01 NOTE — ED Notes (Signed)
Patient given discharge instruction, verbalized understand. Patient ambulatory out of the department.  

## 2018-03-20 ENCOUNTER — Encounter (HOSPITAL_COMMUNITY): Payer: Self-pay | Admitting: *Deleted

## 2018-03-20 ENCOUNTER — Emergency Department (HOSPITAL_COMMUNITY)
Admission: EM | Admit: 2018-03-20 | Discharge: 2018-03-20 | Disposition: A | Discharge status: Home / Self Care | Payer: Medicaid Other | Attending: Emergency Medicine | Admitting: Emergency Medicine

## 2018-03-20 DIAGNOSIS — G8929 Other chronic pain: Secondary | ICD-10-CM | POA: Diagnosis not present

## 2018-03-20 DIAGNOSIS — M545 Low back pain: Secondary | ICD-10-CM | POA: Diagnosis present

## 2018-03-20 DIAGNOSIS — F17210 Nicotine dependence, cigarettes, uncomplicated: Secondary | ICD-10-CM | POA: Insufficient documentation

## 2018-03-20 HISTORY — DX: Dorsalgia, unspecified: M54.9

## 2018-03-20 HISTORY — DX: Other chronic pain: G89.29

## 2018-03-20 MED ORDER — CYCLOBENZAPRINE HCL 10 MG PO TABS: 10.0000 mg | ORAL_TABLET | Freq: Two times a day (BID) | ORAL | 0 refills | Status: DC | PRN

## 2018-03-20 MED ORDER — LIDOCAINE 5 % EX PTCH: 1.0000 | MEDICATED_PATCH | CUTANEOUS | 0 refills | Status: DC

## 2018-03-20 MED ORDER — CYCLOBENZAPRINE HCL 10 MG PO TABS
10.0000 mg | ORAL_TABLET | Freq: Once | ORAL | Status: AC
  Administered 2018-03-20: 10 mg via ORAL
  Filled 2018-03-20: qty 1

## 2018-03-20 MED ORDER — IBUPROFEN 800 MG PO TABS
800.0000 mg | ORAL_TABLET | Freq: Once | ORAL | Status: AC
  Administered 2018-03-20: 800 mg via ORAL
  Filled 2018-03-20: qty 1

## 2018-03-20 NOTE — ED Provider Notes (Signed)
[facility] EMERGENCY DEPARTMENT Provider Note   CSN: [id number] Arrival date & time: 03/20/18  1512     History   Chief Complaint Chief Complaint  Patient presents with  . Back Pain    HPI Valerie Robinson is a 32 y.o. female.  HPI  32 year old female presents today complaining of low back pain that radiates to the left buttock and thigh.  It has been present for approximately 1 month.  She has been into the ED for times.  She has taken various medications with varying levels of relief.  Pain is worse with certain movements.  The lidocaine patches gave some relief.  Flexeril gave some relief.  Ibuprofen was not particularly effective.  She is currently having moderate pain.  She has noted no new injuries.  She denies any numbness, tingling, weakness, loss of bowel or bladder control.  Is currently out of all medications and has not taken anything recently for this  Past Medical History:  Diagnosis Date  . Anxiety   . Bradycardia    pt says was [facility] for [facility] but was never put on any medication for it.  . Chronic back pain   . Chronic pelvic pain in female   . Drug-seeking behavior 2015  . Ovarian cyst   . Renal disorder    kidney stone    There are no active problems to display for this patient.   Past Surgical History:  Procedure Laterality Date  . APPENDECTOMY    . CESAREAN SECTION     x2  . CHOLECYSTECTOMY N/A 03/03/2013   Procedure: LAPAROSCOPIC CHOLECYSTECTOMY;  Surgeon: Dalia HeadingMark A Jenkins, MD;  Location: [facility];  Service: General;  Laterality: N/A;  . CHOLECYSTECTOMY  2014     OB History    Gravida  4   Para  3   Term  3   Preterm      AB      Living  3     SAB      TAB      Ectopic      Multiple      Live Births               Home Medications    Prior to Admission medications   Medication Sig Start Date End Date Taking? Authorizing Provider  acetaminophen (TYLENOL) 500 MG tablet Take 1,000 mg by mouth every 6 (six)  hours as needed.    [provider]  cyclobenzaprine (FLEXERIL) 10 MG tablet Take 1 tablet (10 mg total) by mouth 2 (two) times daily as needed for muscle spasms. 03/20/18   Margarita Grizzleay, Snyder Colavito, MD  dexamethasone (DECADRON) 4 MG tablet Take 1 tablet (4 mg total) by mouth 2 (two) times daily with a meal. 11/02/17   Ivery QualeBryant, Hobson, PA-C  doxycycline (VIBRAMYCIN) 100 MG capsule Take 1 capsule (100 mg total) by mouth 2 (two) times daily. Patient not taking: Reported on 11/02/2017 06/20/17   Ivery QualeBryant, Hobson, PA-C  HYDROcodone-acetaminophen (NORCO/VICODIN) 5-325 MG tablet Take 1 tablet by mouth every 4 (four) hours as needed. Patient not taking: Reported on 11/02/2017 08/04/17   Ivery QualeBryant, Hobson, PA-C  lidocaine (LIDODERM) 5 % Place 1 patch onto the skin daily. Remove & Discard patch within 12 hours or as directed by MD 03/20/18   Margarita Grizzleay, Shaquira Moroz, MD    Family History History reviewed. No pertinent family history.  Social History Social History   Tobacco Use  . Smoking status: Current Every Day Smoker  Packs/day: 0.50    Years: 3.00    Pack years: 1.50    Types: Cigarettes  . Smokeless tobacco: Never Used  Substance Use Topics  . Alcohol use: No  . Drug use: No     Allergies   Gabapentin; Naprosyn [naproxen]; and Tramadol   Review of Systems Review of Systems  All other systems reviewed and are negative.    Physical Exam Updated Vital Signs BP 110/69 (BP Location: Right Arm)   Pulse 81   Temp 98.3 F (36.8 C) (Oral)   Resp 12   Ht 1.676 m (5\' 6" )   Wt 99.8 kg (220 lb)   LMP  (LMP Unknown)   SpO2 100%   BMI 35.51 kg/m   Physical Exam  Constitutional: She is oriented to person, place, and time. She appears well-developed and well-nourished. No distress.  HENT:  Head: Normocephalic and atraumatic.  Right Ear: External ear normal.  Left Ear: External ear normal.  Nose: Nose normal.  Eyes: Pupils are equal, round, and reactive to light. Conjunctivae and EOM are normal.    Neck: Normal range of motion. Neck supple.  Pulmonary/Chest: Effort normal.  Musculoskeletal: Normal range of motion. She exhibits tenderness.       Back:  Neurological: She is alert and oriented to person, place, and time. She displays normal reflexes. No sensory deficit. She exhibits normal muscle tone. Coordination normal.  Skin: Skin is warm and dry. Capillary refill takes less than 2 seconds.  Psychiatric: She has a normal mood and affect. Her behavior is normal. Thought content normal.  Nursing note and vitals reviewed.    ED Treatments / Results  Labs (all labs ordered are listed, but only abnormal results are displayed) Labs Reviewed - No data to display  EKG None  Radiology No results found.  Procedures Procedures (including critical care time)  Medications Ordered in ED Medications  ibuprofen (ADVIL,MOTRIN) tablet 800 mg (has no administration in time range)  cyclobenzaprine (FLEXERIL) tablet 10 mg (has no administration in time range)     Initial Impression / Assessment and Plan / ED Course  I have reviewed the triage vital signs and the nursing notes.  Pertinent labs & imaging results that were available during my care of the patient were reviewed by me and considered in my medical decision making (see chart for details).      Final Clinical Impressions(s) / ED Diagnoses   Final diagnoses:  Chronic left-sided low back pain, with sciatica presence unspecified    ED Discharge Orders        Ordered    cyclobenzaprine (FLEXERIL) 10 MG tablet  2 times daily PRN     03/20/18 1541    lidocaine (LIDODERM) 5 %  Every 24 hours     03/20/18 1541       Margarita Grizzle, MD 03/20/18 [phone]

## 2018-03-20 NOTE — ED Notes (Signed)
4th visit to ED this month for complaint of back pain  Pt complains of back pain  Ambulates heel to toe to room

## 2018-03-20 NOTE — ED Triage Notes (Signed)
Pt with continued lower back pain that radiates down left leg since before July 4th.  Has been seen here and at [facility] well for same.

## 2018-03-20 NOTE — ED Notes (Signed)
Pt reports bad back "for awhile"  Reports no physician

## 2018-05-23 ENCOUNTER — Emergency Department (HOSPITAL_COMMUNITY): Payer: Medicaid Other

## 2018-05-23 ENCOUNTER — Other Ambulatory Visit: Payer: Self-pay

## 2018-05-23 ENCOUNTER — Encounter (HOSPITAL_COMMUNITY): Payer: Self-pay | Admitting: Emergency Medicine

## 2018-05-23 ENCOUNTER — Emergency Department (HOSPITAL_COMMUNITY)
Admission: EM | Admit: 2018-05-23 | Discharge: 2018-05-23 | Disposition: A | Discharge status: Home / Self Care | Payer: Medicaid Other | Attending: Emergency Medicine | Admitting: Emergency Medicine

## 2018-05-23 DIAGNOSIS — Z79899 Other long term (current) drug therapy: Secondary | ICD-10-CM | POA: Diagnosis not present

## 2018-05-23 DIAGNOSIS — N83201 Unspecified ovarian cyst, right side: Secondary | ICD-10-CM | POA: Insufficient documentation

## 2018-05-23 DIAGNOSIS — F17210 Nicotine dependence, cigarettes, uncomplicated: Secondary | ICD-10-CM | POA: Insufficient documentation

## 2018-05-23 DIAGNOSIS — R102 Pelvic and perineal pain: Secondary | ICD-10-CM | POA: Diagnosis present

## 2018-05-23 LAB — URINALYSIS, ROUTINE W REFLEX MICROSCOPIC
Bilirubin Urine: NEGATIVE
Glucose, UA: NEGATIVE mg/dL
Hgb urine dipstick: NEGATIVE
Ketones, ur: NEGATIVE mg/dL
LEUKOCYTES UA: NEGATIVE
Nitrite: NEGATIVE
PROTEIN: NEGATIVE mg/dL
SPECIFIC GRAVITY, URINE: 1.004 — AB (ref 1.005–1.030)
pH: 7 (ref 5.0–8.0)

## 2018-05-23 LAB — WET PREP, GENITAL
CLUE CELLS WET PREP: NONE SEEN
Sperm: NONE SEEN
TRICH WET PREP: NONE SEEN
Yeast Wet Prep HPF POC: NONE SEEN

## 2018-05-23 LAB — PREGNANCY, URINE: PREG TEST UR: NEGATIVE

## 2018-05-23 MED ORDER — HYDROMORPHONE HCL 1 MG/ML IJ SOLN
1.0000 mg | Freq: Once | INTRAMUSCULAR | Status: AC
  Administered 2018-05-23: 1 mg via INTRAMUSCULAR
  Filled 2018-05-23: qty 1

## 2018-05-23 MED ORDER — HYDROMORPHONE HCL 1 MG/ML IJ SOLN: 1.0000 mg | Freq: Once | INTRAMUSCULAR | Status: DC

## 2018-05-23 MED ORDER — HYDROMORPHONE HCL 2 MG/ML IJ SOLN: 2.0000 mg | Freq: Once | INTRAMUSCULAR | Status: DC

## 2018-05-23 NOTE — ED Triage Notes (Signed)
Pt reports vaginal pain with pressure since yesterday. Denies urination symptoms. Denies nausea/vomiting.

## 2018-05-23 NOTE — Discharge Instructions (Addendum)
If you develop worsening abdominal pain, vomiting, fever, or any other new/concerning symptoms and return to the ER for evaluation.  Otherwise, follow-up with OB/GYN.  You may take ibuprofen and/or Tylenol for pain.

## 2018-05-23 NOTE — ED Notes (Signed)
Iv access attempted x2, one by [employer] and another by myself.  Meds requested to be changed to IM due to difficult access vs proceeding to ultrasound.

## 2018-05-23 NOTE — ED Notes (Signed)
Patient states that she has been bleeding very heavily for about 1 week now. She states that she normally does not have regular periods due to her Birth control (depo shot) but she is bleeding now. She states that at times she has to change tampons after about 10 min. She has a history of ovarian cysts she states and this feels very similar to ones she has had in the past. Tylenol has helped the pain some, but she is just concerned that she is still bleeding so heavily after so many days. Last dep shot received Apr 08, 2018.

## 2018-05-23 NOTE — ED Provider Notes (Signed)
[facility] EMERGENCY DEPARTMENT Provider Note   CSN: [id number] Arrival date & time: 05/23/18  1207     History   Chief Complaint Chief Complaint  Patient presents with  . Vaginal Pain    HPI Valerie Robinson is a 32 y.o. female.  HPI  32 year old female presents with lower abdominal pain.  She has been having on and off vaginal bleeding for about a week which is abnormal for her because she has not had a cycle in about 2 years due to the Depakote shot.  Yesterday started having lower abdominal pain that is across her entire lower abdomen but worse on her left.  Seems to be worsening and is constant.  No urinary symptoms.  No vaginal bleeding currently and no vaginal discharge or concern for STI. Pain is rated at an 8/10.  Past Medical History:  Diagnosis Date  . Anxiety   . Bradycardia    pt says was seen at [facility] for bradycardia but was never put on any medication for it.  . Chronic back pain   . Chronic pelvic pain in female   . Drug-seeking behavior 2015  . Ovarian cyst   . Renal disorder    kidney stone    There are no active problems to display for this patient.   Past Surgical History:  Procedure Laterality Date  . APPENDECTOMY    . CESAREAN SECTION     x2  . CHOLECYSTECTOMY N/A 03/03/2013   Procedure: LAPAROSCOPIC CHOLECYSTECTOMY;  Surgeon: Dalia Heading, MD;  Location: [facility];  Service: General;  Laterality: N/A;  . CHOLECYSTECTOMY  2014     OB History    Gravida  4   Para  3   Term  3   Preterm      AB      Living  3     SAB      TAB      Ectopic      Multiple      Live Births               Home Medications    Prior to Admission medications   Medication Sig Start Date End Date Taking? Authorizing Provider  acetaminophen (TYLENOL) 500 MG tablet Take 1,000 mg by mouth every 6 (six) hours as needed.    [provider]  cyclobenzaprine (FLEXERIL) 10 MG tablet Take 1 tablet (10 mg total) by mouth 2 (two) times daily as  needed for muscle spasms. 03/20/18   Margarita Grizzle, MD  dexamethasone (DECADRON) 4 MG tablet Take 1 tablet (4 mg total) by mouth 2 (two) times daily with a meal. 11/02/17   Ivery Quale, PA-C  doxycycline (VIBRAMYCIN) 100 MG capsule Take 1 capsule (100 mg total) by mouth 2 (two) times daily. Patient not taking: Reported on 11/02/2017 06/20/17   Ivery Quale, PA-C  HYDROcodone-acetaminophen (NORCO/VICODIN) 5-325 MG tablet Take 1 tablet by mouth every 4 (four) hours as needed. Patient not taking: Reported on 11/02/2017 08/04/17   Ivery Quale, PA-C  lidocaine (LIDODERM) 5 % Place 1 patch onto the skin daily. Remove & Discard patch within 12 hours or as directed by MD 03/20/18   Margarita Grizzle, MD    Family History History reviewed. No pertinent family history.  Social History Social History   Tobacco Use  . Smoking status: Current Every Day Smoker    Packs/day: 0.50    Years: 3.00    Pack years: 1.50    Types: Cigarettes  .  Smokeless tobacco: Never Used  Substance Use Topics  . Alcohol use: No  . Drug use: No     Allergies   Gabapentin; Naprosyn [naproxen]; and Tramadol   Review of Systems Review of Systems  Gastrointestinal: Positive for abdominal pain. Negative for vomiting.  Genitourinary: Positive for vaginal bleeding. Negative for dysuria and vaginal discharge.  All other systems reviewed and are negative.    Physical Exam Updated Vital Signs BP 107/61   Pulse (!) 55   Temp 97.9 F (36.6 C) (Oral)   Resp 17   Ht 5\' 6"  (1.676 m)   Wt 99.8 kg   SpO2 100%   BMI 35.51 kg/m   Physical Exam  Constitutional: She is oriented to person, place, and time. She appears well-developed and well-nourished. No distress.  obese  HENT:  Head: Normocephalic and atraumatic.  Right Ear: External ear normal.  Left Ear: External ear normal.  Nose: Nose normal.  Eyes: Right eye exhibits no discharge. Left eye exhibits no discharge.  Cardiovascular: Normal rate, regular rhythm  and normal heart sounds.  Pulmonary/Chest: Effort normal and breath sounds normal.  Abdominal: Soft. There is tenderness in the suprapubic area.    Genitourinary: Cervix exhibits no motion tenderness. No bleeding in the vagina. Vaginal discharge found.  Neurological: She is alert and oriented to person, place, and time.  Skin: Skin is warm and dry. She is not diaphoretic.  Nursing note and vitals reviewed.    ED Treatments / Results  Labs (all labs ordered are listed, but only abnormal results are displayed) Labs Reviewed  WET PREP, GENITAL - Abnormal; Notable for the following components:      Result Value   WBC, Wet Prep HPF POC MANY (*)    All other components within normal limits  URINALYSIS, ROUTINE W REFLEX MICROSCOPIC - Abnormal; Notable for the following components:   Color, Urine STRAW (*)    Specific Gravity, Urine 1.004 (*)    All other components within normal limits  PREGNANCY, URINE  GC/CHLAMYDIA PROBE AMP ([place]) NOT AT [facility]    EKG None  Radiology US Transvaginal Non-ob  Result Date: 05/23/2018 CLINICAL DATA:  Acute pelvic pain for the past week associated with heavy menstrual bleeding. History of 3 cesarean sections. Ovarian cysts. Onset of last normal menstrual period was May 16, 2018 EXAM: TRANSABDOMINAL AND TRANSVAGINAL ULTRASOUND OF PELVIS DOPPLER ULTRASOUND OF OVARIES TECHNIQUE: Both transabdominal and transvaginal ultrasound examinations of the pelvis were performed. Transabdominal technique was performed for global imaging of the pelvis including uterus, ovaries, adnexal regions, and pelvic cul-de-sac. It was necessary to proceed with endovaginal exam following the transabdominal exam to visualize the ovaries. Color and duplex Doppler ultrasound was utilized to evaluate blood flow to the ovaries. COMPARISON:  Pelvic ultrasound of March 05, 2017 FINDINGS: Uterus Measurements: 7.1 x 3.7 x 5.8 cm. The echotexture of the uterus is heterogeneous. No  discrete fibroids are observed. Endometrium Thickness: 4.1 mm.  There is fluid within the endometrial cavity. Right ovary Measurements: 3.3 x 2.6 x 3.4 cm. There is a right ovarian cyst measuring 2.5 x 1.7 x 2.7 cm. There is a partial septation present. Left ovary Measurements: 2.9 x 2.2 x 2.2 cm. The ovarian echotexture is normal. There are developing follicles. Pulsed Doppler evaluation of both ovaries demonstrates normal low-resistance arterial and venous waveforms. Other findings No abnormal free fluid. IMPRESSION: Partially septated cyst of the right ovary measuring 2.7 x 2.5 x 1.7 cm. Normal appearing left ovary. No free pelvic  fluid. Correlation with patient's beta HCG is needed. Ectopic pregnancy is felt less likely than a hemorrhagic cyst or early tubo-ovarian abscess. Heterogeneous uterine echotexture. Normal thickness of the endometrial stripe but fluid is present in the endometrial cavity. If bleeding remains unresponsive to hormonal or medical therapy, sonohysterogram should be considered for focal lesion work-up. (Ref: Radiological Reasoning: Algorithmic Workup of Abnormal Vaginal Bleeding with Endovaginal Sonography and Sonohysterography. AJR 2008; 161:W96-04) Electronically Signed   By: David  [place] M.D.   On: 05/23/2018 14:56   US Pelvis Complete  Result Date: 05/23/2018 CLINICAL DATA:  Acute pelvic pain for the past week associated with heavy menstrual bleeding. History of 3 cesarean sections. Ovarian cysts. Onset of last normal menstrual period was May 16, 2018 EXAM: TRANSABDOMINAL AND TRANSVAGINAL ULTRASOUND OF PELVIS DOPPLER ULTRASOUND OF OVARIES TECHNIQUE: Both transabdominal and transvaginal ultrasound examinations of the pelvis were performed. Transabdominal technique was performed for global imaging of the pelvis including uterus, ovaries, adnexal regions, and pelvic cul-de-sac. It was necessary to proceed with endovaginal exam following the transabdominal exam to visualize the  ovaries. Color and duplex Doppler ultrasound was utilized to evaluate blood flow to the ovaries. COMPARISON:  Pelvic ultrasound of March 05, 2017 FINDINGS: Uterus Measurements: 7.1 x 3.7 x 5.8 cm. The echotexture of the uterus is heterogeneous. No discrete fibroids are observed. Endometrium Thickness: 4.1 mm.  There is fluid within the endometrial cavity. Right ovary Measurements: 3.3 x 2.6 x 3.4 cm. There is a right ovarian cyst measuring 2.5 x 1.7 x 2.7 cm. There is a partial septation present. Left ovary Measurements: 2.9 x 2.2 x 2.2 cm. The ovarian echotexture is normal. There are developing follicles. Pulsed Doppler evaluation of both ovaries demonstrates normal low-resistance arterial and venous waveforms. Other findings No abnormal free fluid. IMPRESSION: Partially septated cyst of the right ovary measuring 2.7 x 2.5 x 1.7 cm. Normal appearing left ovary. No free pelvic fluid. Correlation with patient's beta HCG is needed. Ectopic pregnancy is felt less likely than a hemorrhagic cyst or early tubo-ovarian abscess. Heterogeneous uterine echotexture. Normal thickness of the endometrial stripe but fluid is present in the endometrial cavity. If bleeding remains unresponsive to hormonal or medical therapy, sonohysterogram should be considered for focal lesion work-up. (Ref: Radiological Reasoning: Algorithmic Workup of Abnormal Vaginal Bleeding with Endovaginal Sonography and Sonohysterography. AJR 2008; 540:J81-19) Electronically Signed   By: David  [place] M.D.   On: 05/23/2018 14:56   [place] Art/ven Flow Abd Pelv Doppler  Result Date: 05/23/2018 CLINICAL DATA:  Acute pelvic pain for the past week associated with heavy menstrual bleeding. History of 3 cesarean sections. Ovarian cysts. Onset of last normal menstrual period was May 16, 2018 EXAM: TRANSABDOMINAL AND TRANSVAGINAL ULTRASOUND OF PELVIS DOPPLER ULTRASOUND OF OVARIES TECHNIQUE: Both transabdominal and transvaginal ultrasound examinations of the  pelvis were performed. Transabdominal technique was performed for global imaging of the pelvis including uterus, ovaries, adnexal regions, and pelvic cul-de-sac. It was necessary to proceed with endovaginal exam following the transabdominal exam to visualize the ovaries. Color and duplex Doppler ultrasound was utilized to evaluate blood flow to the ovaries. COMPARISON:  Pelvic ultrasound of March 05, 2017 FINDINGS: Uterus Measurements: 7.1 x 3.7 x 5.8 cm. The echotexture of the uterus is heterogeneous. No discrete fibroids are observed. Endometrium Thickness: 4.1 mm.  There is fluid within the endometrial cavity. Right ovary Measurements: 3.3 x 2.6 x 3.4 cm. There is a right ovarian cyst measuring 2.5 x 1.7 x 2.7 cm. There is a partial septation present.  Left ovary Measurements: 2.9 x 2.2 x 2.2 cm. The ovarian echotexture is normal. There are developing follicles. Pulsed Doppler evaluation of both ovaries demonstrates normal low-resistance arterial and venous waveforms. Other findings No abnormal free fluid. IMPRESSION: Partially septated cyst of the right ovary measuring 2.7 x 2.5 x 1.7 cm. Normal appearing left ovary. No free pelvic fluid. Correlation with patient's beta HCG is needed. Ectopic pregnancy is felt less likely than a hemorrhagic cyst or early tubo-ovarian abscess. Heterogeneous uterine echotexture. Normal thickness of the endometrial stripe but fluid is present in the endometrial cavity. If bleeding remains unresponsive to hormonal or medical therapy, sonohysterogram should be considered for focal lesion work-up. (Ref: Radiological Reasoning: Algorithmic Workup of Abnormal Vaginal Bleeding with Endovaginal Sonography and Sonohysterography. AJR 2008; 161:W96-04) Electronically Signed   By: David  [place] M.D.   On: 05/23/2018 14:56    Procedures Procedures (including critical care time)  Medications Ordered in ED Medications  HYDROmorphone (DILAUDID) injection 1 mg (1 mg Intramuscular Given  05/23/18 1513)     Initial Impression / Assessment and Plan / ED Course  I have reviewed the triage vital signs and the nursing notes.  Pertinent labs & imaging results that were available during my care of the patient were reviewed by me and considered in my medical decision making (see chart for details).     Most likely the patient's lower abdominal pain is coming from the cyst.  She is not pregnant.  I think tubo-ovarian abscess is unlikely given no infectious signs or symptoms.  While she does have a little bit of discharge, STI concern is pretty low and she has no signs or symptoms of PID on exam.  Thus I will recommend she continue ibuprofen as well as Tylenol.  We will give her 1 dose of IM Dilaudid for her pain but she overall does not appear in severe pain.  No evidence of torsion.  Follow-up with OB/GYN.  Final Clinical Impressions(s) / ED Diagnoses   Final diagnoses:  Right ovarian cyst    ED Discharge Orders    None       Pricilla Loveless, MD 05/23/18 [phone]

## 2018-05-24 ENCOUNTER — Emergency Department (HOSPITAL_COMMUNITY)
Admission: EM | Admit: 2018-05-24 | Discharge: 2018-05-24 | Disposition: A | Discharge status: Home / Self Care | Payer: Medicaid Other | Attending: Emergency Medicine | Admitting: Emergency Medicine

## 2018-05-24 ENCOUNTER — Encounter (HOSPITAL_COMMUNITY): Payer: Self-pay | Admitting: Emergency Medicine

## 2018-05-24 ENCOUNTER — Ambulatory Visit: Payer: Medicaid Other | Admitting: Obstetrics & Gynecology

## 2018-05-24 ENCOUNTER — Other Ambulatory Visit: Payer: Self-pay

## 2018-05-24 ENCOUNTER — Encounter: Payer: Self-pay | Admitting: Obstetrics & Gynecology

## 2018-05-24 VITALS — BP 105/70 | HR 57 | Ht 66.0 in | Wt 198.3 lb

## 2018-05-24 DIAGNOSIS — F17210 Nicotine dependence, cigarettes, uncomplicated: Secondary | ICD-10-CM | POA: Insufficient documentation

## 2018-05-24 DIAGNOSIS — N83201 Unspecified ovarian cyst, right side: Secondary | ICD-10-CM | POA: Insufficient documentation

## 2018-05-24 DIAGNOSIS — R1030 Lower abdominal pain, unspecified: Secondary | ICD-10-CM | POA: Diagnosis present

## 2018-05-24 LAB — GC/CHLAMYDIA PROBE AMP (~~LOC~~) NOT AT ARMC
CHLAMYDIA, DNA PROBE: NEGATIVE
NEISSERIA GONORRHEA: NEGATIVE

## 2018-05-24 LAB — COMPREHENSIVE METABOLIC PANEL
ALT: 33 U/L (ref 0–44)
AST: 23 U/L (ref 15–41)
Albumin: 4.4 g/dL (ref 3.5–5.0)
Alkaline Phosphatase: 76 U/L (ref 38–126)
Anion gap: 11 (ref 5–15)
BUN: 8 mg/dL (ref 6–20)
CO2: 21 mmol/L — ABNORMAL LOW (ref 22–32)
Calcium: 9.1 mg/dL (ref 8.9–10.3)
Chloride: 102 mmol/L (ref 98–111)
Creatinine, Ser: 0.62 mg/dL (ref 0.44–1.00)
GFR calc Af Amer: >60 mL/min (ref >60)
GFR calc non Af Amer: >60 mL/min (ref >60)
Glucose, Bld: 118 mg/dL — ABNORMAL HIGH (ref 70–99)
Potassium: 3.2 mmol/L — ABNORMAL LOW (ref 3.5–5.1)
Sodium: 134 mmol/L — ABNORMAL LOW (ref 135–145)
Total Bilirubin: 0.8 mg/dL (ref 0.3–1.2)
Total Protein: 8.4 g/dL — ABNORMAL HIGH (ref 6.5–8.1)

## 2018-05-24 LAB — CBC
HCT: 45.1 % (ref 36.0–46.0)
Hemoglobin: 15.4 g/dL — ABNORMAL HIGH (ref 12.0–15.0)
MCH: 33.9 pg (ref 26.0–34.0)
MCHC: 34.1 g/dL (ref 30.0–36.0)
MCV: 99.3 fL (ref 78.0–100.0)
Platelets: 292 10*3/uL (ref 150–400)
RBC: 4.54 MIL/uL (ref 3.87–5.11)
RDW: 12.4 % (ref 11.5–15.5)
WBC: 10.5 10*3/uL (ref 4.0–10.5)

## 2018-05-24 MED ORDER — HYDROCODONE-ACETAMINOPHEN 5-325 MG PO TABS
1.0000 | ORAL_TABLET | Freq: Once | ORAL | Status: AC
  Administered 2018-05-24: 1 via ORAL
  Filled 2018-05-24: qty 1

## 2018-05-24 MED ORDER — HYDROCODONE-ACETAMINOPHEN 5-325 MG PO TABS: ORAL_TABLET | ORAL | 0 refills | Status: DC

## 2018-05-24 NOTE — ED Triage Notes (Signed)
Lower abd pain that radiates through vagina.  Seen yesterday for same problem.  State she is not getting better.  Reports heavy vaginal bleeding, has used x 3 tampons in past hour.

## 2018-05-24 NOTE — Progress Notes (Signed)
Chief Complaint  Patient presents with  . new gyn    seem ER- rt ovarian cyst/ ultrasound done      32 y.o. [id number] Patient's last menstrual period was 05/06/2018. The current method of family planning is none.  Outpatient Encounter Medications as of 05/24/2018  Medication Sig  . acetaminophen (TYLENOL) 500 MG tablet Take 1,000 mg by mouth every 6 (six) hours as needed.  . cyclobenzaprine (FLEXERIL) 10 MG tablet Take 1 tablet (10 mg total) by mouth 2 (two) times daily as needed for muscle spasms. (Patient not taking: Reported on 05/24/2018)  . dexamethasone (DECADRON) 4 MG tablet Take 1 tablet (4 mg total) by mouth 2 (two) times daily with a meal. (Patient not taking: Reported on 05/24/2018)  . doxycycline (VIBRAMYCIN) 100 MG capsule Take 1 capsule (100 mg total) by mouth 2 (two) times daily. (Patient not taking: Reported on 11/02/2017)  . HYDROcodone-acetaminophen (NORCO/VICODIN) 5-325 MG tablet Take 1 tablet by mouth every 4 (four) hours as needed. (Patient not taking: Reported on 11/02/2017)  . lidocaine (LIDODERM) 5 % Place 1 patch onto the skin daily. Remove & Discard patch within 12 hours or as directed by MD (Patient not taking: Reported on 05/24/2018)   No facility-administered encounter medications on file as of 05/24/2018.     Subjective Pt seen in ED for RLQ pain,  Labs normal Sonogram 2.5 cm right ovarian cyst, no other findings Past Medical History:  Diagnosis Date  . Anxiety   . Bradycardia    pt says was seen at [facility] for bradycardia but was never put on any medication for it.  . Chronic back pain   . Chronic pelvic pain in female   . Drug-seeking behavior 2015  . Ovarian cyst   . Renal disorder    kidney stone    Past Surgical History:  Procedure Laterality Date  . APPENDECTOMY    . CESAREAN SECTION     x2  . CHOLECYSTECTOMY N/A 03/03/2013   Procedure: LAPAROSCOPIC CHOLECYSTECTOMY;  Surgeon: Dalia Heading, MD;  Location: [facility];  Service: General;   Laterality: N/A;  . CHOLECYSTECTOMY  2014    OB History    Gravida  4   Para  3   Term  3   Preterm      AB      Living  3     SAB      TAB      Ectopic      Multiple      Live Births              Allergies  Allergen Reactions  . Gabapentin Rash  . Naprosyn [Naproxen] Hives, Nausea And Vomiting and Rash  . Tramadol Rash    Social History   Socioeconomic History  . Marital status: Single    Spouse name: Not on file  . Number of children: Not on file  . Years of education: Not on file  . Highest education level: Not on file  Occupational History  . Not on file  Social Needs  . Financial resource strain: Not on file  . Food insecurity:    Worry: Not on file    Inability: Not on file  . Transportation needs:    Medical: Not on file    Non-medical: Not on file  Tobacco Use  . Smoking status: Current Every Day Smoker    Packs/day: 0.50    Years: 3.00    Pack years: 1.50  Types: Cigarettes  . Smokeless tobacco: Never Used  Substance and Sexual Activity  . Alcohol use: No  . Drug use: No  . Sexual activity: Yes    Birth control/protection: None  Lifestyle  . Physical activity:    Days per week: Not on file    Minutes per session: Not on file  . Stress: Not on file  Relationships  . Social connections:    Talks on phone: Not on file    Gets together: Not on file    Attends religious service: Not on file    Active member of club or organization: Not on file    Attends meetings of clubs or organizations: Not on file    Relationship status: Not on file  Other Topics Concern  . Not on file  Social History Narrative  . Not on file    History reviewed. No pertinent family history.  Medications:       Current Outpatient Medications:  .  acetaminophen (TYLENOL) 500 MG tablet, Take 1,000 mg by mouth every 6 (six) hours as needed., Disp: , Rfl:  .  cyclobenzaprine (FLEXERIL) 10 MG tablet, Take 1 tablet (10 mg total) by mouth 2 (two) times  daily as needed for muscle spasms. (Patient not taking: Reported on 05/24/2018), Disp: 20 tablet, Rfl: 0 .  dexamethasone (DECADRON) 4 MG tablet, Take 1 tablet (4 mg total) by mouth 2 (two) times daily with a meal. (Patient not taking: Reported on 05/24/2018), Disp: 10 tablet, Rfl: 0 .  doxycycline (VIBRAMYCIN) 100 MG capsule, Take 1 capsule (100 mg total) by mouth 2 (two) times daily. (Patient not taking: Reported on 11/02/2017), Disp: 14 capsule, Rfl: 0 .  HYDROcodone-acetaminophen (NORCO/VICODIN) 5-325 MG tablet, Take 1 tablet by mouth every 4 (four) hours as needed. (Patient not taking: Reported on 11/02/2017), Disp: 12 tablet, Rfl: 0 .  lidocaine (LIDODERM) 5 %, Place 1 patch onto the skin daily. Remove & Discard patch within 12 hours or as directed by MD (Patient not taking: Reported on 05/24/2018), Disp: 30 patch, Rfl: 0  Objective Blood pressure 105/70, pulse (!) 57, height 5\' 6"  (1.676 m), weight 198 lb 4.8 oz (89.9 kg), last menstrual period 05/06/2018, unknown if currently breastfeeding.  Gen WDWN NAD  Pertinent ROS No burning with urination, frequency or urgency No nausea, vomiting or diarrhea Nor fever chills or other constitutional symptoms   Labs or studies Reviewed all labs and studies from ED    Impression Diagnoses this Encounter::   ICD-10-CM   1. Cyst of right ovary N83.201     Established relevant diagnosis(es): Right ovarian cyst seen on sonogram yesterday in ED  Plan/Recommendations: No orders of the defined types were placed in this encounter.   Labs or Scans Ordered: No orders of the defined types were placed in this encounter.   Management:: >physiologic right ovarian cyst, not hemorrhagic or any torsion No therapy needed, she is midcycle, should resolve with her menses Use OTC NSAIDs or tylenol if needed  Follow up Return if symptoms worsen or fail to improve.        Face to face time:  15 minutes  Greater than 50% of the visit time was  spent in counseling and coordination of care with the patient.  The summary and outline of the counseling and care coordination is summarized in the note above.   All questions were answered.

## 2018-05-24 NOTE — ED Provider Notes (Signed)
[facility] EMERGENCY DEPARTMENT Provider Note   CSN: [id number] Arrival date & time: 05/24/18  1612     History   Chief Complaint Chief Complaint  Patient presents with  . Abdominal Pain    HPI Valerie Robinson is a 32 y.o. female.  HPI   Valerie Robinson is a 32 y.o. female who was seen here on the day prior to arrival and evaluated for lower abdominal pain and intermittent vaginal bleeding.  She returns this evening stating that her pain is worse she is requesting pain control.  Her work-up yesterday included an ultrasound which showed a right ovarian cyst.  She [facility] up today [facility] he was advised to take over-the-counter pain relievers which she states are not helping.  She also states that she began bleeding again around 3 AM this morning and is changing her tampon every 3 hours.  She reports intermittent small clots of blood from her vagina.  She describes the abdominal pain as cramping and across her lower abdomen.  She denies back pain, fever, dysuria, vomiting, or vaginal discharge.   Past Medical History:  Diagnosis Date  . Anxiety   . Bradycardia    pt says was seen at [facility] for bradycardia but was never put on any medication for it.  . Chronic back pain   . Chronic pelvic pain in female   . Drug-seeking behavior 2015  . Ovarian cyst   . Renal disorder    kidney stone    There are no active problems to display for this patient.   Past Surgical History:  Procedure Laterality Date  . APPENDECTOMY    . CESAREAN SECTION     x2  . CHOLECYSTECTOMY N/A 03/03/2013   Procedure: LAPAROSCOPIC CHOLECYSTECTOMY;  Surgeon: Dalia Heading, MD;  Location: [facility];  Service: General;  Laterality: N/A;  . CHOLECYSTECTOMY  2014     OB History    Gravida  4   Para  3   Term  3   Preterm      AB      Living  3     SAB      TAB      Ectopic      Multiple      Live Births               Home Medications    Prior to Admission  medications   Medication Sig Start Date End Date Taking? Authorizing Provider  acetaminophen (TYLENOL) 500 MG tablet Take 1,000 mg by mouth every 6 (six) hours as needed.    [provider]  cyclobenzaprine (FLEXERIL) 10 MG tablet Take 1 tablet (10 mg total) by mouth 2 (two) times daily as needed for muscle spasms. Patient not taking: Reported on 05/24/2018 03/20/18   Margarita Grizzle, MD  dexamethasone (DECADRON) 4 MG tablet Take 1 tablet (4 mg total) by mouth 2 (two) times daily with a meal. Patient not taking: Reported on 05/24/2018 11/02/17   Ivery Quale, PA-C  doxycycline (VIBRAMYCIN) 100 MG capsule Take 1 capsule (100 mg total) by mouth 2 (two) times daily. Patient not taking: Reported on 11/02/2017 06/20/17   Ivery Quale, PA-C  HYDROcodone-acetaminophen (NORCO/VICODIN) 5-325 MG tablet Take 1 tablet by mouth every 4 (four) hours as needed. Patient not taking: Reported on 11/02/2017 08/04/17   Ivery Quale, PA-C  lidocaine (LIDODERM) 5 % Place 1 patch onto the skin daily. Remove & Discard patch within 12 hours or  as directed by MD Patient not taking: Reported on 05/24/2018 03/20/18   Margarita Grizzle, MD    Family History No family history on file.  Social History Social History   Tobacco Use  . Smoking status: Current Every Day Smoker    Packs/day: 0.50    Years: 3.00    Pack years: 1.50    Types: Cigarettes  . Smokeless tobacco: Never Used  Substance Use Topics  . Alcohol use: No  . Drug use: No     Allergies   Gabapentin; Naprosyn [naproxen]; and Tramadol   Review of Systems Review of Systems  Constitutional: Negative for appetite change, chills and fever.  Respiratory: Negative for shortness of breath.   Cardiovascular: Negative for chest pain.  Gastrointestinal: Positive for abdominal pain (Lower abdominal pain). Negative for blood in stool, nausea and vomiting.  Genitourinary: Positive for pelvic pain and vaginal bleeding. Negative for decreased urine volume,  difficulty urinating, dysuria, flank pain and vaginal discharge.  Musculoskeletal: Negative for back pain.  Skin: Negative for color change and rash.  Neurological: Negative for dizziness, weakness and numbness.  Hematological: Negative for adenopathy.     Physical Exam Updated Vital Signs BP 131/69 (BP Location: Right Arm)   Pulse (!) 54   Temp 98.2 F (36.8 C) (Oral)   Resp 18   Ht 5\' 6"  (1.676 m)   Wt 89.8 kg   LMP 05/24/2018   SpO2 100%   BMI 31.96 kg/m   Physical Exam  Constitutional: She appears well-nourished. She does not appear ill.  HENT:  Mouth/Throat: Oropharynx is clear and moist.  Cardiovascular: Normal rate, regular rhythm and normal heart sounds.  Pulmonary/Chest: Effort normal and breath sounds normal. No respiratory distress.  Abdominal: Soft. She exhibits no distension and no mass. There is tenderness (Mild diffuse tenderness along the lower abdomen.  No guarding or rebound tenderness.). There is no guarding.  Musculoskeletal: Normal range of motion.  Neurological: She is alert.  Skin: Skin is warm. No rash noted.  Psychiatric: She has a normal mood and affect.  Nursing note and vitals reviewed.    ED Treatments / Results  Labs (all labs ordered are listed, but only abnormal results are displayed) Labs Reviewed  COMPREHENSIVE METABOLIC PANEL - Abnormal; Notable for the following components:      Result Value   Sodium 134 (*)    Potassium 3.2 (*)    CO2 21 (*)    Glucose, Bld 118 (*)    Total Protein 8.4 (*)    All other components within normal limits  CBC - Abnormal; Notable for the following components:   Hemoglobin 15.4 (*)    All other components within normal limits    EKG None  Radiology US Transvaginal Non-ob  Result Date: 05/23/2018 CLINICAL DATA:  Acute pelvic pain for the past week associated with heavy menstrual bleeding. History of 3 cesarean sections. Ovarian cysts. Onset of last normal menstrual period was May 16, 2018  EXAM: TRANSABDOMINAL AND TRANSVAGINAL ULTRASOUND OF PELVIS DOPPLER ULTRASOUND OF OVARIES TECHNIQUE: Both transabdominal and transvaginal ultrasound examinations of the pelvis were performed. Transabdominal technique was performed for global imaging of the pelvis including uterus, ovaries, adnexal regions, and pelvic cul-de-sac. It was necessary to proceed with endovaginal exam following the transabdominal exam to visualize the ovaries. Color and duplex Doppler ultrasound was utilized to evaluate blood flow to the ovaries. COMPARISON:  Pelvic ultrasound of March 05, 2017 FINDINGS: Uterus Measurements: 7.1 x 3.7 x 5.8 cm. The echotexture of  the uterus is heterogeneous. No discrete fibroids are observed. Endometrium Thickness: 4.1 mm.  There is fluid within the endometrial cavity. Right ovary Measurements: 3.3 x 2.6 x 3.4 cm. There is a right ovarian cyst measuring 2.5 x 1.7 x 2.7 cm. There is a partial septation present. Left ovary Measurements: 2.9 x 2.2 x 2.2 cm. The ovarian echotexture is normal. There are developing follicles. Pulsed Doppler evaluation of both ovaries demonstrates normal low-resistance arterial and venous waveforms. Other findings No abnormal free fluid. IMPRESSION: Partially septated cyst of the right ovary measuring 2.7 x 2.5 x 1.7 cm. Normal appearing left ovary. No free pelvic fluid. Correlation with patient's beta HCG is needed. Ectopic pregnancy is felt less likely than a hemorrhagic cyst or early tubo-ovarian abscess. Heterogeneous uterine echotexture. Normal thickness of the endometrial stripe but fluid is present in the endometrial cavity. If bleeding remains unresponsive to hormonal or medical therapy, sonohysterogram should be considered for focal lesion work-up. (Ref: Radiological Reasoning: Algorithmic Workup of Abnormal Vaginal Bleeding with Endovaginal Sonography and Sonohysterography. AJR 2008; 213:Y86-57) Electronically Signed   By: David  [place] M.D.   On: 05/23/2018 14:56   US  Pelvis Complete  Result Date: 05/23/2018 CLINICAL DATA:  Acute pelvic pain for the past week associated with heavy menstrual bleeding. History of 3 cesarean sections. Ovarian cysts. Onset of last normal menstrual period was May 16, 2018 EXAM: TRANSABDOMINAL AND TRANSVAGINAL ULTRASOUND OF PELVIS DOPPLER ULTRASOUND OF OVARIES TECHNIQUE: Both transabdominal and transvaginal ultrasound examinations of the pelvis were performed. Transabdominal technique was performed for global imaging of the pelvis including uterus, ovaries, adnexal regions, and pelvic cul-de-sac. It was necessary to proceed with endovaginal exam following the transabdominal exam to visualize the ovaries. Color and duplex Doppler ultrasound was utilized to evaluate blood flow to the ovaries. COMPARISON:  Pelvic ultrasound of March 05, 2017 FINDINGS: Uterus Measurements: 7.1 x 3.7 x 5.8 cm. The echotexture of the uterus is heterogeneous. No discrete fibroids are observed. Endometrium Thickness: 4.1 mm.  There is fluid within the endometrial cavity. Right ovary Measurements: 3.3 x 2.6 x 3.4 cm. There is a right ovarian cyst measuring 2.5 x 1.7 x 2.7 cm. There is a partial septation present. Left ovary Measurements: 2.9 x 2.2 x 2.2 cm. The ovarian echotexture is normal. There are developing follicles. Pulsed Doppler evaluation of both ovaries demonstrates normal low-resistance arterial and venous waveforms. Other findings No abnormal free fluid. IMPRESSION: Partially septated cyst of the right ovary measuring 2.7 x 2.5 x 1.7 cm. Normal appearing left ovary. No free pelvic fluid. Correlation with patient's beta HCG is needed. Ectopic pregnancy is felt less likely than a hemorrhagic cyst or early tubo-ovarian abscess. Heterogeneous uterine echotexture. Normal thickness of the endometrial stripe but fluid is present in the endometrial cavity. If bleeding remains unresponsive to hormonal or medical therapy, sonohysterogram should be considered for focal  lesion work-up. (Ref: Radiological Reasoning: Algorithmic Workup of Abnormal Vaginal Bleeding with Endovaginal Sonography and Sonohysterography. AJR 2008; 846:N62-95) Electronically Signed   By: David  [place] M.D.   On: 05/23/2018 14:56   [place] Art/ven Flow Abd Pelv Doppler  Result Date: 05/23/2018 CLINICAL DATA:  Acute pelvic pain for the past week associated with heavy menstrual bleeding. History of 3 cesarean sections. Ovarian cysts. Onset of last normal menstrual period was May 16, 2018 EXAM: TRANSABDOMINAL AND TRANSVAGINAL ULTRASOUND OF PELVIS DOPPLER ULTRASOUND OF OVARIES TECHNIQUE: Both transabdominal and transvaginal ultrasound examinations of the pelvis were performed. Transabdominal technique was performed for global imaging of the  pelvis including uterus, ovaries, adnexal regions, and pelvic cul-de-sac. It was necessary to proceed with endovaginal exam following the transabdominal exam to visualize the ovaries. Color and duplex Doppler ultrasound was utilized to evaluate blood flow to the ovaries. COMPARISON:  Pelvic ultrasound of March 05, 2017 FINDINGS: Uterus Measurements: 7.1 x 3.7 x 5.8 cm. The echotexture of the uterus is heterogeneous. No discrete fibroids are observed. Endometrium Thickness: 4.1 mm.  There is fluid within the endometrial cavity. Right ovary Measurements: 3.3 x 2.6 x 3.4 cm. There is a right ovarian cyst measuring 2.5 x 1.7 x 2.7 cm. There is a partial septation present. Left ovary Measurements: 2.9 x 2.2 x 2.2 cm. The ovarian echotexture is normal. There are developing follicles. Pulsed Doppler evaluation of both ovaries demonstrates normal low-resistance arterial and venous waveforms. Other findings No abnormal free fluid. IMPRESSION: Partially septated cyst of the right ovary measuring 2.7 x 2.5 x 1.7 cm. Normal appearing left ovary. No free pelvic fluid. Correlation with patient's beta HCG is needed. Ectopic pregnancy is felt less likely than a hemorrhagic cyst or early  tubo-ovarian abscess. Heterogeneous uterine echotexture. Normal thickness of the endometrial stripe but fluid is present in the endometrial cavity. If bleeding remains unresponsive to hormonal or medical therapy, sonohysterogram should be considered for focal lesion work-up. (Ref: Radiological Reasoning: Algorithmic Workup of Abnormal Vaginal Bleeding with Endovaginal Sonography and Sonohysterography. AJR 2008; 409:W11-91) Electronically Signed   By: David  [place] M.D.   On: 05/23/2018 14:56    Procedures Procedures (including critical care time)  Medications Ordered in ED Medications - No data to display   Initial Impression / Assessment and Plan / ED Course  I have reviewed the triage vital signs and the nursing notes.  Pertinent labs & imaging results that were available during my care of the patient were reviewed by me and considered in my medical decision making (see chart for details).     Pt returns to ER for pain control.  Seen here one day prior and had f/u with OB/GYN today for right ovarian cyst.  Continues to have pelvic pain and now bleeding.  Laboratory studies and vitals today are reassuring. Previous workup reviewed.  She is well appearing w/o peritoneal signs.     Controlled Substance Prescriptions [place] Controlled Substance Registry consulted? Yes, I have consulted the [place] Controlled Substances Registry for this patient, and feel the risk/benefit ratio today is favorable for proceeding with this prescription for a controlled substance with understanding that she will f/u with her GYN if pain continues.     Final Clinical Impressions(s) / ED Diagnoses   Final diagnoses:  Cyst of right ovary    ED Discharge Orders    None       Rosey Bath 05/26/18 2154    Raeford Razor, MD 06/01/18 1308

## 2018-05-24 NOTE — Discharge Instructions (Addendum)
Apply warm heat on and off to your lower abdomen.  Follow-up with Dr. Forestine Chute office for recheck.

## 2018-06-02 ENCOUNTER — Encounter: Payer: Self-pay | Admitting: Obstetrics and Gynecology

## 2018-06-21 ENCOUNTER — Other Ambulatory Visit: Payer: Self-pay

## 2018-06-21 ENCOUNTER — Emergency Department (HOSPITAL_COMMUNITY)
Admission: EM | Admit: 2018-06-21 | Discharge: 2018-06-21 | Disposition: A | Discharge status: Home / Self Care | Payer: Medicaid Other | Attending: Emergency Medicine | Admitting: Emergency Medicine

## 2018-06-21 ENCOUNTER — Emergency Department (HOSPITAL_COMMUNITY): Payer: Medicaid Other

## 2018-06-21 ENCOUNTER — Encounter (HOSPITAL_COMMUNITY): Payer: Self-pay | Admitting: Emergency Medicine

## 2018-06-21 DIAGNOSIS — M25512 Pain in left shoulder: Secondary | ICD-10-CM | POA: Insufficient documentation

## 2018-06-21 DIAGNOSIS — F17210 Nicotine dependence, cigarettes, uncomplicated: Secondary | ICD-10-CM | POA: Insufficient documentation

## 2018-06-21 MED ORDER — DEXAMETHASONE 4 MG PO TABS: 4.0000 mg | ORAL_TABLET | Freq: Two times a day (BID) | ORAL | 0 refills | Status: DC

## 2018-06-21 MED ORDER — ACETAMINOPHEN 500 MG PO TABS
1000.0000 mg | ORAL_TABLET | Freq: Once | ORAL | Status: AC
  Administered 2018-06-21: 1000 mg via ORAL
  Filled 2018-06-21: qty 2

## 2018-06-21 MED ORDER — PREDNISONE 20 MG PO TABS
40.0000 mg | ORAL_TABLET | Freq: Once | ORAL | Status: AC
  Administered 2018-06-21: 40 mg via ORAL
  Filled 2018-06-21: qty 2

## 2018-06-21 NOTE — Discharge Instructions (Addendum)
Your vital signs are within normal limits.  Your x-ray is negative for fracture or dislocation.  Your examination suggest possible tendinitis.  There is no effusion of the joint.  Please use your sling until seen by orthopedics.  Use 2 Tylenol every 4 hours.  Please use Decadron 2 times daily with food.  Heating pad to the shoulder may be helpful. Call Dr Romeo Apple or the orthopedic MD of your choice for assistance with your shoulder issue.

## 2018-06-21 NOTE — ED Triage Notes (Addendum)
Patient complaining of left shoulder pain radiating down left arm x 4 days. States pain is worse with movement. Denies known injury.

## 2018-06-21 NOTE — ED Provider Notes (Signed)
[facility] EMERGENCY DEPARTMENT Provider Note   CSN: [id number] Arrival date & time: 06/21/18  1752     History   Chief Complaint Chief Complaint  Patient presents with  . Shoulder Pain    HPI Valerie Robinson is a 32 y.o. female.  Patient is a 32 year old female who presents to the emergency department with a complaint of left shoulder pain.  The patient gives history that this problem is been going on for the past 4 days.  The patient states that she does some repetitive motion with her arms and shoulders, but she does not recall anything that she is done out of the ordinary.  She has not had any recent operations or procedures involving the shoulder.  No recent injury or trauma reported.  The pain is starts in the shoulder and begins to radiate down the left arm.  There is no associated chest pain or shortness of breath.  There is no diaphoresis reported.  No nausea vomiting or loss of consciousness associated with this pain.  The pain can be reproduced with certain movement such as trying to raise her arm overhead or lifting certain amount of objects or weights with the left upper extremity.  The history is provided by the patient.    Past Medical History:  Diagnosis Date  . Anxiety   . Bradycardia    pt says was seen at [facility] for bradycardia but was never put on any medication for it.  . Chronic back pain   . Chronic pelvic pain in female   . Drug-seeking behavior 2015  . Ovarian cyst   . Renal disorder    kidney stone    There are no active problems to display for this patient.   Past Surgical History:  Procedure Laterality Date  . APPENDECTOMY    . CESAREAN SECTION     x2  . CHOLECYSTECTOMY N/A 03/03/2013   Procedure: LAPAROSCOPIC CHOLECYSTECTOMY;  Surgeon: Dalia Heading, MD;  Location: [facility];  Service: General;  Laterality: N/A;  . CHOLECYSTECTOMY  2014     OB History    Gravida  4   Para  3   Term  3   Preterm      AB      Living  3     SAB        TAB      Ectopic      Multiple      Live Births               Home Medications    Prior to Admission medications   Medication Sig Start Date End Date Taking? Authorizing Provider  acetaminophen (TYLENOL) 500 MG tablet Take 1,000 mg by mouth every 6 (six) hours as needed.    [provider]  cyclobenzaprine (FLEXERIL) 10 MG tablet Take 1 tablet (10 mg total) by mouth 2 (two) times daily as needed for muscle spasms. Patient not taking: Reported on 05/24/2018 03/20/18   Margarita Grizzle, MD  dexamethasone (DECADRON) 4 MG tablet Take 1 tablet (4 mg total) by mouth 2 (two) times daily with a meal. Patient not taking: Reported on 05/24/2018 11/02/17   Ivery Quale, PA-C  doxycycline (VIBRAMYCIN) 100 MG capsule Take 1 capsule (100 mg total) by mouth 2 (two) times daily. Patient not taking: Reported on 11/02/2017 06/20/17   Ivery Quale, PA-C  HYDROcodone-acetaminophen (NORCO/VICODIN) 5-325 MG tablet Take one tab po q 4 hrs prn pain 05/24/18   Pauline Aus, PA-C  lidocaine (LIDODERM) 5 % Place 1 patch onto the skin daily. Remove & Discard patch within 12 hours or as directed by MD Patient not taking: Reported on 05/24/2018 03/20/18   Margarita Grizzle, MD    Family History History reviewed. No pertinent family history.  Social History Social History   Tobacco Use  . Smoking status: Current Every Day Smoker    Packs/day: 0.50    Years: 3.00    Pack years: 1.50    Types: Cigarettes  . Smokeless tobacco: Never Used  Substance Use Topics  . Alcohol use: No  . Drug use: No     Allergies   Gabapentin; Naprosyn [naproxen]; and Tramadol   Review of Systems Review of Systems  Constitutional: Negative for activity change.       All ROS Neg except as noted in HPI  HENT: Negative for nosebleeds.   Eyes: Negative for photophobia and discharge.  Respiratory: Negative for cough, shortness of breath and wheezing.   Cardiovascular: Negative for chest pain and palpitations.   Gastrointestinal: Negative for abdominal pain and blood in stool.  Genitourinary: Negative for dysuria, frequency and hematuria.  Musculoskeletal: Positive for arthralgias. Negative for back pain and neck pain.  Skin: Negative.   Neurological: Negative for dizziness, seizures and speech difficulty.  Psychiatric/Behavioral: Negative for confusion and hallucinations.     Physical Exam Updated Vital Signs BP 115/68 (BP Location: Right Arm)   Pulse 85   Temp 98.7 F (37.1 C) (Oral)   Resp 16   Ht 5\' 6"  (1.676 m)   Wt 99.8 kg   LMP 05/24/2018   SpO2 100%   BMI 35.51 kg/m   Physical Exam  Constitutional: She is oriented to person, place, and time. She appears well-developed and well-nourished.  Non-toxic appearance.  HENT:  Head: Normocephalic.  Right Ear: Tympanic membrane and external ear normal.  Left Ear: Tympanic membrane and external ear normal.  Eyes: Pupils are equal, round, and reactive to light. EOM and lids are normal.  Neck: Normal range of motion. Neck supple. Carotid bruit is not present.  Cardiovascular: Normal rate, regular rhythm, normal heart sounds, intact distal pulses and normal pulses.  Pulmonary/Chest: Breath sounds normal. No respiratory distress.  Abdominal: Soft. Bowel sounds are normal. There is no tenderness. There is no guarding.  Musculoskeletal:       Left shoulder: She exhibits decreased range of motion, crepitus and pain. She exhibits no deformity.  No subclavian bruit appreciated.  The radial pulses are 2+.  The capillary refill is less than 2 seconds.  There are no temperature changes of the right or left upper extremity.  Lymphadenopathy:       Head (right side): No submandibular adenopathy present.       Head (left side): No submandibular adenopathy present.    She has no cervical adenopathy.  Neurological: She is alert and oriented to person, place, and time. She has normal strength. No cranial nerve deficit or sensory deficit.  Skin: Skin  is warm and dry.  Psychiatric: She has a normal mood and affect. Her speech is normal.  Nursing note and vitals reviewed.    ED Treatments / Results  Labs (all labs ordered are listed, but only abnormal results are displayed) Labs Reviewed - No data to display  EKG None  Radiology Dg Shoulder Left  Result Date: 06/21/2018 CLINICAL DATA:  Left shoulder pain radiating down left arm for 4 days. No known injury. EXAM: LEFT SHOULDER - 2+ VIEW COMPARISON:  None.  FINDINGS: There is no evidence of fracture or dislocation. There is no evidence of arthropathy or other focal bone abnormality. Soft tissues are unremarkable. IMPRESSION: Negative. Electronically Signed   By: Signa Kell M.D.   On: 06/21/2018 19:09    Procedures Procedures (including critical care time)  Medications Ordered in ED Medications  predniSONE (DELTASONE) tablet 40 mg (has no administration in time range)  acetaminophen (TYLENOL) tablet 1,000 mg (has no administration in time range)     Initial Impression / Assessment and Plan / ED Course  I have reviewed the triage vital signs and the nursing notes.  Pertinent labs & imaging results that were available during my care of the patient were reviewed by me and considered in my medical decision making (see chart for details).       Final Clinical Impressions(s) / ED Diagnoses MDM  Vital signs within normal limits.  Pulse oximetry is 100% on room air.  Within normal limits by my interpretation.  Patient complains of left shoulder pain.  No acute neurovascular deficits appreciated.  X-ray of the shoulder shows no fracture, no dislocation, no other abnormality.  There are no gross neurovascular deficits appreciated.  The examination favors possible tendinitis, and/or rotator cuff injury.  The patient will be treated with a short course of steroids and Tylenol extra strength.  A sling has been provided for the patient.  Patient investigated on the PMP aware  database.  Patient received hydrocodone 10 tablets on May 24, 2018.  Patient referred to orthopedics for additional evaluation concerning the shoulder.   Final diagnoses:  Acute pain of left shoulder    ED Discharge Orders         Ordered    dexamethasone (DECADRON) 4 MG tablet  2 times daily with meals     06/21/18 2116           Ivery Quale, PA-C 06/23/18 1306    Jacalyn Lefevre, MD 06/23/18 [phone]

## 2018-06-21 NOTE — ED Notes (Signed)
ED Provider at bedside. 

## 2018-09-19 ENCOUNTER — Emergency Department (HOSPITAL_COMMUNITY)
Admission: EM | Admit: 2018-09-19 | Discharge: 2018-09-19 | Disposition: A | Discharge status: Home / Self Care | Payer: Medicaid Other | Attending: Emergency Medicine | Admitting: Emergency Medicine

## 2018-09-19 ENCOUNTER — Encounter (HOSPITAL_COMMUNITY): Payer: Self-pay | Admitting: Emergency Medicine

## 2018-09-19 ENCOUNTER — Other Ambulatory Visit: Payer: Self-pay

## 2018-09-19 DIAGNOSIS — J029 Acute pharyngitis, unspecified: Secondary | ICD-10-CM | POA: Diagnosis present

## 2018-09-19 DIAGNOSIS — F17210 Nicotine dependence, cigarettes, uncomplicated: Secondary | ICD-10-CM | POA: Insufficient documentation

## 2018-09-19 DIAGNOSIS — H6690 Otitis media, unspecified, unspecified ear: Secondary | ICD-10-CM | POA: Diagnosis not present

## 2018-09-19 MED ORDER — AMOXICILLIN 500 MG PO CAPS: 500.0000 mg | ORAL_CAPSULE | Freq: Three times a day (TID) | ORAL | 0 refills | Status: DC

## 2018-09-19 MED ORDER — MAGIC MOUTHWASH W/LIDOCAINE: 5.0000 mL | Freq: Three times a day (TID) | ORAL | 0 refills | Status: DC | PRN

## 2018-09-19 NOTE — Discharge Instructions (Addendum)
Take the antibiotic as directed until its finished.  You may also take ibuprofen or Tylenol if needed for fever or pain.  Follow-up with your primary doctor for recheck if needed.

## 2018-09-19 NOTE — ED Triage Notes (Signed)
Patient reports sore throat that started last night with R otalgia.

## 2018-09-20 NOTE — ED Provider Notes (Signed)
[facility] EMERGENCY DEPARTMENT Provider Note   CSN: [id number] Arrival date & time: 09/19/18  1145     History   Chief Complaint Chief Complaint  Patient presents with  . Sore Throat    HPI Valerie Robinson is a 33 y.o. female.  HPI   Valerie Robinson is a 33 y.o. female who presents to the Emergency Department complaining of sore throat, nasal congestion, and right ear pain.  Symptoms began on the evening prior to arrival.  Her symptoms also been associated with an occasional cough.  She has been taking over-the-counter cold and cough medications without relief.  She describes a sharp throbbing pain to her right ear that is constant.  She denies chest pain, shortness of breath, fever or chills.  No known sick contacts.   Past Medical History:  Diagnosis Date  . Anxiety   . Bradycardia    pt says was seen at [facility] for bradycardia but was never put on any medication for it.  . Chronic back pain   . Chronic pelvic pain in female   . Drug-seeking behavior 2015  . Ovarian cyst   . Renal disorder    kidney stone    There are no active problems to display for this patient.   Past Surgical History:  Procedure Laterality Date  . APPENDECTOMY    . CESAREAN SECTION     x2  . CHOLECYSTECTOMY N/A 03/03/2013   Procedure: LAPAROSCOPIC CHOLECYSTECTOMY;  Surgeon: Dalia HeadingMark A Jenkins, MD;  Location: [facility];  Service: General;  Laterality: N/A;  . CHOLECYSTECTOMY  2014     OB History    Gravida  4   Para  3   Term  3   Preterm      AB      Living  3     SAB      TAB      Ectopic      Multiple      Live Births               Home Medications    Prior to Admission medications   Medication Sig Start Date End Date Taking? Authorizing Provider  acetaminophen (TYLENOL) 500 MG tablet Take 1,000 mg by mouth every 6 (six) hours as needed.    [provider]  amoxicillin (AMOXIL) 500 MG capsule Take 1 capsule (500 mg total) by mouth 3 (three) times daily.  09/19/18   Roan Miklos, PA-C  cyclobenzaprine (FLEXERIL) 10 MG tablet Take 1 tablet (10 mg total) by mouth 2 (two) times daily as needed for muscle spasms. Patient not taking: Reported on 05/24/2018 03/20/18   Margarita Grizzleay, Danielle, MD  dexamethasone (DECADRON) 4 MG tablet Take 1 tablet (4 mg total) by mouth 2 (two) times daily with a meal. 06/21/18   Ivery QualeBryant, Hobson, PA-C  HYDROcodone-acetaminophen (NORCO/VICODIN) 5-325 MG tablet Take one tab po q 4 hrs prn pain 05/24/18   Kazuma Elena, PA-C  lidocaine (LIDODERM) 5 % Place 1 patch onto the skin daily. Remove & Discard patch within 12 hours or as directed by MD Patient not taking: Reported on 05/24/2018 03/20/18   Margarita Grizzleay, Danielle, MD  magic mouthwash w/lidocaine SOLN Take 5 mLs by mouth 3 (three) times daily as needed for mouth pain. Swish and spit, do not swallow 09/19/18   Pauline Ausriplett, Armentha Branagan, PA-C    Family History History reviewed. No pertinent family history.  Social History Social History   Tobacco Use  . Smoking status: Current Every Day  Smoker    Packs/day: 0.50    Years: 3.00    Pack years: 1.50    Types: Cigarettes  . Smokeless tobacco: Never Used  Substance Use Topics  . Alcohol use: No  . Drug use: No     Allergies   Gabapentin; Naprosyn [naproxen]; and Tramadol   Review of Systems Review of Systems  Constitutional: Negative for activity change, appetite change, chills and fever.  HENT: Positive for congestion, ear pain and sore throat. Negative for facial swelling, hearing loss, rhinorrhea and trouble swallowing.   Eyes: Negative for visual disturbance.  Respiratory: Positive for cough. Negative for chest tightness, shortness of breath, wheezing and stridor.   Cardiovascular: Negative for chest pain.  Gastrointestinal: Negative for diarrhea, nausea and vomiting.  Genitourinary: Negative for dysuria.  Musculoskeletal: Negative for myalgias, neck pain and neck stiffness.  Skin: Negative for rash.  Neurological: Negative for  dizziness and headaches.  Hematological: Negative for adenopathy.     Physical Exam Updated Vital Signs BP (!) 126/59 (BP Location: Right Arm)   Pulse 67   Temp 98 F (36.7 C) (Oral)   Resp 12   Ht 5\' 6"  (1.676 m)   Wt 99.8 kg   LMP 09/06/2018   SpO2 99%   BMI 35.51 kg/m   Physical Exam Vitals signs and nursing note reviewed.  Constitutional:      General: She is not in acute distress.    Appearance: Normal appearance. She is not ill-appearing or toxic-appearing.  HENT:     Head: Atraumatic.     Right Ear: Ear canal normal.     Left Ear: Tympanic membrane and ear canal normal.     Ears:     Comments: Erythema of the right TM with loss of visual landmarks.  No bulging or perforation.  No drainage of the canal.    Mouth/Throat:     Mouth: Mucous membranes are moist.     Pharynx: Oropharynx is clear. Posterior oropharyngeal erythema present. No oropharyngeal exudate.     Comments: Mild erythema of the oropharynx without edema or exudates.  Uvula is midline and nonedematous. Neck:     Musculoskeletal: Normal range of motion.  Cardiovascular:     Rate and Rhythm: Normal rate and regular rhythm.  Pulmonary:     Effort: Pulmonary effort is normal. No respiratory distress.     Breath sounds: Normal breath sounds. No wheezing or rales.  Musculoskeletal: Normal range of motion.  Lymphadenopathy:     Cervical: No cervical adenopathy.  Skin:    General: Skin is warm.     Capillary Refill: Capillary refill takes less than 2 seconds.     Findings: No rash.  Neurological:     General: No focal deficit present.     Mental Status: She is alert.     Sensory: No sensory deficit.     Motor: No weakness.      ED Treatments / Results  Labs (all labs ordered are listed, but only abnormal results are displayed) Labs Reviewed - No data to display  EKG None  Radiology No results found.  Procedures Procedures (including critical care time)  Medications Ordered in  ED Medications - No data to display   Initial Impression / Assessment and Plan / ED Course  I have reviewed the triage vital signs and the nursing notes.  Pertinent labs & imaging results that were available during my care of the patient were reviewed by me and considered in my medical decision making (  see chart for details).     Patient well-appearing.  Nontoxic.  Airway is patent without concerning symptoms for peritonsillar or retropharyngeal abscess.  Patient does have right otitis media, so I will treat with amoxicillin and Magic mouthwash.  Patient appears appropriate for discharge home and agrees to plan.  Final Clinical Impressions(s) / ED Diagnoses   Final diagnoses:  Acute otitis media, unspecified otitis media type  Sore throat    ED Discharge Orders         Ordered    amoxicillin (AMOXIL) 500 MG capsule  3 times daily     09/19/18 1254    magic mouthwash w/lidocaine SOLN  3 times daily PRN     09/19/18 1254           Pauline Aus, PA-C 09/20/18 1610    LongArlyss Repress, MD 09/20/18 1113

## 2018-11-23 ENCOUNTER — Other Ambulatory Visit: Payer: Self-pay

## 2018-11-23 ENCOUNTER — Emergency Department (HOSPITAL_COMMUNITY)
Admission: EM | Admit: 2018-11-23 | Discharge: 2018-11-23 | Disposition: A | Discharge status: Home / Self Care | Payer: Medicaid Other | Attending: Emergency Medicine | Admitting: Emergency Medicine

## 2018-11-23 ENCOUNTER — Emergency Department (HOSPITAL_COMMUNITY): Payer: Medicaid Other

## 2018-11-23 ENCOUNTER — Encounter (HOSPITAL_COMMUNITY): Payer: Self-pay

## 2018-11-23 DIAGNOSIS — F17210 Nicotine dependence, cigarettes, uncomplicated: Secondary | ICD-10-CM | POA: Diagnosis not present

## 2018-11-23 DIAGNOSIS — K0889 Other specified disorders of teeth and supporting structures: Secondary | ICD-10-CM | POA: Diagnosis present

## 2018-11-23 DIAGNOSIS — Z79899 Other long term (current) drug therapy: Secondary | ICD-10-CM | POA: Insufficient documentation

## 2018-11-23 MED ORDER — LIDOCAINE-EPINEPHRINE 1 %-1:100000 IJ SOLN
20.0000 mL | Freq: Once | INTRAMUSCULAR | Status: AC
  Administered 2018-11-23: 20 mL via INTRADERMAL
  Filled 2018-11-23: qty 20

## 2018-11-23 MED ORDER — HYDROCODONE-ACETAMINOPHEN 5-325 MG PO TABS: ORAL_TABLET | ORAL | 0 refills | Status: DC

## 2018-11-23 NOTE — ED Notes (Signed)
Patient verbalizes understanding of discharge instructions. Opportunity for questioning and answers were provided. Armband removed by staff, pt discharged from ED. Pt ambulatory to lobby. Prescriptions and pharmacy reviewed.

## 2018-11-23 NOTE — Procedures (Signed)
2% lido 1:100,000 epi. X 6cc  IAN nerve block Left Forceps extraction # 14, 18. Tolerated procedure well.

## 2018-11-23 NOTE — Consult Note (Signed)
Reason for Consult:dental pain Referring Physician: ER  Valerie Robinson is an 33 y.o. female.  BP:JPET upper and lower molar pain for 2-3 weeks. Getting worse daily   HPI: Denies feever, swelling,   Past Medical History:  Diagnosis Date  . Anxiety   . Bradycardia    pt says was seen at [facility] for bradycardia but was never put on any medication for it.  . Chronic back pain   . Chronic pelvic pain in female   . Drug-seeking behavior 2015  . Ovarian cyst   . Renal disorder    kidney stone    Past Surgical History:  Procedure Laterality Date  . APPENDECTOMY    . CESAREAN SECTION     x2  . CHOLECYSTECTOMY N/A 03/03/2013   Procedure: LAPAROSCOPIC CHOLECYSTECTOMY;  Surgeon: Dalia Heading, MD;  Location: [facility];  Service: General;  Laterality: N/A;  . CHOLECYSTECTOMY  2014    History reviewed. No pertinent family history.  Social History:  reports that she has been smoking cigarettes. She has a 1.50 pack-year smoking history. She has never used smokeless tobacco. She reports that she does not drink alcohol or use drugs.  Allergies:  Allergies  Allergen Reactions  . Gabapentin Rash  . Naprosyn [Naproxen] Hives, Nausea And Vomiting and Rash  . Tramadol Rash    Medications: I have reviewed the patient's current medications.  No results found for this or any previous visit (from the past 48 hour(s)).  Dg Orthopantogram  Result Date: 11/23/2018 CLINICAL DATA:  33 year old female with left upper jaw/tooth pain EXAM: ORTHOPANTOGRAM/PANORAMIC COMPARISON:  None. FINDINGS: Linear metallic defect projects over the left nasal passages, likely representing a nose ring. Multiple missing mandibular teeth. Carry noted in the left maxillary first premolar. Additionally, there is central lucency within the left maxillary first molar. No definite bony erosion or abscess. The sinuses are clear. IMPRESSION: Carious left maxillary first premolar and left maxillary first molar. Multiple missing  mandibular teeth. Electronically Signed   By: Malachy Moan M.D.   On: 11/23/2018 14:46    ROS Blood pressure 124/81, pulse 76, temperature 97.6 F (36.4 C), temperature source Oral, resp. rate 18, SpO2 100 %, unknown if currently breastfeeding. General appearance: alert, cooperative and no distress Head: Normocephalic, without obvious abnormality, atraumatic Eyes: negative Nose: Nares normal. Septum midline. Mucosa normal. No drainage or sinus tenderness. Throat: lips, mucosa, and tongue normal; teeth and gums normal and carious lesion # 14,18, Percussion tender. No purulence, fluctuance, trismus Neck: no adenopathy, supple, symmetrical, trachea midline and thyroid not enlarged, symmetric, no tenderness/mass/nodules  Assessment/Plan: Abscessed teeth # 14, 18; Extraction with local.  Ocie Doyne 11/23/2018, 3:20 PM

## 2018-11-23 NOTE — ED Provider Notes (Signed)
[facility] EMERGENCY DEPARTMENT Provider Note   CSN: [id number] Arrival date & time: 11/23/18  1320    History   Chief Complaint Chief Complaint  Patient presents with  . Dental Pain    HPI Valerie Robinson is a 33 y.o. female.  She is complaining of left upper molar pain for about 2 weeks.  This tooth is actually given her troubles in the past.  She rates the pain is 8 out of 10.  It somewhat resolved with ibuprofen.  She said Dr. Barbette Merino was supposed to possibly do an extraction on this tomorrow but his office is closed and he referred her here for evaluation.     The history is provided by the patient.  Dental Pain  Location:  Upper Upper teeth location:  14/LU 1st molar Quality:  Aching and constant Severity:  Severe Onset quality:  Gradual Timing:  Constant Progression:  Worsening Chronicity:  Recurrent Context: dental fracture   Context: not abscess   Relieved by:  Nothing Worsened by:  Cold food/drink and hot food/drink Ineffective treatments:  NSAIDs Associated symptoms: facial pain   Associated symptoms: no difficulty swallowing, no drooling, no facial swelling, no fever, no headaches, no neck pain, no neck swelling, no oral bleeding and no trismus   Risk factors: smoking   Risk factors: no immunosuppression     Past Medical History:  Diagnosis Date  . Anxiety   . Bradycardia    pt says was seen at [facility] for bradycardia but was never put on any medication for it.  . Chronic back pain   . Chronic pelvic pain in female   . Drug-seeking behavior 2015  . Ovarian cyst   . Renal disorder    kidney stone    There are no active problems to display for this patient.   Past Surgical History:  Procedure Laterality Date  . APPENDECTOMY    . CESAREAN SECTION     x2  . CHOLECYSTECTOMY N/A 03/03/2013   Procedure: LAPAROSCOPIC CHOLECYSTECTOMY;  Surgeon: Dalia Heading, MD;  Location: [facility];  Service: General;  Laterality: N/A;  . CHOLECYSTECTOMY   2014     OB History    Gravida  4   Para  3   Term  3   Preterm      AB      Living  3     SAB      TAB      Ectopic      Multiple      Live Births               Home Medications    Prior to Admission medications   Medication Sig Start Date End Date Taking? Authorizing Provider  acetaminophen (TYLENOL) 500 MG tablet Take 1,000 mg by mouth every 6 (six) hours as needed.    [provider]  amoxicillin (AMOXIL) 500 MG capsule Take 1 capsule (500 mg total) by mouth 3 (three) times daily. 09/19/18   Triplett, Tammy, PA-C  cyclobenzaprine (FLEXERIL) 10 MG tablet Take 1 tablet (10 mg total) by mouth 2 (two) times daily as needed for muscle spasms. Patient not taking: Reported on 05/24/2018 03/20/18   Margarita Grizzle, MD  dexamethasone (DECADRON) 4 MG tablet Take 1 tablet (4 mg total) by mouth 2 (two) times daily with a meal. 06/21/18   Ivery Quale, PA-C  HYDROcodone-acetaminophen (NORCO/VICODIN) 5-325 MG tablet Take one tab po q 4 hrs prn pain 05/24/18   Triplett,  Tammy, PA-C  lidocaine (LIDODERM) 5 % Place 1 patch onto the skin daily. Remove & Discard patch within 12 hours or as directed by MD Patient not taking: Reported on 05/24/2018 03/20/18   Margarita Grizzle, MD  magic mouthwash w/lidocaine SOLN Take 5 mLs by mouth 3 (three) times daily as needed for mouth pain. Swish and spit, do not swallow 09/19/18   Pauline Aus, PA-C    Family History History reviewed. No pertinent family history.  Social History Social History   Tobacco Use  . Smoking status: Current Every Day Smoker    Packs/day: 0.50    Years: 3.00    Pack years: 1.50    Types: Cigarettes  . Smokeless tobacco: Never Used  Substance Use Topics  . Alcohol use: No  . Drug use: No     Allergies   Gabapentin; Naprosyn [naproxen]; and Tramadol   Review of Systems Review of Systems  Constitutional: Negative for fever.  HENT: Positive for dental problem. Negative for drooling, facial  swelling and sore throat.   Eyes: Negative for visual disturbance.  Respiratory: Negative for shortness of breath.   Cardiovascular: Negative for chest pain.  Gastrointestinal: Negative for abdominal pain.  Genitourinary: Negative for dysuria.  Musculoskeletal: Negative for neck pain.  Skin: Negative for rash.  Neurological: Negative for headaches.     Physical Exam Updated Vital Signs BP 124/81   Pulse 76   Temp 97.6 F (36.4 C) (Oral)   Resp 18   SpO2 100%   Physical Exam Constitutional:      Appearance: She is well-developed.  HENT:     Head: Normocephalic and atraumatic.     Mouth/Throat:     Mouth: Mucous membranes are moist.     Dentition: Abnormal dentition. Dental tenderness and dental caries present. No dental abscesses.     Pharynx: Oropharynx is clear. No posterior oropharyngeal erythema.  Eyes:     Conjunctiva/sclera: Conjunctivae normal.  Neck:     Musculoskeletal: Neck supple.  Cardiovascular:     Rate and Rhythm: Normal rate and regular rhythm.  Pulmonary:     Effort: Pulmonary effort is normal.     Breath sounds: Normal breath sounds.  Skin:    General: Skin is warm and dry.     Capillary Refill: Capillary refill takes less than 2 seconds.  Neurological:     General: No focal deficit present.     Mental Status: She is alert.     GCS: GCS eye subscore is 4. GCS verbal subscore is 5. GCS motor subscore is 6.      ED Treatments / Results  Labs (all labs ordered are listed, but only abnormal results are displayed) Labs Reviewed - No data to display  EKG None  Radiology No results found.  Procedures Procedures (including critical care time)  Medications Ordered in ED Medications - No data to display   Initial Impression / Assessment and Plan / ED Course  I have reviewed the triage vital signs and the nursing notes.  Pertinent labs & imaging results that were available during my care of the patient were reviewed by me and considered in  my medical decision making (see chart for details).  Clinical Course as of Nov 24 838  Wed Nov 23, 2018  2449 33 year old female here with recurrent dental pain left upper molar.  She is missing part of the tooth unclear if it was an old filling that fell out or she is fractured it.  Dr. Barbette Merino from  oral surgery is aware of the patient and will evaluate her in the emergency department.  Needs dental caries, dental abscess, osteomyelitis.   [MB]    Clinical Course User Index [MB] Terrilee Files, MD      Patient seen and treated by oral surgeon Dr Barbette Merino.   Final Clinical Impressions(s) / ED Diagnoses   Final diagnoses:  Dentalgia    ED Discharge Orders         Ordered    HYDROcodone-acetaminophen (NORCO/VICODIN) 5-325 MG tablet     11/23/18 1536           Terrilee Files, MD 11/24/18 [phone]

## 2018-11-23 NOTE — Discharge Instructions (Signed)
You were seen in the emergency department for evaluation of your dental pain.  You were seen and treated by the oral surgeon Dr. Laural Benes.  He gave you instructions for care after his procedure.  We are prescribing you with a short course of some narcotic pain medicine.

## 2018-11-23 NOTE — ED Triage Notes (Signed)
Pt endorses was sent here by Barbette Merino MD for top left dental pain x2 weeks. Pt reports pain 8/10. Pt took 800 mg of ibuprofen around 1030.

## 2019-05-15 ENCOUNTER — Emergency Department (HOSPITAL_COMMUNITY)
Admission: EM | Admit: 2019-05-15 | Discharge: 2019-05-15 | Disposition: A | Discharge status: Home / Self Care | Payer: Medicaid Other | Attending: Emergency Medicine | Admitting: Emergency Medicine

## 2019-05-15 ENCOUNTER — Other Ambulatory Visit: Payer: Self-pay

## 2019-05-15 ENCOUNTER — Encounter (HOSPITAL_COMMUNITY): Payer: Self-pay

## 2019-05-15 DIAGNOSIS — R1032 Left lower quadrant pain: Secondary | ICD-10-CM | POA: Insufficient documentation

## 2019-05-15 DIAGNOSIS — F17210 Nicotine dependence, cigarettes, uncomplicated: Secondary | ICD-10-CM | POA: Diagnosis not present

## 2019-05-15 LAB — URINALYSIS, ROUTINE W REFLEX MICROSCOPIC
Bilirubin Urine: NEGATIVE
Glucose, UA: NEGATIVE mg/dL
Hgb urine dipstick: NEGATIVE
Ketones, ur: NEGATIVE mg/dL
Leukocytes,Ua: NEGATIVE
Nitrite: NEGATIVE
Protein, ur: NEGATIVE mg/dL
Specific Gravity, Urine: 1.019 (ref 1.005–1.030)
pH: 5 (ref 5.0–8.0)

## 2019-05-15 LAB — COMPREHENSIVE METABOLIC PANEL
ALT: 19 U/L (ref 0–44)
AST: 14 U/L — ABNORMAL LOW (ref 15–41)
Albumin: 4.1 g/dL (ref 3.5–5.0)
Alkaline Phosphatase: 54 U/L (ref 38–126)
Anion gap: 13 (ref 5–15)
BUN: 8 mg/dL (ref 6–20)
CO2: 17 mmol/L — ABNORMAL LOW (ref 22–32)
Calcium: 9.3 mg/dL (ref 8.9–10.3)
Chloride: 108 mmol/L (ref 98–111)
Creatinine, Ser: 0.65 mg/dL (ref 0.44–1.00)
GFR calc Af Amer: >60 mL/min (ref >60)
GFR calc non Af Amer: >60 mL/min (ref >60)
Glucose, Bld: 98 mg/dL (ref 70–99)
Potassium: 3.8 mmol/L (ref 3.5–5.1)
Sodium: 138 mmol/L (ref 135–145)
Total Bilirubin: 0.4 mg/dL (ref 0.3–1.2)
Total Protein: 7.3 g/dL (ref 6.5–8.1)

## 2019-05-15 LAB — CBC WITH DIFFERENTIAL/PLATELET
Abs Immature Granulocytes: 0.03 10*3/uL (ref 0.00–0.07)
Basophils Absolute: 0 10*3/uL (ref 0.0–0.1)
Basophils Relative: 0 %
Eosinophils Absolute: 0.1 10*3/uL (ref 0.0–0.5)
Eosinophils Relative: 1 %
HCT: 42.7 % (ref 36.0–46.0)
Hemoglobin: 14.3 g/dL (ref 12.0–15.0)
Immature Granulocytes: 0 %
Lymphocytes Relative: 18 %
Lymphs Abs: 2 10*3/uL (ref 0.7–4.0)
MCH: 32.8 pg (ref 26.0–34.0)
MCHC: 33.5 g/dL (ref 30.0–36.0)
MCV: 97.9 fL (ref 80.0–100.0)
Monocytes Absolute: 0.6 10*3/uL (ref 0.1–1.0)
Monocytes Relative: 5 %
Neutro Abs: 8.4 10*3/uL — ABNORMAL HIGH (ref 1.7–7.7)
Neutrophils Relative %: 76 %
Platelets: 259 10*3/uL (ref 150–400)
RBC: 4.36 MIL/uL (ref 3.87–5.11)
RDW: 11.9 % (ref 11.5–15.5)
WBC: 11.1 10*3/uL — ABNORMAL HIGH (ref 4.0–10.5)
nRBC: 0 % (ref 0.0–0.2)

## 2019-05-15 LAB — PREGNANCY, URINE: Preg Test, Ur: NEGATIVE

## 2019-05-15 LAB — LIPASE, BLOOD: Lipase: 30 U/L (ref 11–51)

## 2019-05-15 MED ORDER — SODIUM CHLORIDE 0.9 % IV BOLUS (SEPSIS)
1000.0000 mL | Freq: Once | INTRAVENOUS | Status: AC
  Administered 2019-05-15: 1000 mL via INTRAVENOUS

## 2019-05-15 MED ORDER — ONDANSETRON HCL 4 MG PO TABS: 4.0000 mg | ORAL_TABLET | Freq: Four times a day (QID) | ORAL | 0 refills | Status: DC

## 2019-05-15 MED ORDER — ONDANSETRON HCL 4 MG/2ML IJ SOLN
4.0000 mg | Freq: Once | INTRAMUSCULAR | Status: AC
  Administered 2019-05-15: 14:00:00 4 mg via INTRAVENOUS
  Filled 2019-05-15: qty 2

## 2019-05-15 MED ORDER — KETOROLAC TROMETHAMINE 30 MG/ML IJ SOLN
30.0000 mg | Freq: Once | INTRAMUSCULAR | Status: AC
  Administered 2019-05-15: 30 mg via INTRAVENOUS
  Filled 2019-05-15: qty 1

## 2019-05-15 NOTE — Discharge Instructions (Signed)
Follow-up with your OB/GYN.  If you have any new or worsening symptoms please return to the emergency department.  Drink plenty of fluids.

## 2019-05-15 NOTE — ED Notes (Signed)
Pt actively dry heaving in room. PA notified and medicated as ordered.

## 2019-05-15 NOTE — ED Triage Notes (Signed)
Pt having pain in ovaries. History of ovarian cysts. Pain started late Friday night. Pt usually sees OBGYN at [facility]. Denies vaginal bleeding.

## 2019-05-15 NOTE — ED Provider Notes (Signed)
[facility] EMERGENCY DEPARTMENT Provider Note   CSN: [id number] Arrival date & time: 05/15/19  1039     History   Chief Complaint Chief Complaint  Patient presents with  . Abdominal Pain    HPI Valerie Robinson is a 33 y.o. female.     Patient is a 33 year old female with past medical history of anxiety, bradycardia, chronic back pain, chronic pelvic pain, multiple ovarian cysts, drug-seeking behavior presents emergency department for left lower quadrant pain.  Patient reports that the pain started on Friday.  Reports that she has been taking over-the-counter Advil and Tylenol for the pain.  Reports that it somewhat feels like past instances where she had ovarian cyst.  Reports having procedures for ovarian cyst in the past but having recurrence.  Denies any vaginal bleeding, vaginal discharge, diarrhea, dysuria, hematuria, fever.  Reports that the pain is constant but sometimes gets better when she stands up and walks.  Reports that she had one episode of nonbloody vomiting.     Past Medical History:  Diagnosis Date  . Anxiety   . Bradycardia    pt says was seen at [facility] for bradycardia but was never put on any medication for it.  . Chronic back pain   . Chronic pelvic pain in female   . Drug-seeking behavior 2015  . Ovarian cyst   . Renal disorder    kidney stone    There are no active problems to display for this patient.   Past Surgical History:  Procedure Laterality Date  . APPENDECTOMY    . CESAREAN SECTION     x2  . CHOLECYSTECTOMY N/A 03/03/2013   Procedure: LAPAROSCOPIC CHOLECYSTECTOMY;  Surgeon: Dalia Heading, MD;  Location: [facility];  Service: General;  Laterality: N/A;  . CHOLECYSTECTOMY  2014     OB History    Gravida  4   Para  3   Term  3   Preterm      AB      Living  3     SAB      TAB      Ectopic      Multiple      Live Births               Home Medications    Prior to Admission medications   Medication Sig Start Date  End Date Taking? Authorizing Provider  acetaminophen (TYLENOL) 500 MG tablet Take 1,000 mg by mouth every 4 (four) hours as needed for moderate pain.    Yes [provider]  amoxicillin (AMOXIL) 500 MG capsule Take 1 capsule (500 mg total) by mouth 3 (three) times daily. Patient not taking: Reported on 05/15/2019 09/19/18   Triplett, Tammy, PA-C  cyclobenzaprine (FLEXERIL) 10 MG tablet Take 1 tablet (10 mg total) by mouth 2 (two) times daily as needed for muscle spasms. Patient not taking: Reported on 05/24/2018 03/20/18   Margarita Grizzle, MD  dexamethasone (DECADRON) 4 MG tablet Take 1 tablet (4 mg total) by mouth 2 (two) times daily with a meal. Patient not taking: Reported on 05/15/2019 06/21/18   Ivery Quale, PA-C  HYDROcodone-acetaminophen (NORCO/VICODIN) 5-325 MG tablet Take one tab po q 4 hrs prn pain Patient not taking: Reported on 05/15/2019 11/23/18   Terrilee Files, MD  lidocaine (LIDODERM) 5 % Place 1 patch onto the skin daily. Remove & Discard patch within 12 hours or as directed by MD Patient not taking: Reported on 05/24/2018 03/20/18   Margarita Grizzle,  MD  magic mouthwash w/lidocaine SOLN Take 5 mLs by mouth 3 (three) times daily as needed for mouth pain. Swish and spit, do not swallow Patient not taking: Reported on 05/15/2019 09/19/18   Triplett, Tammy, PA-C  ondansetron (ZOFRAN) 4 MG tablet Take 1 tablet (4 mg total) by mouth every 6 (six) hours. 05/15/19   Alveria Apley, PA-C    Family History No family history on file.  Social History Social History   Tobacco Use  . Smoking status: Current Every Day Smoker    Packs/day: 0.50    Years: 3.00    Pack years: 1.50    Types: Cigarettes  . Smokeless tobacco: Never Used  Substance Use Topics  . Alcohol use: No  . Drug use: No     Allergies   Gabapentin, Naprosyn [naproxen], and Tramadol   Review of Systems Review of Systems  Constitutional: Negative for chills and fever.  HENT: Negative for ear pain and sore  throat.   Eyes: Negative for pain and visual disturbance.  Respiratory: Negative for cough and shortness of breath.   Cardiovascular: Negative for chest pain and palpitations.  Gastrointestinal: Positive for abdominal pain, nausea and vomiting. Negative for abdominal distention and constipation.  Genitourinary: Negative for decreased urine volume, difficulty urinating, dysuria, flank pain, frequency, hematuria, vaginal bleeding, vaginal discharge and vaginal pain.  Musculoskeletal: Negative for arthralgias and back pain.  Skin: Negative for color change and rash.  Neurological: Negative for dizziness and syncope.  All other systems reviewed and are negative.    Physical Exam Updated Vital Signs BP 97/81 (BP Location: Right Arm)   Pulse 66   Temp 98.6 F (37 C)   Resp 15   Ht 5\' 6"  (1.676 m)   Wt 104.3 kg   LMP 04/29/2019   SpO2 99%   BMI 37.12 kg/m   Physical Exam Vitals signs and nursing note reviewed.  Constitutional:      Appearance: Normal appearance.  HENT:     Head: Normocephalic.  Eyes:     Conjunctiva/sclera: Conjunctivae normal.  Cardiovascular:     Rate and Rhythm: Normal rate and regular rhythm.  Pulmonary:     Effort: Pulmonary effort is normal.     Breath sounds: Normal breath sounds.  Abdominal:     General: Abdomen is flat. Bowel sounds are normal.     Palpations: Abdomen is soft.     Tenderness: There is abdominal tenderness in the left lower quadrant. There is no right CVA tenderness, left CVA tenderness, guarding or rebound. Negative signs include Murphy's sign, Rovsing's sign and McBurney's sign.  Skin:    General: Skin is warm and dry.  Neurological:     Mental Status: She is alert.  Psychiatric:        Mood and Affect: Mood normal.      ED Treatments / Results  Labs (all labs ordered are listed, but only abnormal results are displayed) Labs Reviewed  CBC WITH DIFFERENTIAL/PLATELET - Abnormal; Notable for the following components:       Result Value   WBC 11.1 (*)    Neutro Abs 8.4 (*)    All other components within normal limits  COMPREHENSIVE METABOLIC PANEL - Abnormal; Notable for the following components:   CO2 17 (*)    AST 14 (*)    All other components within normal limits  URINALYSIS, ROUTINE W REFLEX MICROSCOPIC - Abnormal; Notable for the following components:   APPearance HAZY (*)    All other components  within normal limits  LIPASE, BLOOD  PREGNANCY, URINE    EKG None  Radiology No results found.  Procedures Procedures (including critical care time)  Medications Ordered in ED Medications  sodium chloride 0.9 % bolus 1,000 mL (1,000 mLs Intravenous New Bag/Given 05/15/19 1237)  ketorolac (TORADOL) 30 MG/ML injection 30 mg (30 mg Intravenous Given 05/15/19 1237)  ondansetron (ZOFRAN) injection 4 mg (4 mg Intravenous Given 05/15/19 1408)     Initial Impression / Assessment and Plan / ED Course  I have reviewed the triage vital signs and the nursing notes.  Pertinent labs & imaging results that were available during my care of the patient were reviewed by me and considered in my medical decision making (see chart for details).  Clinical Course as of May 14 1452  Mon May 15, 2019  [id number] 33 year old female with chronic pelvic pain presenting to the emergency department for similar complaints since last Friday.  Pain improved with Toradol.  Urinalysis unremarkable.   [KM]  1350 Labs and vitals reassuring. Patient improved with toradol.   [KM]    Clinical Course User Index [KM] Arlyn Dunning, PA-C       Based on review of vitals, medical screening exam, lab work and/or imaging, there does not appear to be an acute, emergent etiology for the patient's symptoms. Counseled pt on good return precautions and encouraged both PCP and ED follow-up as needed.  Prior to discharge, I also discussed incidental imaging findings with patient in detail and advised appropriate, recommended follow-up in detail.   Clinical Impression: 1. Left lower quadrant abdominal pain     Disposition: Discharge  Prior to providing a prescription for a controlled substance, I independently reviewed the patient's recent prescription history on the [place] Controlled Substance Reporting System. The patient had no recent or regular prescriptions and was deemed appropriate for a brief, less than 3 day prescription of narcotic for acute analgesia.  This note was prepared with assistance of Conservation officer, historic buildings. Occasional wrong-word or sound-a-like substitutions may have occurred due to the inherent limitations of voice recognition software.   Final Clinical Impressions(s) / ED Diagnoses   Final diagnoses:  Left lower quadrant abdominal pain    ED Discharge Orders         Ordered    ondansetron (ZOFRAN) 4 MG tablet  Every 6 hours     05/15/19 1452           Jeral Pinch 05/15/19 1453    Raeford Razor, MD 05/16/19 [phone]

## 2019-09-12 ENCOUNTER — Ambulatory Visit: Payer: Medicaid Other | Attending: Internal Medicine

## 2019-09-12 ENCOUNTER — Other Ambulatory Visit: Payer: Self-pay

## 2019-09-12 DIAGNOSIS — Z20822 Contact with and (suspected) exposure to covid-19: Secondary | ICD-10-CM

## 2019-09-13 LAB — NOVEL CORONAVIRUS, NAA: SARS-CoV-2, NAA: NOT DETECTED

## 2020-10-28 ENCOUNTER — Encounter: Payer: Self-pay | Admitting: Emergency Medicine

## 2020-10-28 ENCOUNTER — Ambulatory Visit
Admission: EM | Admit: 2020-10-28 | Discharge: 2020-10-28 | Disposition: A | Discharge status: Home / Self Care | Payer: Medicaid Other | Attending: Emergency Medicine | Admitting: Emergency Medicine

## 2020-10-28 ENCOUNTER — Other Ambulatory Visit: Payer: Self-pay

## 2020-10-28 DIAGNOSIS — N390 Urinary tract infection, site not specified: Secondary | ICD-10-CM | POA: Diagnosis present

## 2020-10-28 DIAGNOSIS — M545 Low back pain, unspecified: Secondary | ICD-10-CM | POA: Insufficient documentation

## 2020-10-28 DIAGNOSIS — R1030 Lower abdominal pain, unspecified: Secondary | ICD-10-CM | POA: Insufficient documentation

## 2020-10-28 LAB — POCT URINALYSIS DIP (MANUAL ENTRY)
Bilirubin, UA: NEGATIVE
Glucose, UA: NEGATIVE mg/dL
Ketones, POC UA: NEGATIVE mg/dL
Nitrite, UA: NEGATIVE
Protein Ur, POC: NEGATIVE mg/dL
Spec Grav, UA: >=1.03 — AB (ref 1.010–1.025)
Urobilinogen, UA: 0.2 E.U./dL
pH, UA: 6 (ref 5.0–8.0)

## 2020-10-28 MED ORDER — NITROFURANTOIN MONOHYD MACRO 100 MG PO CAPS: 100.0000 mg | ORAL_CAPSULE | Freq: Two times a day (BID) | ORAL | 0 refills | Status: DC

## 2020-10-28 NOTE — ED Triage Notes (Signed)
Lower abd pain, and back pain, nauseated since last night

## 2020-10-28 NOTE — Discharge Instructions (Addendum)
Urine culture sent.  We will call you with the results.   Push fluids and get plenty of rest.   Take antibiotic as directed and to completion Follow up with PCP if symptoms persists Return here or go to ER if you have any new or worsening symptoms such as fever, worsening abdominal pain, nausea/vomiting, flank pain, etc... 

## 2020-10-28 NOTE — ED Provider Notes (Signed)
[facility]   CC: UTI  SUBJECTIVE:  Valerie Robinson is a 35 y.o. female who presented to the urgent care with a complaint of lower abdominal and back pain that started last night.  Patient denies a precipitating event, recent sexual encounter, excessive caffeine intake.  Localizes the pain to the lower lower abdomen and back.  Pain is  intermittent and aching character.  Has tried OTC medications without relief.  Symptoms are made worse with urination.  Admits to similar symptoms in the past.  Denies fever, chills, nausea, vomiting, flank pain, abnormal vaginal discharge or bleeding, hematuria.    LMP: Patient's last menstrual period was 10/07/2020.  ROS: As in HPI.  All other pertinent ROS negative.     Past Medical History:  Diagnosis Date  . Anxiety   . Bradycardia    pt says was seen at [facility] for bradycardia but was never put on any medication for it.  . Chronic back pain   . Chronic pelvic pain in female   . Drug-seeking behavior 2015  . Ovarian cyst   . Renal disorder    kidney stone   Past Surgical History:  Procedure Laterality Date  . APPENDECTOMY    . CESAREAN SECTION     x2  . CHOLECYSTECTOMY N/A 03/03/2013   Procedure: LAPAROSCOPIC CHOLECYSTECTOMY;  Surgeon: Dalia Heading, MD;  Location: [facility];  Service: General;  Laterality: N/A;  . CHOLECYSTECTOMY  2014   Allergies  Allergen Reactions  . Gabapentin Rash  . Naprosyn [Naproxen] Hives, Nausea And Vomiting and Rash  . Tramadol Rash   No current facility-administered medications on file prior to encounter.   Current Outpatient Medications on File Prior to Encounter  Medication Sig Dispense Refill  . acetaminophen (TYLENOL) 500 MG tablet Take 1,000 mg by mouth every 4 (four) hours as needed for moderate pain.     Marland Kitchen amoxicillin (AMOXIL) 500 MG capsule Take 1 capsule (500 mg total) by mouth 3 (three) times daily. (Patient not taking: Reported on 05/15/2019) 21 capsule 0  . cyclobenzaprine (FLEXERIL) 10  MG tablet Take 1 tablet (10 mg total) by mouth 2 (two) times daily as needed for muscle spasms. (Patient not taking: Reported on 05/24/2018) 20 tablet 0  . dexamethasone (DECADRON) 4 MG tablet Take 1 tablet (4 mg total) by mouth 2 (two) times daily with a meal. (Patient not taking: Reported on 05/15/2019) 10 tablet 0  . HYDROcodone-acetaminophen (NORCO/VICODIN) 5-325 MG tablet Take one tab po q 4 hrs prn pain (Patient not taking: Reported on 05/15/2019) 12 tablet 0  . lidocaine (LIDODERM) 5 % Place 1 patch onto the skin daily. Remove & Discard patch within 12 hours or as directed by MD (Patient not taking: Reported on 05/24/2018) 30 patch 0  . magic mouthwash w/lidocaine SOLN Take 5 mLs by mouth 3 (three) times daily as needed for mouth pain. Swish and spit, do not swallow (Patient not taking: Reported on 05/15/2019) 100 mL 0  . ondansetron (ZOFRAN) 4 MG tablet Take 1 tablet (4 mg total) by mouth every 6 (six) hours. 12 tablet 0   Social History   Socioeconomic History  . Marital status: Single    Spouse name: Not on file  . Number of children: Not on file  . Years of education: Not on file  . Highest education level: Not on file  Occupational History  . Not on file  Tobacco Use  . Smoking status: Current Every Day Smoker    Packs/day:  0.50    Years: 3.00    Pack years: 1.50    Types: Cigarettes  . Smokeless tobacco: Never Used  Vaping Use  . Vaping Use: Every day  . Substances: Nicotine, Flavoring  Substance and Sexual Activity  . Alcohol use: No  . Drug use: No  . Sexual activity: Yes    Birth control/protection: None  Other Topics Concern  . Not on file  Social History Narrative  . Not on file   Social Determinants of Health   Financial Resource Strain: Not on file  Food Insecurity: Not on file  Transportation Needs: Not on file  Physical Activity: Not on file  Stress: Not on file  Social Connections: Not on file  Intimate Partner Violence: Not on file   History reviewed.  No pertinent family history.  OBJECTIVE:  Vitals:   10/28/20 1506  BP: (!) 107/55  Pulse: 78  Resp: 18  Temp: 98.2 F (36.8 C)  TempSrc: Oral  SpO2: 96%   General appearance: AOx3 in no acute distress HEENT: NCAT.  Oropharynx clear.  Lungs: clear to auscultation bilaterally without adventitious breath sounds Heart: regular rate and rhythm.  Radial pulses 2+ symmetrical bilaterally Abdomen: soft; non-distended; no tenderness; bowel sounds present; no guarding or rebound tenderness Back: no CVA tenderness Extremities: no edema; symmetrical with no gross deformities Skin: warm and dry Neurologic: Ambulates from chair to exam table without difficulty Psychological: alert and cooperative; normal mood and affect  Labs Reviewed  POCT URINALYSIS DIP (MANUAL ENTRY) - Abnormal; Notable for the following components:      Result Value   Clarity, UA cloudy (*)    Spec Grav, UA >=1.030 (*)    Blood, UA trace-intact (*)    Leukocytes, UA Small (1+) (*)    All other components within normal limits  URINE CULTURE    ASSESSMENT & PLAN:  1. Acute UTI   2. Lower abdominal pain   3. Acute low back pain without sciatica, unspecified back pain laterality     Meds ordered this encounter  Medications  . nitrofurantoin, macrocrystal-monohydrate, (MACROBID) 100 MG capsule    Sig: Take 1 capsule (100 mg total) by mouth 2 (two) times daily.    Dispense:  10 capsule    Refill:  0   Discharge instructions  Urine culture sent.  We will call you with the results.   Push fluids and get plenty of rest.   Take antibiotic as directed and to completion Follow up with PCP if symptoms persists Return here or go to ER if you have any new or worsening symptoms such as fever, worsening abdominal pain, nausea/vomiting, flank pain, etc...  Outlined signs and symptoms indicating need for more acute intervention. Patient verbalized understanding. After Visit Summary given.     Durward Parcel,  FNP 10/28/20 [phone]

## 2020-10-31 LAB — URINE CULTURE: Culture: >=100000 — AB

## 2021-01-13 ENCOUNTER — Ambulatory Visit
Admission: EM | Admit: 2021-01-13 | Discharge: 2021-01-13 | Disposition: A | Discharge status: Home / Self Care | Payer: Medicaid Other | Attending: Family Medicine | Admitting: Family Medicine

## 2021-01-13 ENCOUNTER — Other Ambulatory Visit: Payer: Self-pay

## 2021-01-13 DIAGNOSIS — G43801 Other migraine, not intractable, with status migrainosus: Secondary | ICD-10-CM

## 2021-01-13 MED ORDER — SUMATRIPTAN SUCCINATE 6 MG/0.5ML ~~LOC~~ SOLN
6.0000 mg | Freq: Once | SUBCUTANEOUS | Status: AC
  Administered 2021-01-13: 6 mg via SUBCUTANEOUS

## 2021-01-13 MED ORDER — ONDANSETRON HCL 4 MG PO TABS: 4.0000 mg | ORAL_TABLET | Freq: Four times a day (QID) | ORAL | 0 refills | Status: DC

## 2021-01-13 MED ORDER — DEXAMETHASONE SODIUM PHOSPHATE 10 MG/ML IJ SOLN
10.0000 mg | Freq: Once | INTRAMUSCULAR | Status: AC
  Administered 2021-01-13: 10 mg via INTRAMUSCULAR

## 2021-01-13 NOTE — ED Triage Notes (Signed)
Pt presents with c/o headache that began Friday and states pain has caused vomiting, has h/o migraines

## 2021-01-13 NOTE — Discharge Instructions (Signed)
You have received a steroid and imitrex in the office today   I have sent in Zofran for you to take one tablet every 8 hours as needed for nausea.  Follow up with this office or with primary care if symptoms are persisting.  Follow up in the ER for high fever, trouble swallowing, trouble breathing, other concerning symptoms.

## 2021-01-13 NOTE — ED Provider Notes (Signed)
[facility]    CSN: [id number] Arrival date & time: 01/13/21  0911      History   Chief Complaint Chief Complaint  Patient presents with  . Headache  . Emesis    HPI Valerie Robinson is a 35 y.o. female.   Reports headache that began 3 days ago. Reports pain has caused nausea and vomiting as well. Has hx migraines. Has taken Excedrin migraine and motrin with no relief. Denies sick contacts. Does not take prescription migraine preventative or abortive therapy. Pain is worse with light and loud noise. Denies abdominal pain, diarrhea, rash, fever, other symptoms.   ROS per HPI  The history is provided by the patient.  Headache Associated symptoms: vomiting   Emesis Associated symptoms: headaches     Past Medical History:  Diagnosis Date  . Anxiety   . Bradycardia    pt says was seen at [facility] for bradycardia but was never put on any medication for it.  . Chronic back pain   . Chronic pelvic pain in female   . Drug-seeking behavior 2015  . Ovarian cyst   . Renal disorder    kidney stone    There are no problems to display for this patient.   Past Surgical History:  Procedure Laterality Date  . APPENDECTOMY    . CESAREAN SECTION     x2  . CHOLECYSTECTOMY N/A 03/03/2013   Procedure: LAPAROSCOPIC CHOLECYSTECTOMY;  Surgeon: Dalia Heading, MD;  Location: [facility];  Service: General;  Laterality: N/A;  . CHOLECYSTECTOMY  2014    OB History    Gravida  4   Para  3   Term  3   Preterm      AB      Living  3     SAB      IAB      Ectopic      Multiple      Live Births               Home Medications    Prior to Admission medications   Medication Sig Start Date End Date Taking? Authorizing Provider  ondansetron (ZOFRAN) 4 MG tablet Take 1 tablet (4 mg total) by mouth every 6 (six) hours. 01/13/21  Yes Moshe Cipro, NP  acetaminophen (TYLENOL) 500 MG tablet Take 1,000 mg by mouth every 4 (four) hours as needed for moderate pain.      [provider]  amoxicillin (AMOXIL) 500 MG capsule Take 1 capsule (500 mg total) by mouth 3 (three) times daily. Patient not taking: Reported on 05/15/2019 09/19/18   Triplett, Tammy, PA-C  cyclobenzaprine (FLEXERIL) 10 MG tablet Take 1 tablet (10 mg total) by mouth 2 (two) times daily as needed for muscle spasms. Patient not taking: Reported on 05/24/2018 03/20/18   Margarita Grizzle, MD  dexamethasone (DECADRON) 4 MG tablet Take 1 tablet (4 mg total) by mouth 2 (two) times daily with a meal. Patient not taking: Reported on 05/15/2019 06/21/18   Ivery Quale, PA-C  HYDROcodone-acetaminophen (NORCO/VICODIN) 5-325 MG tablet Take one tab po q 4 hrs prn pain Patient not taking: Reported on 05/15/2019 11/23/18   Terrilee Files, MD  lidocaine (LIDODERM) 5 % Place 1 patch onto the skin daily. Remove & Discard patch within 12 hours or as directed by MD Patient not taking: Reported on 05/24/2018 03/20/18   Margarita Grizzle, MD  magic mouthwash w/lidocaine SOLN Take 5 mLs by mouth 3 (three) times daily as needed for  mouth pain. Swish and spit, do not swallow Patient not taking: Reported on 05/15/2019 09/19/18   Triplett, Tammy, PA-C  nitrofurantoin, macrocrystal-monohydrate, (MACROBID) 100 MG capsule Take 1 capsule (100 mg total) by mouth 2 (two) times daily. 10/28/20   Avegno, Zachery Dakins, FNP    Family History Family History  Family history unknown: Yes    Social History Social History   Tobacco Use  . Smoking status: Current Every Day Smoker    Packs/day: 0.50    Years: 3.00    Pack years: 1.50    Types: Cigarettes  . Smokeless tobacco: Never Used  Vaping Use  . Vaping Use: Every day  . Substances: Nicotine, Flavoring  Substance Use Topics  . Alcohol use: No  . Drug use: No     Allergies   Gabapentin, Naprosyn [naproxen], and Tramadol   Review of Systems Review of Systems  Gastrointestinal: Positive for vomiting.  Neurological: Positive for headaches.     Physical  Exam Triage Vital Signs ED Triage Vitals [01/13/21 1020]  Enc Vitals Group     BP (!) 112/58     Pulse Rate (!) 41     Resp 15     Temp 98.3 F (36.8 C)     Temp Source Oral     SpO2 98 %     Weight      Height      Head Circumference      Peak Flow      Pain Score      Pain Loc      Pain Edu?      Excl. in GC?    No data found.  Updated Vital Signs BP (!) 112/58 (BP Location: Right Arm)   Pulse (!) 41   Temp 98.3 F (36.8 C) (Oral)   Resp 15   LMP  (LMP Unknown)   SpO2 98%       Physical Exam Vitals and nursing note reviewed.  Constitutional:      General: She is not in acute distress.    Appearance: She is well-developed. She is ill-appearing.  HENT:     Head: Normocephalic and atraumatic.     Nose: Nose normal.     Mouth/Throat:     Mouth: Mucous membranes are moist.     Pharynx: Oropharynx is clear.  Eyes:     Extraocular Movements: Extraocular movements intact.     Conjunctiva/sclera: Conjunctivae normal.     Pupils: Pupils are equal, round, and reactive to light.  Cardiovascular:     Rate and Rhythm: Normal rate and regular rhythm.     Heart sounds: Normal heart sounds. No murmur heard.   Pulmonary:     Effort: Pulmonary effort is normal. No respiratory distress.     Breath sounds: Normal breath sounds.  Abdominal:     General: There is no distension.     Palpations: Abdomen is soft. There is no mass.     Tenderness: There is no abdominal tenderness. There is no right CVA tenderness, left CVA tenderness, guarding or rebound.     Hernia: No hernia is present.  Musculoskeletal:        General: Normal range of motion.     Cervical back: Normal range of motion and neck supple.  Skin:    General: Skin is warm and dry.     Capillary Refill: Capillary refill takes less than 2 seconds.  Neurological:     General: No focal deficit present.  Mental Status: She is alert and oriented to person, place, and time.  Psychiatric:        Mood and Affect:  Mood normal.        Behavior: Behavior normal.        Thought Content: Thought content normal.      UC Treatments / Results  Labs (all labs ordered are listed, but only abnormal results are displayed) Labs Reviewed - No data to display  EKG   Radiology No results found.  Procedures Procedures (including critical care time)  Medications Ordered in UC Medications  dexamethasone (DECADRON) injection 10 mg (has no administration in time range)  SUMAtriptan (IMITREX) injection 6 mg (has no administration in time range)    Initial Impression / Assessment and Plan / UC Course  I have reviewed the triage vital signs and the nursing notes.  Pertinent labs & imaging results that were available during my care of the patient were reviewed by me and considered in my medical decision making (see chart for details).    Migraine  Decaron 10mg  IM in office today Sumatriptan 6mg  SQ in office today Zofran prescribed for nausea prn Work note provided Follow up with this office or with primary care if symptoms are persisting.  Follow up in the ER for high fever, trouble swallowing, trouble breathing, other concerning symptoms.   Final Clinical Impressions(s) / UC Diagnoses   Final diagnoses:  Other migraine with status migrainosus, not intractable     Discharge Instructions     You have received a steroid and imitrex in the office today   I have sent in Zofran for you to take one tablet every 8 hours as needed for nausea.  Follow up with this office or with primary care if symptoms are persisting.  Follow up in the ER for high fever, trouble swallowing, trouble breathing, other concerning symptoms.     ED Prescriptions    Medication Sig Dispense Auth. Provider   ondansetron (ZOFRAN) 4 MG tablet Take 1 tablet (4 mg total) by mouth every 6 (six) hours. 12 tablet , NP     PDMP not reviewed this encounter.   , NP 01/13/21 1039

## 2021-05-07 ENCOUNTER — Encounter: Payer: Self-pay | Admitting: Emergency Medicine

## 2021-05-07 ENCOUNTER — Ambulatory Visit
Admission: EM | Admit: 2021-05-07 | Discharge: 2021-05-07 | Disposition: A | Discharge status: Home / Self Care | Payer: Medicaid Other | Attending: Family Medicine | Admitting: Family Medicine

## 2021-05-07 ENCOUNTER — Other Ambulatory Visit: Payer: Self-pay

## 2021-05-07 DIAGNOSIS — K529 Noninfective gastroenteritis and colitis, unspecified: Secondary | ICD-10-CM

## 2021-05-07 MED ORDER — ONDANSETRON 4 MG PO TBDP: 4.0000 mg | ORAL_TABLET | Freq: Three times a day (TID) | ORAL | 0 refills | Status: DC | PRN

## 2021-05-07 NOTE — Discharge Instructions (Signed)

## 2021-05-07 NOTE — ED Provider Notes (Signed)
[facility]   [id number] 05/07/21 Arrival Time: 1245  ASSESSMENT & PLAN:  1. Gastroenteritis    No signs of dehydration requiring IVF.  Meds ordered this encounter  Medications   ondansetron (ZOFRAN-ODT) 4 MG disintegrating tablet    Sig: Take 1 tablet (4 mg total) by mouth every 8 (eight) hours as needed for nausea or vomiting.    Dispense:  15 tablet    Refill:  0   Discussed typical duration of symptoms for suspected viral GI illness. Will do her best to ensure adequate fluid intake in order to avoid dehydration. Will proceed to the Emergency Department for evaluation if unable to tolerate PO fluids regularly.  Otherwise she will f/u with her PCP or here if not showing improvement over the next 48-72 hours.  Reviewed expectations re: course of current medical issues. Questions answered. Outlined signs and symptoms indicating need for more acute intervention. Patient verbalized understanding. After Visit Summary given.   SUBJECTIVE: History from: patient.  Valerie Robinson is a 35 y.o. female who presents with complaint of non-bilious, non-bloody n/v with non-bloody diarrhea. Onset  over past 24 hours . Abdominal discomfort: mild and cramping. Symptoms are unchanged since beginning. Aggravating factors: eating. Alleviating factors: none. Associated symptoms: fatigue. She denies arthralgias, dysuria, fever, melena, and myalgias. Appetite: decreased. PO intake: decreased. Ambulatory without assistance. Urinary symptoms: none. Sick contacts: none. Recent travel or camping: none. OTC treatment: none.  Patient's last menstrual period was 04/06/2021 (exact date).    OBJECTIVE:  Vitals:   05/07/21 1027  BP: 110/73  Pulse: 67  Resp: 18  Temp: 98.4 F (36.9 C)  TempSrc: Oral  SpO2: 98%    General appearance: alert; no distress Oropharynx: moist Lungs: clear to auscultation bilaterally; unlabored Heart: regular Abdomen: soft; non-distended; no significant  abdominal tenderness; reports "cramping" feeling; bowel sounds present; no masses or organomegaly; no guarding or rebound tenderness Back: no CVA tenderness Extremities: no edema; symmetrical with no gross deformities Skin: warm; dry Neurologic: normal gait Psychological: alert and cooperative; normal mood and affect  Labs: Results for orders placed or performed during the hospital encounter of 10/28/20  Urine Culture   Specimen: Urine, Random  Result Value Ref Range   Specimen Description      URINE, RANDOM Performed at [facility], [address] [place], [place] [address]    Special Requests      NONE Performed at [facility], [address], [place], [place] [address]    Culture >=100,000 COLONIES/mL ESCHERICHIA COLI (A)    Report Status 10/31/2020 FINAL    Organism ID, Bacteria ESCHERICHIA COLI (A)       Susceptibility   Escherichia coli - MIC*    AMPICILLIN 4 SENSITIVE Sensitive     CEFAZOLIN <=4 SENSITIVE Sensitive     CEFEPIME <=0.12 SENSITIVE Sensitive     CEFTRIAXONE <=0.25 SENSITIVE Sensitive     CIPROFLOXACIN <=0.25 SENSITIVE Sensitive     GENTAMICIN <=1 SENSITIVE Sensitive     IMIPENEM <=0.25 SENSITIVE Sensitive     NITROFURANTOIN <=16 SENSITIVE Sensitive     TRIMETH/SULFA <=20 SENSITIVE Sensitive     AMPICILLIN/SULBACTAM <=2 SENSITIVE Sensitive     PIP/TAZO <=4 SENSITIVE Sensitive     * >=100,000 COLONIES/mL ESCHERICHIA COLI  POCT urinalysis dipstick  Result Value Ref Range   Color, UA yellow yellow   Clarity, UA cloudy (A) clear   Glucose, UA negative negative mg/dL   Bilirubin, UA negative negative   Ketones, POC UA  negative negative mg/dL   Spec Grav, UA >=6.967 (A) 1.010 - 1.025   Blood, UA trace-intact (A) negative   pH, UA 6.0 5.0 - 8.0   Protein Ur, POC negative negative mg/dL   Urobilinogen, UA 0.2 0.2 or 1.0 E.U./dL   Nitrite, UA Negative Negative   Leukocytes, UA Small (1+) (A) Negative   Labs Reviewed - No data to  display  Imaging: No results found.  Allergies  Allergen Reactions   Gabapentin Rash   Naprosyn [Naproxen] Hives, Nausea And Vomiting and Rash   Tramadol Rash                                               Past Medical History:  Diagnosis Date   Anxiety    Bradycardia    pt says was seen at [facility] for bradycardia but was never put on any medication for it.   Chronic back pain    Chronic pelvic pain in female    Drug-seeking behavior 2015   Ovarian cyst    Renal disorder    kidney stone   Social History   Socioeconomic History   Marital status: Single    Spouse name: Not on file   Number of children: Not on file   Years of education: Not on file   Highest education level: Not on file  Occupational History   Not on file  Tobacco Use   Smoking status: Every Day    Packs/day: 0.50    Years: 3.00    Pack years: 1.50    Types: Cigarettes   Smokeless tobacco: Never  Vaping Use   Vaping Use: Every day   Substances: Nicotine, Flavoring  Substance and Sexual Activity   Alcohol use: No   Drug use: No   Sexual activity: Yes    Birth control/protection: None  Other Topics Concern   Not on file  Social History Narrative   Not on file   Social Determinants of Health   Financial Resource Strain: Not on file  Food Insecurity: Not on file  Transportation Needs: Not on file  Physical Activity: Not on file  Stress: Not on file  Social Connections: Not on file  Intimate Partner Violence: Not on file   Family History  Family history unknown: Yes      Mardella Layman, MD 05/07/21 1631

## 2021-05-07 NOTE — ED Triage Notes (Signed)
Chills, vomiting and diarrhea since this morning.  Took covid test this morning and it was negative.  Hx of covid last month.

## 2021-05-29 ENCOUNTER — Ambulatory Visit
Admission: EM | Admit: 2021-05-29 | Discharge: 2021-05-29 | Disposition: A | Discharge status: Home / Self Care | Payer: Medicaid Other | Attending: Internal Medicine | Admitting: Internal Medicine

## 2021-05-29 ENCOUNTER — Other Ambulatory Visit: Payer: Self-pay

## 2021-05-29 DIAGNOSIS — H9201 Otalgia, right ear: Secondary | ICD-10-CM

## 2021-05-29 NOTE — Discharge Instructions (Signed)
Please take ibuprofen as needed for ear pain Return to urgent care if symptoms worsen.

## 2021-05-29 NOTE — ED Triage Notes (Signed)
Onset yesterday of bilateral ear pain, right > left. Pain is interfering with her sleep. Has been taking tylenol and applying alcohol to her ear without relief. Needs work note.

## 2021-05-29 NOTE — ED Provider Notes (Signed)
[facility]    CSN: [id number] Arrival date & time: 05/29/21  1518      History   Chief Complaint Chief Complaint  Patient presents with   Otalgia    bilateral    HPI Valerie Robinson is a 35 y.o. female comes to the urgent care complaining of bilateral ear pain of 1 day duration.  Onset was fairly sudden and has been persistent.  She denies any hearing loss or ringing in both ears.  No dizziness or vertigo.  No fever or chills.  No trauma to the ear.  No sore throat.  Pain is constant, sharp with no known aggravating or relieving factors. HPI  Past Medical History:  Diagnosis Date   Anxiety    Bradycardia    pt says was seen at [facility] for bradycardia but was never put on any medication for it.   Chronic back pain    Chronic pelvic pain in female    Drug-seeking behavior 2015   Ovarian cyst    Renal disorder    kidney stone    There are no problems to display for this patient.   Past Surgical History:  Procedure Laterality Date   APPENDECTOMY     CESAREAN SECTION     x2   CHOLECYSTECTOMY N/A 03/03/2013   Procedure: LAPAROSCOPIC CHOLECYSTECTOMY;  Surgeon: Dalia Heading, MD;  Location: [facility];  Service: General;  Laterality: N/A;   CHOLECYSTECTOMY  2014    OB History     Gravida  4   Para  3   Term  3   Preterm      AB      Living  3      SAB      IAB      Ectopic      Multiple      Live Births               Home Medications    Prior to Admission medications   Medication Sig Start Date End Date Taking? Authorizing Provider  albuterol (VENTOLIN HFA) 108 (90 Base) MCG/ACT inhaler Inhale into the lungs. 04/13/21 04/13/22 Yes [provider]  acetaminophen (TYLENOL) 500 MG tablet Take 1,000 mg by mouth every 4 (four) hours as needed for moderate pain.     [provider]  ondansetron (ZOFRAN-ODT) 4 MG disintegrating tablet Take 1 tablet (4 mg total) by mouth every 8 (eight) hours as needed for nausea or vomiting.  05/07/21   Mardella Layman, MD  PROAIR HFA 108 [phone] Base) MCG/ACT inhaler SMARTSIG:2 Puff(s) By Mouth Every 4 Hours PRN 04/13/21   [provider]    Family History Family History  Family history unknown: Yes    Social History Social History   Tobacco Use   Smoking status: Every Day    Packs/day: 0.50    Years: 3.00    Pack years: 1.50    Types: Cigarettes   Smokeless tobacco: Never  Vaping Use   Vaping Use: Every day   Substances: Nicotine, Flavoring  Substance Use Topics   Alcohol use: No   Drug use: No     Allergies   Gabapentin, Naprosyn [naproxen], and Tramadol   Review of Systems Review of Systems  HENT:  Positive for ear pain. Negative for congestion, ear discharge, facial swelling and sore throat.   Respiratory: Negative.    Cardiovascular: Negative.     Physical Exam Triage Vital Signs ED Triage Vitals  Enc Vitals Group  BP 05/29/21 1525 106/71     Pulse Rate 05/29/21 1525 70     Resp 05/29/21 1525 18     Temp 05/29/21 1525 98.1 F (36.7 C)     Temp Source 05/29/21 1525 Oral     SpO2 05/29/21 1525 97 %     Weight --      Height --      Head Circumference --      Peak Flow --      Pain Score 05/29/21 1526 8     Pain Loc --      Pain Edu? --      Excl. in GC? --    No data found.  Updated Vital Signs BP 106/71 (BP Location: Right Arm)   Pulse 70   Temp 98.1 F (36.7 C) (Oral)   Resp 18   LMP  (LMP Unknown)   SpO2 97%   Breastfeeding No   Visual Acuity Right Eye Distance:   Left Eye Distance:   Bilateral Distance:    Right Eye Near:   Left Eye Near:    Bilateral Near:     Physical Exam Vitals and nursing note reviewed.  Constitutional:      General: She is not in acute distress.    Appearance: She is not ill-appearing.  HENT:     Right Ear: Tympanic membrane and ear canal normal. There is no impacted cerumen.     Left Ear: Tympanic membrane and ear canal normal. There is no impacted cerumen.     Nose: Nose normal. No  congestion.  Cardiovascular:     Rate and Rhythm: Normal rate and regular rhythm.     Pulses: Normal pulses.     Heart sounds: Normal heart sounds.  Neurological:     Mental Status: She is alert.     UC Treatments / Results  Labs (all labs ordered are listed, but only abnormal results are displayed) Labs Reviewed - No data to display  EKG   Radiology No results found.  Procedures Procedures (including critical care time)  Medications Ordered in UC Medications - No data to display  Initial Impression / Assessment and Plan / UC Course  I have reviewed the triage vital signs and the nursing notes.  Pertinent labs & imaging results that were available during my care of the patient were reviewed by me and considered in my medical decision making (see chart for details).     1.  Otalgia of the right ear: Ibuprofen as needed for pain. Return to urgent care if symptoms worsen Your ear exam is normal. Final Clinical Impressions(s) / UC Diagnoses   Final diagnoses:  Otalgia of right ear     Discharge Instructions      Please take ibuprofen as needed for ear pain Return to urgent care if symptoms worsen.   ED Prescriptions   None    PDMP not reviewed this encounter.   Merrilee Jansky, MD 05/29/21 [phone]

## 2021-06-11 ENCOUNTER — Emergency Department (HOSPITAL_COMMUNITY): Payer: Medicaid Other

## 2021-06-11 ENCOUNTER — Emergency Department (HOSPITAL_COMMUNITY)
Admission: EM | Admit: 2021-06-11 | Discharge: 2021-06-11 | Disposition: A | Discharge status: Home / Self Care | Payer: Medicaid Other | Attending: Emergency Medicine | Admitting: Emergency Medicine

## 2021-06-11 ENCOUNTER — Encounter (HOSPITAL_COMMUNITY): Payer: Self-pay | Admitting: *Deleted

## 2021-06-11 ENCOUNTER — Other Ambulatory Visit: Payer: Self-pay

## 2021-06-11 DIAGNOSIS — M545 Low back pain, unspecified: Secondary | ICD-10-CM | POA: Diagnosis present

## 2021-06-11 DIAGNOSIS — M5430 Sciatica, unspecified side: Secondary | ICD-10-CM

## 2021-06-11 DIAGNOSIS — M5431 Sciatica, right side: Secondary | ICD-10-CM | POA: Insufficient documentation

## 2021-06-11 DIAGNOSIS — F17210 Nicotine dependence, cigarettes, uncomplicated: Secondary | ICD-10-CM | POA: Insufficient documentation

## 2021-06-11 MED ORDER — METHYLPREDNISOLONE SODIUM SUCC 125 MG IJ SOLR
125.0000 mg | Freq: Once | INTRAMUSCULAR | Status: AC
  Administered 2021-06-11: 125 mg via INTRAVENOUS
  Filled 2021-06-11: qty 2

## 2021-06-11 MED ORDER — LORAZEPAM 2 MG/ML IJ SOLN
0.5000 mg | Freq: Once | INTRAMUSCULAR | Status: AC
  Administered 2021-06-11: 0.5 mg via INTRAVENOUS
  Filled 2021-06-11: qty 1

## 2021-06-11 MED ORDER — CYCLOBENZAPRINE HCL 10 MG PO TABS: 10.0000 mg | ORAL_TABLET | Freq: Three times a day (TID) | ORAL | 0 refills | Status: DC | PRN

## 2021-06-11 MED ORDER — HYDROCODONE-ACETAMINOPHEN 5-325 MG PO TABS
1.0000 | ORAL_TABLET | Freq: Four times a day (QID) | ORAL | 0 refills | Status: DC | PRN
Start: 2021-06-11 — End: 2021-10-31

## 2021-06-11 MED ORDER — HYDROMORPHONE HCL 1 MG/ML IJ SOLN
0.5000 mg | Freq: Once | INTRAMUSCULAR | Status: AC
  Administered 2021-06-11: 0.5 mg via INTRAVENOUS
  Filled 2021-06-11: qty 1

## 2021-06-11 MED ORDER — PREDNISONE 10 MG PO TABS: 20.0000 mg | ORAL_TABLET | Freq: Every day | ORAL | 0 refills | Status: DC

## 2021-06-11 NOTE — ED Provider Notes (Signed)
[facility] EMERGENCY DEPARTMENT Provider Note   CSN: [id number] Arrival date & time: 06/11/21  5053     History Chief Complaint  Patient presents with   Back Pain    Valerie Robinson is a 35 y.o. female.  Patient complains of back pain with radiating down her right leg  The history is provided by the patient and medical records. No language interpreter was used.  Back Pain Location:  Lumbar spine Quality:  Aching Radiates to:  R thigh Pain severity:  Moderate Pain is:  Worse during the day Onset quality:  Sudden Timing:  Constant Progression:  Worsening Chronicity:  New Context: not emotional stress   Relieved by:  Nothing Worsened by:  Nothing Associated symptoms: no abdominal pain, no chest pain and no headaches       Past Medical History:  Diagnosis Date   Anxiety    Bradycardia    pt says was seen at [facility] for bradycardia but was never put on any medication for it.   Chronic back pain    Chronic pelvic pain in female    Drug-seeking behavior 2015   Ovarian cyst    Renal disorder    kidney stone    There are no problems to display for this patient.   Past Surgical History:  Procedure Laterality Date   APPENDECTOMY     CESAREAN SECTION     x2   CHOLECYSTECTOMY N/A 03/03/2013   Procedure: LAPAROSCOPIC CHOLECYSTECTOMY;  Surgeon: Dalia Heading, MD;  Location: [facility];  Service: General;  Laterality: N/A;   CHOLECYSTECTOMY  2014     OB History     Gravida  4   Para  3   Term  3   Preterm      AB      Living  3      SAB      IAB      Ectopic      Multiple      Live Births              Family History  Family history unknown: Yes    Social History   Tobacco Use   Smoking status: Every Day    Packs/day: 0.50    Years: 3.00    Pack years: 1.50    Types: Cigarettes   Smokeless tobacco: Never  Vaping Use   Vaping Use: Former   Substances: Nicotine, Flavoring  Substance Use Topics   Alcohol use: No   Drug use: No     Home Medications Prior to Admission medications   Medication Sig Start Date End Date Taking? Authorizing Provider  cyclobenzaprine (FLEXERIL) 10 MG tablet Take 1 tablet (10 mg total) by mouth 3 (three) times daily as needed for muscle spasms. 06/11/21  Yes Bethann Berkshire, MD  HYDROcodone-acetaminophen (NORCO/VICODIN) 5-325 MG tablet Take 1 tablet by mouth every 6 (six) hours as needed for moderate pain. 06/11/21  Yes Bethann Berkshire, MD  predniSONE (DELTASONE) 10 MG tablet Take 2 tablets (20 mg total) by mouth daily. 06/11/21  Yes Bethann Berkshire, MD  acetaminophen (TYLENOL) 500 MG tablet Take 1,000 mg by mouth every 4 (four) hours as needed for moderate pain.     [provider]  albuterol (VENTOLIN HFA) 108 (90 Base) MCG/ACT inhaler Inhale into the lungs. 04/13/21 04/13/22  [provider]  ondansetron (ZOFRAN-ODT) 4 MG disintegrating tablet Take 1 tablet (4 mg total) by mouth every 8 (eight) hours as needed for nausea or vomiting.  05/07/21   Mardella Layman, MD  PROAIR HFA 108 [phone] Base) MCG/ACT inhaler SMARTSIG:2 Puff(s) By Mouth Every 4 Hours PRN 04/13/21   [provider]    Allergies    Gabapentin, Naprosyn [naproxen], and Tramadol  Review of Systems   Review of Systems  Constitutional:  Negative for appetite change and fatigue.  HENT:  Negative for congestion, ear discharge and sinus pressure.   Eyes:  Negative for discharge.  Respiratory:  Negative for cough.   Cardiovascular:  Negative for chest pain.  Gastrointestinal:  Negative for abdominal pain and diarrhea.  Genitourinary:  Negative for frequency and hematuria.  Musculoskeletal:  Positive for back pain.  Skin:  Negative for rash.  Neurological:  Negative for seizures and headaches.  Psychiatric/Behavioral:  Negative for hallucinations.    Physical Exam Updated Vital Signs BP 98/61   Pulse 72   Temp 98.7 F (37.1 C) (Oral)   Resp 18   Ht 5\' 6"  (1.676 m)   Wt 99.8 kg   LMP 06/11/2021  (Approximate)   SpO2 93%   BMI 35.51 kg/m   Physical Exam Vitals and nursing note reviewed.  Constitutional:      Appearance: She is well-developed.  HENT:     Head: Normocephalic.     Nose: Nose normal.  Eyes:     General: No scleral icterus.    Conjunctiva/sclera: Conjunctivae normal.  Neck:     Thyroid: No thyromegaly.  Cardiovascular:     Rate and Rhythm: Normal rate and regular rhythm.     Heart sounds: No murmur heard.   No friction rub. No gallop.  Pulmonary:     Breath sounds: No stridor. No wheezing or rales.  Chest:     Chest wall: No tenderness.  Abdominal:     General: There is no distension.     Tenderness: There is no abdominal tenderness. There is no rebound.  Musculoskeletal:     Cervical back: Neck supple.     Comments: Tender lumbar muscles with positive straight leg raise concern right  Lymphadenopathy:     Cervical: No cervical adenopathy.  Skin:    Findings: No erythema or rash.  Neurological:     Mental Status: She is alert and oriented to person, place, and time.     Motor: No abnormal muscle tone.     Coordination: Coordination normal.  Psychiatric:        Behavior: Behavior normal.    ED Results / Procedures / Treatments   Labs (all labs ordered are listed, but only abnormal results are displayed) Labs Reviewed - No data to display  EKG None  Radiology DG Lumbar Spine Complete  Result Date: 06/11/2021 CLINICAL DATA:  Right low back pain EXAM: LUMBAR SPINE - COMPLETE 4+ VIEW COMPARISON:  11/28/2016 FINDINGS: Very mild lumbar dextroscoliosis, new since previous. No underlying vertebral anomaly. No fracture or dislocation. Vertebral bodies and intervertebral disc height appears maintained throughout. No significant osseous degenerative change. Cholecystectomy clips. IMPRESSION: 1. Negative for fracture or significant bone abnormality. 2. Mild dextroscoliosis may be due to spasm or soft tissue injury Electronically Signed   By: 01/28/2017  M.D.   On: 06/11/2021 08:52    Procedures Procedures   Medications Ordered in ED Medications  HYDROmorphone (DILAUDID) injection 0.5 mg (0.5 mg Intravenous Given 06/11/21 0850)  methylPREDNISolone sodium succinate (SOLU-MEDROL) 125 mg/2 mL injection 125 mg (125 mg Intravenous Given 06/11/21 0848)  HYDROmorphone (DILAUDID) injection 0.5 mg (0.5 mg Intravenous Given 06/11/21 1007)  LORazepam (  ATIVAN) injection 0.5 mg (0.5 mg Intravenous Given 06/11/21 1006)    ED Course  I have reviewed the triage vital signs and the nursing notes.  Pertinent labs & imaging results that were available during my care of the patient were reviewed by me and considered in my medical decision making (see chart for details). Patient with sciatic pain.  X-ray negative.   MDM Rules/Calculators/A&P                           Sciatica.  Patient will be sent home with prednisone hydrocodone and Flexeril Final Clinical Impression(s) / ED Diagnoses Final diagnoses:  Sciatica, unspecified laterality    Rx / DC Orders ED Discharge Orders          Ordered    predniSONE (DELTASONE) 10 MG tablet  Daily        06/11/21 1125    HYDROcodone-acetaminophen (NORCO/VICODIN) 5-325 MG tablet  Every 6 hours PRN        06/11/21 1125    cyclobenzaprine (FLEXERIL) 10 MG tablet  3 times daily PRN        06/11/21 1125             Bethann Berkshire, MD 06/14/21 1010

## 2021-06-11 NOTE — Discharge Instructions (Signed)
Follow-up with Dr. Romeo Apple or one of his assistants next week for recheck.  Call for an appointment and tell them that he was seen at the emergency department and they want to recheck

## 2021-06-11 NOTE — ED Triage Notes (Signed)
Pt c/o right lower back pain that started last night after leaning down to pick up toys off the floor. Pt reports the pain started radiating down her right leg today. Pt has used Tylenol, Ibuprofen and warm compresses without relief.

## 2021-06-17 ENCOUNTER — Encounter: Payer: Self-pay | Admitting: Orthopedic Surgery

## 2021-06-17 ENCOUNTER — Ambulatory Visit: Payer: Medicaid Other | Admitting: Orthopedic Surgery

## 2021-07-21 ENCOUNTER — Ambulatory Visit
Admission: EM | Admit: 2021-07-21 | Discharge: 2021-07-21 | Disposition: A | Discharge status: Home / Self Care | Payer: Medicaid Other | Attending: Family Medicine | Admitting: Family Medicine

## 2021-07-21 ENCOUNTER — Other Ambulatory Visit: Payer: Self-pay

## 2021-07-21 DIAGNOSIS — R519 Headache, unspecified: Secondary | ICD-10-CM | POA: Diagnosis not present

## 2021-07-21 DIAGNOSIS — R110 Nausea: Secondary | ICD-10-CM

## 2021-07-21 DIAGNOSIS — R062 Wheezing: Secondary | ICD-10-CM | POA: Diagnosis not present

## 2021-07-21 DIAGNOSIS — R5383 Other fatigue: Secondary | ICD-10-CM

## 2021-07-21 MED ORDER — DEXAMETHASONE SODIUM PHOSPHATE 10 MG/ML IJ SOLN
10.0000 mg | Freq: Once | INTRAMUSCULAR | Status: AC
  Administered 2021-07-21: 19:00:00 10 mg via INTRAMUSCULAR

## 2021-07-21 MED ORDER — ONDANSETRON 4 MG PO TBDP: 4.0000 mg | ORAL_TABLET | Freq: Three times a day (TID) | ORAL | 0 refills | Status: DC | PRN

## 2021-07-21 MED ORDER — SUMATRIPTAN SUCCINATE 50 MG PO TABS: ORAL_TABLET | ORAL | 0 refills | Status: DC

## 2021-07-21 NOTE — ED Triage Notes (Signed)
Pt presents with headache for past 3 days that hasnt been relieved with over the counter medication

## 2021-07-22 LAB — COVID-19, FLU A+B NAA
Influenza A, NAA: NOT DETECTED
Influenza B, NAA: NOT DETECTED
SARS-CoV-2, NAA: NOT DETECTED

## 2021-07-24 NOTE — ED Provider Notes (Signed)
[facility]    CSN: [id number] Arrival date & time: 07/21/21  1424      History   Chief Complaint Chief Complaint  Patient presents with   Headache    HPI Valerie Robinson is a 35 y.o. female.   Presenting today with 3 day history of headache, fatigue, nausea. Denies congestion, sore throat, cough, CP, SOB. So far trying OTC pain relievers with minimal relief. Significant other sick with flu like sxs. No known pertinent chronic medical problems per patient.    Past Medical History:  Diagnosis Date   Anxiety    Bradycardia    pt says was seen at [facility] for bradycardia but was never put on any medication for it.   Chronic back pain    Chronic pelvic pain in female    Drug-seeking behavior 2015   Ovarian cyst    Renal disorder    kidney stone    There are no problems to display for this patient.   Past Surgical History:  Procedure Laterality Date   APPENDECTOMY     CESAREAN SECTION     x2   CHOLECYSTECTOMY N/A 03/03/2013   Procedure: LAPAROSCOPIC CHOLECYSTECTOMY;  Surgeon: Dalia Heading, MD;  Location: [facility];  Service: General;  Laterality: N/A;   CHOLECYSTECTOMY  2014    OB History     Gravida  4   Para  3   Term  3   Preterm      AB      Living  3      SAB      IAB      Ectopic      Multiple      Live Births               Home Medications    Prior to Admission medications   Medication Sig Start Date End Date Taking? Authorizing Provider  SUMAtriptan (IMITREX) 50 MG tablet Take 1 tab at onset of headache May repeat in 2 hours if headache persists or recurs. Max of 2 tabs daily 07/21/21  Yes Particia Nearing, PA-C  acetaminophen (TYLENOL) 500 MG tablet Take 1,000 mg by mouth every 4 (four) hours as needed for moderate pain.     [provider]  albuterol (VENTOLIN HFA) 108 (90 Base) MCG/ACT inhaler Inhale into the lungs. 04/13/21 04/13/22  [provider]  cyclobenzaprine (FLEXERIL) 10 MG tablet Take  1 tablet (10 mg total) by mouth 3 (three) times daily as needed for muscle spasms. 06/11/21   Bethann Berkshire, MD  HYDROcodone-acetaminophen (NORCO/VICODIN) 5-325 MG tablet Take 1 tablet by mouth every 6 (six) hours as needed for moderate pain. 06/11/21   Bethann Berkshire, MD  ondansetron (ZOFRAN-ODT) 4 MG disintegrating tablet Take 1 tablet (4 mg total) by mouth every 8 (eight) hours as needed for nausea or vomiting. 07/21/21   Particia Nearing, PA-C  predniSONE (DELTASONE) 10 MG tablet Take 2 tablets (20 mg total) by mouth daily. 06/11/21   Bethann Berkshire, MD  PROAIR HFA 108 [phone] Base) MCG/ACT inhaler SMARTSIG:2 Puff(s) By Mouth Every 4 Hours PRN 04/13/21   [provider]    Family History Family History  Family history unknown: Yes    Social History Social History   Tobacco Use   Smoking status: Every Day    Packs/day: 0.50    Years: 3.00    Pack years: 1.50    Types: Cigarettes   Smokeless tobacco: Never  Vaping Use  Vaping Use: Former   Substances: Nicotine, Flavoring  Substance Use Topics   Alcohol use: No   Drug use: No     Allergies   Gabapentin, Naprosyn [naproxen], and Tramadol   Review of Systems Review of Systems PER HPI   Physical Exam Triage Vital Signs ED Triage Vitals  Enc Vitals Group     BP 07/21/21 1842 112/69     Pulse Rate 07/21/21 1842 (!) 51     Resp 07/21/21 1842 20     Temp 07/21/21 1842 98.1 F (36.7 C)     Temp src --      SpO2 07/21/21 1842 97 %     Weight --      Height --      Head Circumference --      Peak Flow --      Pain Score 07/21/21 1841 7     Pain Loc --      Pain Edu? --      Excl. in GC? --    No data found.  Updated Vital Signs BP 112/69   Pulse (!) 51   Temp 98.1 F (36.7 C)   Resp 20   LMP 06/20/2021   SpO2 97%   Visual Acuity Right Eye Distance:   Left Eye Distance:   Bilateral Distance:    Right Eye Near:   Left Eye Near:    Bilateral Near:     Physical Exam Vitals and nursing  note reviewed.  Constitutional:      Appearance: Normal appearance. She is not ill-appearing.  HENT:     Head: Atraumatic.     Right Ear: Tympanic membrane normal.     Left Ear: Tympanic membrane normal.     Nose: Nose normal.     Mouth/Throat:     Mouth: Mucous membranes are moist.     Pharynx: Oropharynx is clear. No posterior oropharyngeal erythema.  Eyes:     Extraocular Movements: Extraocular movements intact.     Conjunctiva/sclera: Conjunctivae normal.  Cardiovascular:     Rate and Rhythm: Normal rate and regular rhythm.     Heart sounds: Normal heart sounds.  Pulmonary:     Effort: Pulmonary effort is normal.     Breath sounds: Normal breath sounds.  Abdominal:     General: Bowel sounds are normal.     Palpations: Abdomen is soft.  Musculoskeletal:        General: Normal range of motion.     Cervical back: Normal range of motion and neck supple.  Skin:    General: Skin is warm and dry.  Neurological:     General: No focal deficit present.     Mental Status: She is alert and oriented to person, place, and time.     Cranial Nerves: No cranial nerve deficit.     Motor: No weakness.     Gait: Gait normal.  Psychiatric:        Mood and Affect: Mood normal.        Thought Content: Thought content normal.        Judgment: Judgment normal.     UC Treatments / Results  Labs (all labs ordered are listed, but only abnormal results are displayed) Labs Reviewed  COVID-19, FLU A+B NAA   Narrative:    Performed at:  [address] [place], [place]  [id number] Lab Director: Jolene Schimke MD, Phone:  [phone]    EKG   Radiology No results found.  Procedures Procedures (including critical care time)  Medications Ordered in UC Medications  dexamethasone (DECADRON) injection 10 mg (10 mg Intramuscular Given 07/21/21 1917)    Initial Impression / Assessment and Plan / UC Course  I have reviewed the triage vital signs and the nursing  notes.  Pertinent labs & imaging results that were available during my care of the patient were reviewed by me and considered in my medical decision making (see chart for details).     COVID and flu test pending, vitals reassuring today. Possibly early viral illness vs migraine. OTC pain relievers not successful so will try IM decadron in clinic. Will also try imitrex, zofran. Supportive care and return precautions reviewed.   Final Clinical Impressions(s) / UC Diagnoses   Final diagnoses:  Bad headache  Nausea without vomiting  Fatigue, unspecified type  Wheezing   Discharge Instructions   None    ED Prescriptions     Medication Sig Dispense Auth. Provider   ondansetron (ZOFRAN-ODT) 4 MG disintegrating tablet Take 1 tablet (4 mg total) by mouth every 8 (eight) hours as needed for nausea or vomiting. 15 tablet Particia Nearing, [place]   SUMAtriptan (IMITREX) 50 MG tablet Take 1 tab at onset of headache May repeat in 2 hours if headache persists or recurs. Max of 2 tabs daily 10 tablet Particia Nearing, [place]      PDMP not reviewed this encounter.   Particia Nearing, [place] 07/24/21 2317

## 2021-08-06 ENCOUNTER — Other Ambulatory Visit: Payer: Self-pay

## 2021-08-06 ENCOUNTER — Emergency Department (HOSPITAL_COMMUNITY): Payer: Medicaid Other

## 2021-08-06 ENCOUNTER — Emergency Department (HOSPITAL_COMMUNITY)
Admission: EM | Admit: 2021-08-06 | Discharge: 2021-08-06 | Disposition: A | Discharge status: Home / Self Care | Payer: Medicaid Other | Attending: Emergency Medicine | Admitting: Emergency Medicine

## 2021-08-06 ENCOUNTER — Encounter (HOSPITAL_COMMUNITY): Payer: Self-pay

## 2021-08-06 DIAGNOSIS — R102 Pelvic and perineal pain: Secondary | ICD-10-CM | POA: Diagnosis present

## 2021-08-06 DIAGNOSIS — F17210 Nicotine dependence, cigarettes, uncomplicated: Secondary | ICD-10-CM | POA: Insufficient documentation

## 2021-08-06 LAB — URINALYSIS, ROUTINE W REFLEX MICROSCOPIC
Bilirubin Urine: NEGATIVE
Glucose, UA: NEGATIVE mg/dL
Hgb urine dipstick: NEGATIVE
Ketones, ur: NEGATIVE mg/dL
Leukocytes,Ua: NEGATIVE
Nitrite: NEGATIVE
Protein, ur: NEGATIVE mg/dL
Specific Gravity, Urine: 1.002 — ABNORMAL LOW (ref 1.005–1.030)
pH: 6 (ref 5.0–8.0)

## 2021-08-06 LAB — COMPREHENSIVE METABOLIC PANEL
ALT: 14 U/L (ref 0–44)
AST: 12 U/L — ABNORMAL LOW (ref 15–41)
Albumin: 3.7 g/dL (ref 3.5–5.0)
Alkaline Phosphatase: 59 U/L (ref 38–126)
Anion gap: 7 (ref 5–15)
BUN: 5 mg/dL — ABNORMAL LOW (ref 6–20)
CO2: 24 mmol/L (ref 22–32)
Calcium: 8.7 mg/dL — ABNORMAL LOW (ref 8.9–10.3)
Chloride: 105 mmol/L (ref 98–111)
Creatinine, Ser: 0.59 mg/dL (ref 0.44–1.00)
GFR, Estimated: >60 mL/min (ref >60)
Glucose, Bld: 80 mg/dL (ref 70–99)
Potassium: 3.3 mmol/L — ABNORMAL LOW (ref 3.5–5.1)
Sodium: 136 mmol/L (ref 135–145)
Total Bilirubin: 0.4 mg/dL (ref 0.3–1.2)
Total Protein: 6.6 g/dL (ref 6.5–8.1)

## 2021-08-06 LAB — CBC WITH DIFFERENTIAL/PLATELET
Abs Immature Granulocytes: 0.02 10*3/uL (ref 0.00–0.07)
Basophils Absolute: 0 10*3/uL (ref 0.0–0.1)
Basophils Relative: 0 %
Eosinophils Absolute: 0.1 10*3/uL (ref 0.0–0.5)
Eosinophils Relative: 1 %
HCT: 39.2 % (ref 36.0–46.0)
Hemoglobin: 13.7 g/dL (ref 12.0–15.0)
Immature Granulocytes: 0 %
Lymphocytes Relative: 23 %
Lymphs Abs: 2.1 10*3/uL (ref 0.7–4.0)
MCH: 35 pg — ABNORMAL HIGH (ref 26.0–34.0)
MCHC: 34.9 g/dL (ref 30.0–36.0)
MCV: 100.3 fL — ABNORMAL HIGH (ref 80.0–100.0)
Monocytes Absolute: 0.5 10*3/uL (ref 0.1–1.0)
Monocytes Relative: 5 %
Neutro Abs: 6.4 10*3/uL (ref 1.7–7.7)
Neutrophils Relative %: 71 %
Platelets: 243 10*3/uL (ref 150–400)
RBC: 3.91 MIL/uL (ref 3.87–5.11)
RDW: 12.1 % (ref 11.5–15.5)
WBC: 9.1 10*3/uL (ref 4.0–10.5)
nRBC: 0 % (ref 0.0–0.2)

## 2021-08-06 LAB — POC URINE PREG, ED: Preg Test, Ur: NEGATIVE

## 2021-08-06 MED ORDER — OXYCODONE-ACETAMINOPHEN 5-325 MG PO TABS
1.0000 | ORAL_TABLET | Freq: Once | ORAL | Status: AC
  Administered 2021-08-06: 13:00:00 1 via ORAL
  Filled 2021-08-06: qty 1

## 2021-08-06 MED ORDER — POTASSIUM CHLORIDE CRYS ER 20 MEQ PO TBCR
40.0000 meq | EXTENDED_RELEASE_TABLET | Freq: Once | ORAL | Status: AC
  Administered 2021-08-06: 13:00:00 40 meq via ORAL
  Filled 2021-08-06: qty 2

## 2021-08-06 NOTE — ED Provider Notes (Signed)
[facility] EMERGENCY DEPARTMENT Provider Note   CSN: [id number] Arrival date & time: 08/06/21  1696     History Chief Complaint  Patient presents with   Pelvic Pain    Valerie Robinson is a 35 y.o. female.  With past medical history of chronic pelvic pain, ovarian cysts who presents to the emergency department with pelvic pain.  Patient states that yesterday she began having constant, sharp, shooting bilateral pelvic pain.  She states that she has tried Tylenol, ibuprofen without relief of symptoms.  She states that she is also used heating pads and hot baths without relief of symptoms.  She denies any fevers, nausea, vomiting, diarrhea, vaginal bleeding or discharge, urinary symptoms or concern for STDs.  She states her last menstrual period was the first week of December.    Pelvic Pain Pertinent negatives include no abdominal pain.      Past Medical History:  Diagnosis Date   Anxiety    Bradycardia    pt says was seen at [facility] for bradycardia but was never put on any medication for it.   Chronic back pain    Chronic pelvic pain in female    Drug-seeking behavior 2015   Ovarian cyst    Renal disorder    kidney stone    There are no problems to display for this patient.   Past Surgical History:  Procedure Laterality Date   APPENDECTOMY     CESAREAN SECTION     x2   CHOLECYSTECTOMY N/A 03/03/2013   Procedure: LAPAROSCOPIC CHOLECYSTECTOMY;  Surgeon: Dalia Heading, MD;  Location: [facility];  Service: General;  Laterality: N/A;   CHOLECYSTECTOMY  2014     OB History     Gravida  4   Para  3   Term  3   Preterm      AB      Living  3      SAB      IAB      Ectopic      Multiple      Live Births              Family History  Family history unknown: Yes    Social History   Tobacco Use   Smoking status: Every Day    Packs/day: 0.50    Years: 3.00    Pack years: 1.50    Types: Cigarettes   Smokeless tobacco: Never  Vaping Use    Vaping Use: Former   Substances: Nicotine, Flavoring  Substance Use Topics   Alcohol use: No   Drug use: No    Home Medications Prior to Admission medications   Medication Sig Start Date End Date Taking? Authorizing Provider  acetaminophen (TYLENOL) 500 MG tablet Take 1,000 mg by mouth every 4 (four) hours as needed for moderate pain.     [provider]  albuterol (VENTOLIN HFA) 108 (90 Base) MCG/ACT inhaler Inhale into the lungs. 04/13/21 04/13/22  [provider]  cyclobenzaprine (FLEXERIL) 10 MG tablet Take 1 tablet (10 mg total) by mouth 3 (three) times daily as needed for muscle spasms. 06/11/21   Bethann Berkshire, MD  HYDROcodone-acetaminophen (NORCO/VICODIN) 5-325 MG tablet Take 1 tablet by mouth every 6 (six) hours as needed for moderate pain. 06/11/21   Bethann Berkshire, MD  ondansetron (ZOFRAN-ODT) 4 MG disintegrating tablet Take 1 tablet (4 mg total) by mouth every 8 (eight) hours as needed for nausea or vomiting. 07/21/21   Particia Nearing, PA-C  predniSONE (DELTASONE) 10 MG tablet Take 2 tablets (20 mg total) by mouth daily. 06/11/21   Milton Ferguson, MD  PROAIR HFA 108 [phone] Base) MCG/ACT inhaler SMARTSIG:2 Puff(s) By Mouth Every 4 Hours PRN 04/13/21   [provider]  SUMAtriptan (IMITREX) 50 MG tablet Take 1 tab at onset of headache May repeat in 2 hours if headache persists or recurs. Max of 2 tabs daily 07/21/21   Volney American, PA-C    Allergies    Gabapentin, Naprosyn [naproxen], and Tramadol  Review of Systems   Review of Systems  Constitutional:  Negative for fever.  Gastrointestinal:  Negative for abdominal pain, diarrhea, nausea and vomiting.  Genitourinary:  Positive for pelvic pain. Negative for difficulty urinating, dysuria, hematuria, vaginal bleeding, vaginal discharge and vaginal pain.  All other systems reviewed and are negative.  Physical Exam Updated Vital Signs BP 98/65 (BP Location: Right Arm)    Pulse 63    Temp 98  F (36.7 C) (Oral)    Resp 19    Ht 5\' 6"  (1.676 m)    Wt 97.5 kg    SpO2 99%    BMI 34.70 kg/m   Physical Exam Vitals and nursing note reviewed.  Constitutional:      General: She is not in acute distress.    Appearance: Normal appearance. She is not toxic-appearing.  HENT:     Head: Normocephalic and atraumatic.     Mouth/Throat:     Mouth: Mucous membranes are moist.     Pharynx: Oropharynx is clear.  Eyes:     General: No scleral icterus.    Extraocular Movements: Extraocular movements intact.     Pupils: Pupils are equal, round, and reactive to light.  Cardiovascular:     Rate and Rhythm: Normal rate and regular rhythm.     Pulses: Normal pulses.  Pulmonary:     Effort: Pulmonary effort is normal. No respiratory distress.     Breath sounds: Normal breath sounds.  Abdominal:     General: Abdomen is protuberant. Bowel sounds are normal. There is no distension.     Palpations: Abdomen is soft.     Tenderness: There is abdominal tenderness. There is no rebound.     Hernia: No hernia is present.    Musculoskeletal:        General: Normal range of motion.     Cervical back: Normal range of motion.  Skin:    General: Skin is warm and dry.     Capillary Refill: Capillary refill takes less than 2 seconds.  Neurological:     General: No focal deficit present.     Mental Status: She is alert and oriented to person, place, and time. Mental status is at baseline.  Psychiatric:        Mood and Affect: Mood normal.        Behavior: Behavior normal.        Thought Content: Thought content normal.        Judgment: Judgment normal.    ED Results / Procedures / Treatments   Labs (all labs ordered are listed, but only abnormal results are displayed) Labs Reviewed  COMPREHENSIVE METABOLIC PANEL - Abnormal; Notable for the following components:      Result Value   Potassium 3.3 (*)    BUN 5 (*)    Calcium 8.7 (*)    AST 12 (*)    All other components within normal limits  CBC  WITH DIFFERENTIAL/PLATELET - Abnormal; Notable for  the following components:   MCV 100.3 (*)    MCH 35.0 (*)    All other components within normal limits  URINALYSIS, ROUTINE W REFLEX MICROSCOPIC - Abnormal; Notable for the following components:   Color, Urine COLORLESS (*)    Specific Gravity, Urine 1.002 (*)    All other components within normal limits  POC URINE PREG, ED  I-STAT BETA HCG BLOOD, ED ([facility], [facility], [facility] ONLY)   EKG None  Radiology US PELVIC COMPLETE W TRANSVAGINAL AND TORSION R/O  Result Date: 08/06/2021 CLINICAL DATA:  Pelvic pain for 2 days EXAM: TRANSABDOMINAL AND TRANSVAGINAL ULTRASOUND OF PELVIS DOPPLER ULTRASOUND OF OVARIES TECHNIQUE: Both transabdominal and transvaginal ultrasound examinations of the pelvis were performed. Transabdominal technique was performed for global imaging of the pelvis including uterus, ovaries, adnexal regions, and pelvic cul-de-sac. It was necessary to proceed with endovaginal exam following the transabdominal exam to visualize the uterus, ovaries, and adnexa. Color and duplex Doppler ultrasound was utilized to evaluate blood flow to the ovaries. COMPARISON:  05/23/2018 FINDINGS: Uterus Measurements: 8.6 x 4.0 x 5.5 cm = volume: 88.4 mL. No fibroids or other mass visualized. Endometrium Thickness: Normal, 8 mm.  No focal abnormality visualized. Right ovary Measurements: 4.6 x 4.0 x 5.1 cm = volume: 48.2 mL. Normal appearance/no adnexal mass. Left ovary Measurements: 2.9 x 3.2 x 2.0 cm = volume: 9.9 mL. Normal appearance/no adnexal mass. Pulsed Doppler evaluation of both ovaries demonstrates normal low-resistance arterial and venous waveforms. Other findings No abnormal free fluid. IMPRESSION: No acute process or explanation for pelvic pain. No evidence of ovarian or adnexal torsion. Electronically Signed   By: Abigail Miyamoto M.D.   On: 08/06/2021 14:38    Procedures Procedures   Medications Ordered in ED Medications  oxyCODONE-acetaminophen  (PERCOCET/ROXICET) 5-325 MG per tablet 1 tablet (1 tablet Oral Given 08/06/21 1230)  potassium chloride SA (KLOR-CON M) CR tablet 40 mEq (40 mEq Oral Given 08/06/21 1248)   ED Course  I have reviewed the triage vital signs and the nursing notes.  Pertinent labs & imaging results that were available during my care of the patient were reviewed by me and considered in my medical decision making (see chart for details).    MDM Rules/Calculators/A&P 35 year old female who presents to the emergency department with pelvic pain.  Abdominal exam without peritoneal signs.  There is no evidence of an acute abdomen.  She is well-appearing. Given work-up I have low suspicion for acute hepatobiliary disease including acute cholecystitis, acute pancreatitis.  Lipase negative, LFTs normal.  Doubt peptic ulcer disease, gastric perforation, hepatitis, pyelonephritis, acute appendicitis, bowel obstruction viscus perforation or diverticulitis.  Lab review: Potassium 3.3 --> replaced White blood cells without leukocytosis UA without signs of UTI Not pregnant  Complete pelvic ultrasound performed which shows no acute process, ovarian or adnexal torsion, ovarian cysts, TOA.  Pain likely due to chronic pelvic pain.  She was given a dose of oxycodone here with relief of symptoms.  Instructed follow-up with primary care.  Vital signs are stable and she is safe for discharge at this time.  She verbalized understanding of discharge instructions.  Final Clinical Impression(s) / ED Diagnoses Final diagnoses:  Pelvic pain in female    Rx / DC Orders ED Discharge Orders     None        Mickie Hillier, PA-C 08/06/21 1527    Carmin Muskrat, MD 08/06/21 1544

## 2021-08-06 NOTE — ED Notes (Signed)
Pt walked to the bathroom. 

## 2021-08-06 NOTE — Discharge Instructions (Signed)
You were seen in the emergency department today for pelvic pain.  While you were here we did labs and an ultrasound which were all reassuring.  You may have had an ovarian cyst that ruptured yesterday which is why he started having pelvic pain.  Please return to the emergency department if you have increasing pain.  Continue to use heat over the area and Tylenol and ibuprofen.  Please rest over the next few days.

## 2021-08-06 NOTE — ED Triage Notes (Signed)
Pelvic pain that started 2 days prior. Does have history of ovarian cysts.

## 2021-09-15 ENCOUNTER — Encounter (HOSPITAL_COMMUNITY): Payer: Self-pay | Admitting: *Deleted

## 2021-09-15 ENCOUNTER — Emergency Department (HOSPITAL_COMMUNITY)
Admission: EM | Admit: 2021-09-15 | Discharge: 2021-09-15 | Disposition: A | Discharge status: Home / Self Care | Payer: Medicaid Other | Attending: Emergency Medicine | Admitting: Emergency Medicine

## 2021-09-15 ENCOUNTER — Other Ambulatory Visit: Payer: Self-pay

## 2021-09-15 DIAGNOSIS — K649 Unspecified hemorrhoids: Secondary | ICD-10-CM | POA: Diagnosis present

## 2021-09-15 DIAGNOSIS — K640 First degree hemorrhoids: Secondary | ICD-10-CM

## 2021-09-15 MED ORDER — HYDROCORTISONE (PERIANAL) 2.5 % EX CREA: 1.0000 "application " | TOPICAL_CREAM | Freq: Two times a day (BID) | CUTANEOUS | 0 refills | Status: DC

## 2021-09-15 MED ORDER — ACETAMINOPHEN 325 MG PO TABS
650.0000 mg | ORAL_TABLET | Freq: Once | ORAL | Status: AC
  Administered 2021-09-15: 650 mg via ORAL
  Filled 2021-09-15: qty 2

## 2021-09-15 NOTE — ED Notes (Signed)
Rectal membrane intact, small amount of inflammation present. No active bleeding noted.Valerie Robinson

## 2021-09-15 NOTE — ED Triage Notes (Signed)
Pt c/o painful and bleeding hemorrhoid x 3 days. Has used OTC medication with no relief.

## 2021-09-15 NOTE — Discharge Instructions (Addendum)
Return if any problems.

## 2021-09-15 NOTE — ED Provider Notes (Signed)
[facility] EMERGENCY DEPARTMENT Provider Note   CSN: [id number] Arrival date & time: 09/15/21  [id number]     History  Chief Complaint  Patient presents with   Hemorrhoids    Valerie Robinson is a 36 y.o. female.  Pt complains of a hemorrhoid.  Pt reports she had diarrhea yesterday.  Pt reports today she noticed some blood, a hemorrhoid and pain.  Pt reports diarrhea has decreased.  Pt denies nausea or vomiting   The history is provided by the patient. No language interpreter was used.      Home Medications Prior to Admission medications   Medication Sig Start Date End Date Taking? Authorizing Provider  acetaminophen (TYLENOL) 325 MG tablet Take 650 mg by mouth every 6 (six) hours as needed for mild pain.    [provider]  cyclobenzaprine (FLEXERIL) 10 MG tablet Take 1 tablet (10 mg total) by mouth 3 (three) times daily as needed for muscle spasms. Patient not taking: Reported on 08/06/2021 06/11/21   Milton Ferguson, MD  HYDROcodone-acetaminophen (NORCO/VICODIN) 5-325 MG tablet Take 1 tablet by mouth every 6 (six) hours as needed for moderate pain. Patient not taking: Reported on 08/06/2021 06/11/21   Milton Ferguson, MD  ibuprofen (ADVIL) 200 MG tablet Take 200 mg by mouth every 6 (six) hours as needed for headache, mild pain or fever.    [provider]  ondansetron (ZOFRAN-ODT) 4 MG disintegrating tablet Take 1 tablet (4 mg total) by mouth every 8 (eight) hours as needed for nausea or vomiting. Patient not taking: Reported on 08/06/2021 07/21/21   Volney American, PA-C  predniSONE (DELTASONE) 10 MG tablet Take 2 tablets (20 mg total) by mouth daily. Patient not taking: Reported on 08/06/2021 06/11/21   Milton Ferguson, MD  SUMAtriptan (IMITREX) 50 MG tablet Take 1 tab at onset of headache May repeat in 2 hours if headache persists or recurs. Max of 2 tabs daily Patient not taking: Reported on 08/06/2021 07/21/21   Volney American, PA-C       Allergies    Gabapentin, Naprosyn [naproxen], and Tramadol    Review of Systems   Review of Systems  All other systems reviewed and are negative.  Physical Exam Updated Vital Signs BP 107/61 (BP Location: Right Arm)    Pulse (!) 55    Temp 97.7 F (36.5 C) (Oral)    Resp 16    Ht 5\' 6"  (1.676 m)    Wt 99.8 kg    LMP  (Within Weeks)    SpO2 100%    BMI 35.51 kg/m  Physical Exam Vitals reviewed.  Constitutional:      Appearance: Normal appearance.  Cardiovascular:     Rate and Rhythm: Normal rate.  Pulmonary:     Effort: Pulmonary effort is normal.  Abdominal:     General: Abdomen is flat.  Genitourinary:    Comments: Mall pea sized hemorrhoid. Rectal irritation  Skin:    General: Skin is warm.  Neurological:     General: No focal deficit present.     Mental Status: She is alert.  Psychiatric:        Mood and Affect: Mood normal.    ED Results / Procedures / Treatments   Labs (all labs ordered are listed, but only abnormal results are displayed) Labs Reviewed - No data to display  EKG None  Radiology No results found.  Procedures Procedures    Medications Ordered in ED Medications - No data to display  ED  Course/ Medical Decision Making/ A&P                           Medical Decision Making Pt has a small hemorrhoid.  Pt given rx for anusol.    Risk OTC drugs. Prescription drug management.           Final Clinical Impression(s) / ED Diagnoses Final diagnoses:  Grade I hemorrhoids    Rx / DC Orders ED Discharge Orders     None     An After Visit Summary was printed and given to the patient.     Sidney Ace 09/15/21 1119    Milton Ferguson, MD 09/17/21 [phone]

## 2021-10-31 ENCOUNTER — Other Ambulatory Visit: Payer: Self-pay

## 2021-10-31 ENCOUNTER — Emergency Department (HOSPITAL_COMMUNITY)
Admission: EM | Admit: 2021-10-31 | Discharge: 2021-10-31 | Disposition: A | Discharge status: Home / Self Care | Payer: Medicaid Other | Attending: Emergency Medicine | Admitting: Emergency Medicine

## 2021-10-31 ENCOUNTER — Emergency Department (HOSPITAL_COMMUNITY): Payer: Medicaid Other

## 2021-10-31 ENCOUNTER — Encounter (HOSPITAL_COMMUNITY): Payer: Self-pay | Admitting: *Deleted

## 2021-10-31 DIAGNOSIS — M25512 Pain in left shoulder: Secondary | ICD-10-CM | POA: Insufficient documentation

## 2021-10-31 MED ORDER — HYDROCODONE-ACETAMINOPHEN 5-325 MG PO TABS
1.0000 | ORAL_TABLET | Freq: Once | ORAL | Status: AC
  Administered 2021-10-31: 1 via ORAL
  Filled 2021-10-31: qty 1

## 2021-10-31 MED ORDER — CYCLOBENZAPRINE HCL 10 MG PO TABS: 10.0000 mg | ORAL_TABLET | Freq: Three times a day (TID) | ORAL | 0 refills | Status: DC | PRN

## 2021-10-31 MED ORDER — ETODOLAC 300 MG PO CAPS: 300.0000 mg | ORAL_CAPSULE | Freq: Three times a day (TID) | ORAL | 0 refills | Status: DC

## 2021-10-31 NOTE — Discharge Instructions (Signed)
Take the medications as prescribed for pain.  Try also taking over-the-counter topical pain patches such as Salonpas.  Follow-up with an orthopedic doctor for further evaluation. ?

## 2021-10-31 NOTE — ED Triage Notes (Signed)
Pt c/o left shoulder pain x 3 weeks, worsening this morning. Denies injury. Pt has used Tylenol, Ibuprofen, ice and heat with no relief.  ?

## 2021-10-31 NOTE — ED Triage Notes (Signed)
Np answer when called for triage ?

## 2021-10-31 NOTE — ED Provider Notes (Signed)
?[facility] EMERGENCY DEPARTMENT ?Provider Note ? ? ?CSN: [id number] ?Arrival date & time: 10/31/21  1253 ? ?  ? ?History ? ?Chief Complaint  ?Patient presents with  ? Shoulder Pain  ? ? ?[name] is a 36 y.o. female. ? ? ?Shoulder Pain ?Associated symptoms: no fever   ? ?Patient presents to the ER for evaluation of left shoulder pain.  According to medical records patient does have history of chronic pelvic pain, chronic back pain, renal disorder ovarian cyst.  Patient states she has had pain now for several weeks.  The pain is primarily in the left shoulder.  Patient denies any falls.  She denies any fevers or chills.  Patient states she has had aching pain in the left shoulder that is persistent.  This morning when she tried to lift her arm became more severe.  She has tried ibuprofen and Tylenol and other over-the-counter treatments without relief. ? ?Home Medications ?Prior to Admission medications   ?Medication Sig Start Date End Date Taking? Authorizing Provider  ?etodolac (LODINE) 300 MG capsule Take 1 capsule (300 mg total) by mouth every 8 (eight) hours. 10/31/21  Yes Linwood Dibbles, MD  ?acetaminophen (TYLENOL) 325 MG tablet Take 650 mg by mouth every 6 (six) hours as needed for mild pain.    [provider]  ?cyclobenzaprine (FLEXERIL) 10 MG tablet Take 1 tablet (10 mg total) by mouth 3 (three) times daily as needed for muscle spasms. 10/31/21   Linwood Dibbles, MD  ?hydrocortisone (ANUSOL-HC) 2.5 % rectal cream Place 1 application rectally 2 (two) times daily. 09/15/21   Elson Areas, PA-C  ?   ? ?Allergies    ?Gabapentin, Naprosyn [naproxen], and Tramadol   ? ?Review of Systems   ?Review of Systems  ?Constitutional:  Negative for fever.  ?Neurological:  Negative for headaches.  ? ?Physical Exam ?Updated Vital Signs ?BP 92/76   Pulse 77   Temp (!) 97.5 ?F (36.4 ?C) (Oral)   Resp 18   Ht 1.676 m (5\' 6" )   Wt 91.6 kg   LMP 10/22/2021 (Approximate)   SpO2 100%   BMI 32.60 kg/m?  ?Physical  Exam ?Vitals and nursing note reviewed.  ?Constitutional:   ?   General: She is not in acute distress. ?   Appearance: She is well-developed.  ?HENT:  ?   Head: Normocephalic and atraumatic.  ?   Right Ear: External ear normal.  ?   Left Ear: External ear normal.  ?Eyes:  ?   General: No scleral icterus.    ?   Right eye: No discharge.     ?   Left eye: No discharge.  ?   Conjunctiva/sclera: Conjunctivae normal.  ?Neck:  ?   Trachea: No tracheal deviation.  ?Cardiovascular:  ?   Rate and Rhythm: Normal rate.  ?Pulmonary:  ?   Effort: Pulmonary effort is normal. No respiratory distress.  ?   Breath sounds: No stridor.  ?Abdominal:  ?   General: There is no distension.  ?Musculoskeletal:     ?   General: No swelling or deformity.  ?   Left shoulder: Tenderness and bony tenderness present. No swelling or deformity. Decreased range of motion. Normal pulse.  ?   Cervical back: Neck supple.  ?   Comments: Pain with lifting left arm, no tenderness palpation elbow or wrist, no tenderness palpation of the neck paraspinal region  ?Skin: ?   General: Skin is warm and dry.  ?   Findings:  No rash.  ?Neurological:  ?   Mental Status: She is alert.  ?   Cranial Nerves: Cranial nerve deficit: no gross deficits.  ? ? ?ED Results / Procedures / Treatments   ?Labs ?(all labs ordered are listed, but only abnormal results are displayed) ?Labs Reviewed - No data to display ? ?EKG ?None ? ?Radiology ?DG Shoulder Left ? ?Result Date: 10/31/2021 ?CLINICAL DATA:  LEFT shoulder pain EXAM: LEFT SHOULDER - 2+ VIEW COMPARISON:  None. FINDINGS: Glenohumeral joint is intact. No evidence of scapular fracture or humeral fracture. The acromioclavicular joint is intact. IMPRESSION: No acute osseous abnormality. Electronically Signed   By: Genevive Bi M.D.   On: 10/31/2021 15:43   ? ?Procedures ?Procedures  ? ? ?Medications Ordered in ED ?Medications  ?HYDROcodone-acetaminophen (NORCO/VICODIN) 5-325 MG per tablet 1 tablet (has no administration in  time range)  ? ? ?ED Course/ Medical Decision Making/ A&P ?Clinical Course as of 10/31/21 1619  ?Fri Oct 31, 2021  ?1557 DG Shoulder Left ?Shoulder x-ray images and radiology report reviewed.  No acute findings noted [JK]  ?  ?Clinical Course User Index ?[JK] Linwood Dibbles, MD  ? ?                        ?Medical Decision Making ?Amount and/or Complexity of Data Reviewed ?Radiology: ordered. Decision-making details documented in ED Course. ? ?Risk ?Prescription drug management. ? ?Patient presented to the ED for evaluation of shoulder pain.  Patient has had symptoms for several weeks.  No known trauma or falls.  No fevers or chills.  Exam without signs of acute vascular compromise.  No findings to suggest infection.  X-rays do not show any acute findings.  Symptoms may be related to rotator cuff syndrome.  We will try outpatient medications for pain and discussed outpatient follow-up with orthopedics. ? ? ? ? ? ? ? ?Final Clinical Impression(s) / ED Diagnoses ?Final diagnoses:  ?Acute pain of left shoulder  ? ? ?Rx / DC Orders ?ED Discharge Orders   ? ?      Ordered  ?  cyclobenzaprine (FLEXERIL) 10 MG tablet  3 times daily PRN       ? 10/31/21 1617  ?  etodolac (LODINE) 300 MG capsule  Every 8 hours       ?Note to Pharmacy: As needed for pain  ? 10/31/21 1617  ? ?  ?  ? ?  ? ? ?  ?Linwood Dibbles, MD ?10/31/21 1619 ? ?

## 2021-11-07 ENCOUNTER — Ambulatory Visit: Payer: Medicaid Other | Admitting: Orthopedic Surgery

## 2021-11-07 ENCOUNTER — Encounter: Payer: Self-pay | Admitting: Orthopedic Surgery

## 2021-12-09 ENCOUNTER — Emergency Department (HOSPITAL_COMMUNITY)
Admission: EM | Admit: 2021-12-09 | Discharge: 2021-12-09 | Discharge status: Against Medical Advice | Payer: Medicaid Other | Source: Home / Self Care

## 2021-12-30 ENCOUNTER — Ambulatory Visit
Admission: EM | Admit: 2021-12-30 | Discharge: 2021-12-30 | Disposition: A | Discharge status: Home / Self Care | Payer: Medicaid Other

## 2022-01-02 ENCOUNTER — Ambulatory Visit
Admission: EM | Admit: 2022-01-02 | Discharge: 2022-01-02 | Disposition: A | Discharge status: Home / Self Care | Payer: Medicaid Other | Attending: Emergency Medicine | Admitting: Emergency Medicine

## 2022-01-02 ENCOUNTER — Encounter: Payer: Self-pay | Admitting: Emergency Medicine

## 2022-01-02 ENCOUNTER — Other Ambulatory Visit: Payer: Self-pay

## 2022-01-02 DIAGNOSIS — G43809 Other migraine, not intractable, without status migrainosus: Secondary | ICD-10-CM | POA: Diagnosis not present

## 2022-01-02 DIAGNOSIS — Z20822 Contact with and (suspected) exposure to covid-19: Secondary | ICD-10-CM

## 2022-01-02 MED ORDER — ONDANSETRON 8 MG PO TBDP: ORAL_TABLET | ORAL | 0 refills | Status: DC

## 2022-01-02 MED ORDER — BUTALBITAL-APAP-CAFFEINE 50-325-40 MG PO TABS: 1.0000 | ORAL_TABLET | ORAL | 0 refills | Status: DC | PRN

## 2022-01-02 MED ORDER — DEXAMETHASONE SODIUM PHOSPHATE 10 MG/ML IJ SOLN
10.0000 mg | Freq: Once | INTRAMUSCULAR | Status: AC
  Administered 2022-01-02: 10 mg via INTRAMUSCULAR

## 2022-01-02 MED ORDER — IBUPROFEN 600 MG PO TABS: 600.0000 mg | ORAL_TABLET | Freq: Four times a day (QID) | ORAL | 0 refills | Status: DC | PRN

## 2022-01-02 MED ORDER — KETOROLAC TROMETHAMINE 30 MG/ML IJ SOLN
30.0000 mg | Freq: Once | INTRAMUSCULAR | Status: AC
  Administered 2022-01-02: 30 mg via INTRAMUSCULAR

## 2022-01-02 MED ORDER — ONDANSETRON 8 MG PO TBDP
8.0000 mg | ORAL_TABLET | Freq: Once | ORAL | Status: AC
  Administered 2022-01-02: 8 mg via ORAL

## 2022-01-02 NOTE — ED Provider Notes (Signed)
?HPI ? ?SUBJECTIVE: ? ?Valerie Robinson is a 36 y.o. female who reports a gradual onset throbbing frontal headache located primarily behind her right eye accompanied with nausea, vomiting, generalized weakness and decreased appetite for the past 5 days.  States that she is unable to tolerate any p.o. except liquids.  It is intermittent, lasts hours. Has tried NSAIDS, tylenol, triptan, w/o relief.  States that she is pushing electrolyte containing fluids.  Headache is better with rest and being in a dark room. Worse with light, noise.  No fevers,  visual changes, purulent nasal d/c, nasal congestion, sinus pain/pressure, ear pain, jaw pain, dental pain,  dysarthria, focal weakness, facial droop, discoordination. No body aches, neck stiffness, rash. No seizures, syncope.  Pt is not pregnant. No sudden onset, did not occur during exertion. States is similar to previous migraines. Past medical history negative for aneurysm, , stroke, atrial fibrillation or temporal arteritis.  Of note, her boyfriend, who was exposed to [place], is here for evaluation today. ? ?Patient was seen here November 22 with a headache, thought that viral illness versus migraine, was given Decadron here and sent home with Imitrex, Zofran ? ?Past Medical History:  ?Diagnosis Date  ? Anxiety   ? Bradycardia   ? pt says was seen at [facility] for bradycardia but was never put on any medication for it.  ? Chronic back pain   ? Chronic pelvic pain in female   ? Drug-seeking behavior 2015  ? Ovarian cyst   ? Renal disorder   ? kidney stone  ? ? ?Past Surgical History:  ?Procedure Laterality Date  ? APPENDECTOMY    ? CESAREAN SECTION    ? x2  ? CHOLECYSTECTOMY N/A 03/03/2013  ? Procedure: LAPAROSCOPIC CHOLECYSTECTOMY;  Surgeon: Jamesetta So, MD;  Location: [facility];  Service: General;  Laterality: N/A;  ? CHOLECYSTECTOMY  2014  ? ? ?Family History  ?Family history unknown: Yes  ? ? ?Social History  ? ?Tobacco Use  ? Smoking status: Every Day  ?  Packs/day: 0.50   ?  Years: 3.00  ?  Pack years: 1.50  ?  Types: Cigarettes  ? Smokeless tobacco: Never  ?Vaping Use  ? Vaping Use: Former  ? Substances: Nicotine, Flavoring  ?Substance Use Topics  ? Alcohol use: No  ? Drug use: No  ? ? ?No current facility-administered medications for this encounter. ? ?Current Outpatient Medications:  ?  butalbital-acetaminophen-caffeine (FIORICET) 50-325-40 MG tablet, Take 1-2 tablets by mouth every 4 (four) hours as needed for headache. Max 6 caps/day, Disp: 20 tablet, Rfl: 0 ?  ibuprofen (ADVIL) 600 MG tablet, Take 1 tablet (600 mg total) by mouth every 6 (six) hours as needed., Disp: 30 tablet, Rfl: 0 ?  ondansetron (ZOFRAN-ODT) 8 MG disintegrating tablet, 1/2- 1 tablet q 8 hr prn nausea, vomiting, Disp: 20 tablet, Rfl: 0 ?  hydrocortisone (ANUSOL-HC) 2.5 % rectal cream, Place 1 application rectally 2 (two) times daily., Disp: 30 g, Rfl: 0 ? ?Allergies  ?Allergen Reactions  ? Gabapentin Rash  ? Naprosyn [Naproxen] Hives, Nausea And Vomiting and Rash  ? Tramadol Rash  ? ? ? ?ROS ? ?As noted in HPI.  ? ?Physical Exam ? ?BP 117/84 (BP Location: Right Arm)   Pulse 69   Temp 98.8 ?F (37.1 ?C) (Oral)   Resp 18   Ht 5\' 6"  (1.676 m)   Wt 99.8 kg   LMP 12/13/2021 (Approximate)   SpO2 98%   BMI 35.51 kg/m?  ? ?Constitutional:  Well developed, well nourished, no acute distress ?Eyes: PERRL, EOMI, conjunctiva normal bilaterally.  Mild photophobia ?HENT: Normocephalic, atraumatic,mucus membranes moist, normal lower dentition.  Upper dentition absent.  TM normal b/l. No TMJ tenderness. . No nasal congestion, no sinus tenderness. No temporal artery tenderness.  ?Neck: no cervical LN.  No trapezial muscle tenderness. No meningismus ?Respiratory: normal inspiratory effort, lungs clear bilaterally ?Cardiovascular: Normal rate, regular rhythm ?GI:  nondistended ?skin: No rash, skin intact ?Musculoskeletal: No edema, no tenderness, no deformities ?Neurologic: Alert & oriented x 3, CN III-XII intact,  romberg neg, finger-> nose, heel-> shin equal b/l, Romberg neg, tandem gait steady ?Psychiatric: Speech and behavior appropriate ? ? ?ED Course ? ? ?Medications  ?ketorolac (TORADOL) 30 MG/ML injection 30 mg (30 mg Intramuscular Given 01/02/22 1652)  ?ondansetron (ZOFRAN-ODT) disintegrating tablet 8 mg (8 mg Oral Given 01/02/22 1652)  ?dexamethasone (DECADRON) injection 10 mg (10 mg Intramuscular Given 01/02/22 1652)  ? ? ?Orders Placed This Encounter  ?Procedures  ? Coronavirus (COVID-19) with Influenza A and Influenza B  ?  Standing Status:   Standing  ?  Number of Occurrences:   1  ? Nursing Communication Please set up with PCP prior to discharge  ?  Please set up with PCP prior to discharge  ?  Standing Status:   Standing  ?  Number of Occurrences:   1  ? ?No results found for this or any previous visit (from the past 24 hour(s)). ?No results found. ? ? ?ED Clinical Impression ? ?1. Other migraine without status migrainosus, not intractable   ?2. Exposure to COVID-19 virus   ? ? ?ED Assessment/Plan ? ?Previous records reviewed.  As noted in HPI ? ?Pt describing typical pain, no sudden onset. Doubt SAH, ICH or space occupying lesion. Pt without fevers/chills, Pt has no meningeal sx, no nuchal rigidity. Doubt meningitis. Pt with normal neuro exam, no evidence of CVA/TIA.  Pt BP not elevated significantly, doubt hypertensive emergency. No evidence of temporal artery tenderness, no evidence of glaucoma or other ocular pathology. Will give headache cocktail (dexamethasone 10 IM, Zofran 8 ODT, Toradol 30 IM). ? ?[place] controlled substances registry for this patient consulted and feel the risk/benefit ratio today is favorable for proceeding with this prescription for a controlled substance.  Last opiate prescription 06/11/2021 ? ?COVID, influenza sent.  Unfortunately, she will be out of the window for treatment by the time these are resulted.  She does not have any comorbidities, so is not at high risk for  progression to severe illness.  In the meantime, Tylenol/ibuprofen, or ibuprofen/Fioricet, Zofran.  Continue pushing electrolyte containing fluids.  Will set up follow-up with the PCP prior to discharge.  She will need a neurology referral from her PCP as she gets about 7 headaches a month.  Discussed labs, MDM, plan for follow up, signs and sx that should prompt return to ER. Pt agrees with plan ? ?Meds ordered this encounter  ?Medications  ? ketorolac (TORADOL) 30 MG/ML injection 30 mg  ? ondansetron (ZOFRAN-ODT) disintegrating tablet 8 mg  ? dexamethasone (DECADRON) injection 10 mg  ? ibuprofen (ADVIL) 600 MG tablet  ?  Sig: Take 1 tablet (600 mg total) by mouth every 6 (six) hours as needed.  ?  Dispense:  30 tablet  ?  Refill:  0  ? butalbital-acetaminophen-caffeine (FIORICET) 50-325-40 MG tablet  ?  Sig: Take 1-2 tablets by mouth every 4 (four) hours as needed for headache. Max 6 caps/day  ?  Dispense:  20 tablet  ?  Refill:  0  ? ondansetron (ZOFRAN-ODT) 8 MG disintegrating tablet  ?  Sig: 1/2- 1 tablet q 8 hr prn nausea, vomiting  ?  Dispense:  20 tablet  ?  Refill:  0  ? ? ?*This clinic note was created using [employer]. Therefore, there may be occasional mistakes despite careful proofreading. ? ??  ?  ?Melynda Ripple, MD ?01/02/22 1655 ? ?

## 2022-01-02 NOTE — ED Triage Notes (Addendum)
Pt reports headache, emesis, generalized body aches since Sunday.Pt spouse has similar symptoms and had covid exposure at work earlier this week. ? ?pt reports right sided headache with light sensitivity and reports took migraine medication from last UC visit and reports no change in pain.  ?

## 2022-01-02 NOTE — Discharge Instructions (Addendum)
Take 600 mg of ibuprofen, 1000 mg of Tylenol together 3-4 times a day as needed.  Or you can take ibuprofen and Fioricet.  Do not take Tylenol if you are taking Fioricet.  Zofran will help with nausea, vomiting and headache.  Continue pushing electrolyte containing fluids such as Pedialyte, liquid IV or Gatorade.  We will contact you if your COVID or flu come back positive. ?

## 2022-01-04 LAB — COVID-19, FLU A+B NAA

## 2022-01-09 ENCOUNTER — Ambulatory Visit: Payer: Medicaid Other | Admitting: Family Medicine

## 2022-01-30 ENCOUNTER — Encounter (HOSPITAL_COMMUNITY): Payer: Self-pay | Admitting: *Deleted

## 2022-01-30 ENCOUNTER — Emergency Department (HOSPITAL_COMMUNITY)
Admission: EM | Admit: 2022-01-30 | Discharge: 2022-01-30 | Disposition: A | Discharge status: Home / Self Care | Payer: Medicaid Other | Attending: Emergency Medicine | Admitting: Emergency Medicine

## 2022-01-30 ENCOUNTER — Other Ambulatory Visit: Payer: Self-pay

## 2022-01-30 ENCOUNTER — Emergency Department (HOSPITAL_COMMUNITY): Payer: Medicaid Other

## 2022-01-30 DIAGNOSIS — N83201 Unspecified ovarian cyst, right side: Secondary | ICD-10-CM | POA: Insufficient documentation

## 2022-01-30 DIAGNOSIS — R1031 Right lower quadrant pain: Secondary | ICD-10-CM | POA: Diagnosis present

## 2022-01-30 LAB — CBC WITH DIFFERENTIAL/PLATELET
Abs Immature Granulocytes: 0.01 10*3/uL (ref 0.00–0.07)
Basophils Absolute: 0 10*3/uL (ref 0.0–0.1)
Basophils Relative: 1 %
Eosinophils Absolute: 0.2 10*3/uL (ref 0.0–0.5)
Eosinophils Relative: 2 %
HCT: 42.4 % (ref 36.0–46.0)
Hemoglobin: 14.6 g/dL (ref 12.0–15.0)
Immature Granulocytes: 0 %
Lymphocytes Relative: 28 %
Lymphs Abs: 2.5 10*3/uL (ref 0.7–4.0)
MCH: 33.6 pg (ref 26.0–34.0)
MCHC: 34.4 g/dL (ref 30.0–36.0)
MCV: 97.5 fL (ref 80.0–100.0)
Monocytes Absolute: 0.6 10*3/uL (ref 0.1–1.0)
Monocytes Relative: 6 %
Neutro Abs: 5.6 10*3/uL (ref 1.7–7.7)
Neutrophils Relative %: 63 %
Platelets: 265 10*3/uL (ref 150–400)
RBC: 4.35 MIL/uL (ref 3.87–5.11)
RDW: 12.2 % (ref 11.5–15.5)
WBC: 8.9 10*3/uL (ref 4.0–10.5)
nRBC: 0 % (ref 0.0–0.2)

## 2022-01-30 LAB — URINALYSIS, ROUTINE W REFLEX MICROSCOPIC
Bacteria, UA: NONE SEEN
Bilirubin Urine: NEGATIVE
Glucose, UA: NEGATIVE mg/dL
Hgb urine dipstick: NEGATIVE
Ketones, ur: NEGATIVE mg/dL
Nitrite: NEGATIVE
Protein, ur: NEGATIVE mg/dL
Specific Gravity, Urine: 1.013 (ref 1.005–1.030)
pH: 6 (ref 5.0–8.0)

## 2022-01-30 LAB — COMPREHENSIVE METABOLIC PANEL
ALT: 24 U/L (ref 0–44)
AST: 21 U/L (ref 15–41)
Albumin: 4.2 g/dL (ref 3.5–5.0)
Alkaline Phosphatase: 76 U/L (ref 38–126)
Anion gap: 5 (ref 5–15)
BUN: 7 mg/dL (ref 6–20)
CO2: 26 mmol/L (ref 22–32)
Calcium: 9 mg/dL (ref 8.9–10.3)
Chloride: 104 mmol/L (ref 98–111)
Creatinine, Ser: 0.73 mg/dL (ref 0.44–1.00)
GFR, Estimated: >60 mL/min (ref >60)
Glucose, Bld: 97 mg/dL (ref 70–99)
Potassium: 3.5 mmol/L (ref 3.5–5.1)
Sodium: 135 mmol/L (ref 135–145)
Total Bilirubin: 0.9 mg/dL (ref 0.3–1.2)
Total Protein: 7.3 g/dL (ref 6.5–8.1)

## 2022-01-30 LAB — PREGNANCY, URINE: Preg Test, Ur: NEGATIVE

## 2022-01-30 MED ORDER — IBUPROFEN 800 MG PO TABS: 800.0000 mg | ORAL_TABLET | Freq: Three times a day (TID) | ORAL | 0 refills | Status: DC | PRN

## 2022-01-30 MED ORDER — HYDROCODONE-ACETAMINOPHEN 5-325 MG PO TABS
2.0000 | ORAL_TABLET | Freq: Once | ORAL | Status: AC
  Administered 2022-01-30: 2 via ORAL
  Filled 2022-01-30: qty 2

## 2022-01-30 NOTE — ED Provider Notes (Signed)
[facility] EMERGENCY DEPARTMENT Provider Note   CSN: [id number] Arrival date & time: 01/30/22  8937     History  Chief Complaint  Patient presents with   Abdominal Pain    Valerie Robinson is a 36 y.o. female.  Pt complains of right sided lower abdominal pain.  Pt reports she has a history of ovarain cyst.  She reports this pain is similar to the pain that she has had with ovarian cyst.  Patient denies any nausea vomiting or diarrhea patient denies any fever or chills she has not had any burning with urination patient denies any STD risk  The history is provided by the patient. No language interpreter was used.  Abdominal Pain Pain location:  RLQ Pain quality: aching   Pain radiates to:  Does not radiate Pain severity:  Moderate Onset quality:  Gradual Timing:  Constant Progression:  Worsening Chronicity:  New Relieved by:  Nothing Worsened by:  Nothing Ineffective treatments:  None tried Risk factors: no alcohol abuse        Home Medications Prior to Admission medications   Medication Sig Start Date End Date Taking? Authorizing Provider  butalbital-acetaminophen-caffeine (FIORICET) 50-325-40 MG tablet Take 1-2 tablets by mouth every 4 (four) hours as needed for headache. Max 6 caps/day 01/02/22   Domenick Gong, MD  hydrocortisone (ANUSOL-HC) 2.5 % rectal cream Place 1 application rectally 2 (two) times daily. 09/15/21   Elson Areas, PA-C  ibuprofen (ADVIL) 600 MG tablet Take 1 tablet (600 mg total) by mouth every 6 (six) hours as needed. 01/02/22   Domenick Gong, MD  ondansetron (ZOFRAN-ODT) 8 MG disintegrating tablet 1/2- 1 tablet q 8 hr prn nausea, vomiting 01/02/22   Domenick Gong, MD      Allergies    Gabapentin, Naprosyn [naproxen], and Tramadol    Review of Systems   Review of Systems  Gastrointestinal:  Positive for abdominal pain.  All other systems reviewed and are negative.   Physical Exam Updated Vital Signs Ht 5\' 6"  (1.676 m)   Wt 95.3  kg   LMP 01/11/2022 (Approximate)   BMI 33.89 kg/m  Physical Exam Vitals and nursing note reviewed.  Constitutional:      Appearance: She is well-developed.  HENT:     Head: Normocephalic.  Cardiovascular:     Rate and Rhythm: Normal rate.     Heart sounds: Normal heart sounds.  Pulmonary:     Effort: Pulmonary effort is normal.  Abdominal:     General: Bowel sounds are normal. There is no distension.     Tenderness: There is abdominal tenderness.  Musculoskeletal:        General: Normal range of motion.     Cervical back: Normal range of motion.  Skin:    General: Skin is warm.  Neurological:     Mental Status: She is alert and oriented to person, place, and time.     ED Results / Procedures / Treatments   Labs (all labs ordered are listed, but only abnormal results are displayed) Labs Reviewed - No data to display  EKG None  Radiology 01/13/2022 PELVIC COMPLETE W TRANSVAGINAL AND TORSION R/O  Result Date: 01/30/2022 CLINICAL DATA:  Pain right lower quadrant EXAM: TRANSABDOMINAL AND TRANSVAGINAL ULTRASOUND OF PELVIS DOPPLER ULTRASOUND OF OVARIES TECHNIQUE: Both transabdominal and transvaginal ultrasound examinations of the pelvis were performed. Transabdominal technique was performed for global imaging of the pelvis including uterus, ovaries, adnexal regions, and pelvic cul-de-sac. It was necessary to proceed with endovaginal  exam following the transabdominal exam to visualize the endometrium and ovaries. Color and duplex Doppler ultrasound was utilized to evaluate blood flow to the ovaries. COMPARISON:  08/06/2021 FINDINGS: Uterus Measurements: 8 x 3.9 x 5.6 cm = volume: 92.4 mL. No fibroids or other mass visualized. Endometrium Thickness: 5 mm.  No focal abnormality visualized. Right ovary Measurements: 3.6 x 4.2 x 3.3 cm = volume: 25.95 mL. There are few cysts largest measuring 2.5 cm. There are no internal septations or mural nodules. Findings suggest possible follicles or  functional cysts. Left ovary Measurements: 1.9 x 2.5 x 1.6 cm = volume: 4.04 mL. Normal appearance/no adnexal mass. Pulsed Doppler evaluation of both ovaries demonstrates normal low-resistance arterial and venous waveforms. Other findings No abnormal free fluid. IMPRESSION: There is no evidence of ovarian torsion. There is 2.5 cm anechoic cyst in the right adnexa suggesting dominant follicle or functional cyst in the right ovary. Uterus and left ovary are unremarkable. Electronically Signed   By: Ernie Avena M.D.   On: 01/30/2022 11:22    Procedures Procedures    Medications Ordered in ED Medications - No data to display  ED Course/ Medical Decision Making/ A&P                           Medical Decision Making Patient complains of lower abdominal pain.  Amount and/or Complexity of Data Reviewed External Data Reviewed: labs and notes. Labs: ordered. Radiology: ordered and independent interpretation performed. Decision-making details documented in ED Course.    Details: Ultrasound shows right ovarian cyst.  patient has normal blood flow bilaterally  Risk Prescription drug management.  Patient is given a prescription for ibuprofen.  Patient is advised to follow-up with her gynecologist for recheck return to the emergency department if any problems         Final Clinical Impression(s) / ED Diagnoses Final diagnoses:  Ovarian cyst, right    Rx / DC Orders ED Discharge Orders          Ordered    ibuprofen (ADVIL) 800 MG tablet  Every 8 hours PRN        01/30/22 1234           An After Visit Summary was printed and given to the patient.    Osie Cheeks 01/30/22 1752    Cheryll Cockayne, MD 02/09/22 [phone]

## 2022-01-30 NOTE — Discharge Instructions (Addendum)
Follow up with your gynecologist if any problems.

## 2022-01-30 NOTE — ED Triage Notes (Signed)
Pt c/o RLQ abdominal pain with n/v since last night. Denies fever.

## 2022-04-01 ENCOUNTER — Encounter (HOSPITAL_COMMUNITY): Payer: Self-pay

## 2022-04-01 ENCOUNTER — Emergency Department (HOSPITAL_COMMUNITY)
Admission: EM | Admit: 2022-04-01 | Discharge: 2022-04-02 | Disposition: A | Discharge status: Home / Self Care | Payer: Medicaid Other | Attending: Emergency Medicine | Admitting: Emergency Medicine

## 2022-04-01 ENCOUNTER — Other Ambulatory Visit: Payer: Self-pay

## 2022-04-01 DIAGNOSIS — W19XXXA Unspecified fall, initial encounter: Secondary | ICD-10-CM | POA: Insufficient documentation

## 2022-04-01 DIAGNOSIS — S8002XA Contusion of left knee, initial encounter: Secondary | ICD-10-CM | POA: Insufficient documentation

## 2022-04-01 DIAGNOSIS — S8001XA Contusion of right knee, initial encounter: Secondary | ICD-10-CM | POA: Insufficient documentation

## 2022-04-01 DIAGNOSIS — M25562 Pain in left knee: Secondary | ICD-10-CM

## 2022-04-01 DIAGNOSIS — S8991XA Unspecified injury of right lower leg, initial encounter: Secondary | ICD-10-CM | POA: Diagnosis present

## 2022-04-01 NOTE — ED Triage Notes (Signed)
Pt presents with right and left knee/shin injury after falling outside today. Bruising and skin rash noted to area but no deformity noted. Pt ambulatory to triage. States that the right hurts more than the left.

## 2022-04-02 ENCOUNTER — Emergency Department (HOSPITAL_COMMUNITY): Payer: Medicaid Other

## 2022-04-02 MED ORDER — ACETAMINOPHEN 500 MG PO TABS
1000.0000 mg | ORAL_TABLET | Freq: Once | ORAL | Status: AC
Start: 2022-04-02 — End: 2022-04-02
  Administered 2022-04-02: 1000 mg via ORAL
  Filled 2022-04-02: qty 2

## 2022-04-02 NOTE — ED Provider Notes (Signed)
[facility] EMERGENCY DEPARTMENT Provider Note   CSN: [id number] Arrival date & time: 04/01/22  2023     History  Chief Complaint  Patient presents with   Knee Injury    Valerie Robinson is a 36 y.o. female.  36 year old female that tripped over a stool in her house and fell on her knees causing bruising to both knees.  She states that her.  She is able to ambulate but is concerned for fracture so presents here.  No other injuries from the fall she states that she does have little bit of right lower back pain but thinks it might be related just twisted it when she fell did not take any medications prior to coming.        Home Medications Prior to Admission medications   Medication Sig Start Date End Date Taking? Authorizing Provider  butalbital-acetaminophen-caffeine (FIORICET) 50-325-40 MG tablet Take 1-2 tablets by mouth every 4 (four) hours as needed for headache. Max 6 caps/day 01/02/22   Domenick Gong, MD  ibuprofen (ADVIL) 800 MG tablet Take 1 tablet (800 mg total) by mouth every 8 (eight) hours as needed. 01/30/22   Elson Areas, PA-C  ondansetron (ZOFRAN-ODT) 8 MG disintegrating tablet 1/2- 1 tablet q 8 hr prn nausea, vomiting Patient taking differently: Take 4-8 mg by mouth every 8 (eight) hours as needed for nausea or vomiting. 01/02/22   Domenick Gong, MD      Allergies    Gabapentin, Naprosyn [naproxen], and Tramadol    Review of Systems   Review of Systems  Physical Exam Updated Vital Signs BP 132/79   Pulse 62   Temp 98.3 F (36.8 C) (Oral)   Resp 18   Ht 5\' 6"  (1.676 m)   Wt 95 kg   SpO2 92%   BMI 33.80 kg/m  Physical Exam Vitals and nursing note reviewed.  Constitutional:      Appearance: She is well-developed.  HENT:     Head: Normocephalic and atraumatic.     Mouth/Throat:     Mouth: Mucous membranes are moist.     Pharynx: Oropharynx is clear.  Eyes:     Pupils: Pupils are equal, round, and reactive to light.  Cardiovascular:      Rate and Rhythm: Normal rate and regular rhythm.  Pulmonary:     Effort: No respiratory distress.     Breath sounds: No stridor.  Abdominal:     General: Abdomen is flat. There is no distension.  Musculoskeletal:        General: Tenderness (and ecchymosis to bilateral knees) present.     Cervical back: Normal range of motion.  Skin:    General: Skin is warm and dry.  Neurological:     General: No focal deficit present.     Mental Status: She is alert.     ED Results / Procedures / Treatments   Labs (all labs ordered are listed, but only abnormal results are displayed) Labs Reviewed - No data to display  EKG None  Radiology DG Knee Complete 4 Views Right  Result Date: 04/02/2022 CLINICAL DATA:  Bilateral knee pain after fall today. EXAM: RIGHT KNEE - COMPLETE 4+ VIEW COMPARISON:  None Available. FINDINGS: No evidence of fracture, dislocation, or joint effusion. No evidence of arthropathy or other focal bone abnormality. Soft tissues are unremarkable. IMPRESSION: Negative radiographs of the right knee. Electronically Signed   By: 06/02/2022 M.D.   On: 04/02/2022 02:08   DG Knee Complete 4 Views  Left  Result Date: 04/02/2022 CLINICAL DATA:  Bilateral knee pain after fall today. EXAM: LEFT KNEE - COMPLETE 4+ VIEW COMPARISON:  None Available. FINDINGS: No evidence of fracture, dislocation, or joint effusion. No evidence of arthropathy or other focal bone abnormality. Soft tissues are unremarkable. IMPRESSION: Negative radiographs of the left knee. Electronically Signed   By: Narda Rutherford M.D.   On: 04/02/2022 02:07    Procedures Procedures    Medications Ordered in ED Medications  acetaminophen (TYLENOL) tablet 1,000 mg (1,000 mg Oral Given 04/02/22 0153)    ED Course/ Medical Decision Making/ A&P                           Medical Decision Making Amount and/or Complexity of Data Reviewed Radiology: ordered.  Risk OTC drugs.   No fractures. Likely soft tissue  injuries, will treat conservatively at home.   Final Clinical Impression(s) / ED Diagnoses Final diagnoses:  Acute pain of both knees    Rx / DC Orders ED Discharge Orders     None         Jassiah Viviano, Barbara Cower, MD 04/02/22 [phone]

## 2022-07-29 DIAGNOSIS — F1124 Opioid dependence with opioid-induced mood disorder: Secondary | ICD-10-CM | POA: Diagnosis not present

## 2022-07-29 DIAGNOSIS — Z5181 Encounter for therapeutic drug level monitoring: Secondary | ICD-10-CM | POA: Diagnosis not present

## 2022-07-29 DIAGNOSIS — Z79899 Other long term (current) drug therapy: Secondary | ICD-10-CM | POA: Diagnosis not present

## 2022-08-03 DIAGNOSIS — Z79899 Other long term (current) drug therapy: Secondary | ICD-10-CM | POA: Diagnosis not present

## 2022-08-03 DIAGNOSIS — F1124 Opioid dependence with opioid-induced mood disorder: Secondary | ICD-10-CM | POA: Diagnosis not present

## 2022-08-03 DIAGNOSIS — Z5181 Encounter for therapeutic drug level monitoring: Secondary | ICD-10-CM | POA: Diagnosis not present

## 2022-08-25 DIAGNOSIS — Z5181 Encounter for therapeutic drug level monitoring: Secondary | ICD-10-CM | POA: Diagnosis not present

## 2022-08-25 DIAGNOSIS — F1124 Opioid dependence with opioid-induced mood disorder: Secondary | ICD-10-CM | POA: Diagnosis not present

## 2022-08-25 DIAGNOSIS — F1320 Sedative, hypnotic or anxiolytic dependence, uncomplicated: Secondary | ICD-10-CM | POA: Diagnosis not present

## 2022-08-25 DIAGNOSIS — Z79899 Other long term (current) drug therapy: Secondary | ICD-10-CM | POA: Diagnosis not present

## 2022-08-28 ENCOUNTER — Ambulatory Visit
Admission: EM | Admit: 2022-08-28 | Discharge: 2022-08-28 | Disposition: A | Discharge status: Home / Self Care | Payer: Medicaid Other | Attending: Nurse Practitioner | Admitting: Nurse Practitioner

## 2022-08-28 DIAGNOSIS — Z1152 Encounter for screening for COVID-19: Secondary | ICD-10-CM | POA: Diagnosis not present

## 2022-08-28 DIAGNOSIS — R6889 Other general symptoms and signs: Secondary | ICD-10-CM | POA: Diagnosis not present

## 2022-08-28 MED ORDER — OSELTAMIVIR PHOSPHATE 75 MG PO CAPS: 75.0000 mg | ORAL_CAPSULE | Freq: Two times a day (BID) | ORAL | 0 refills | Status: DC

## 2022-08-28 MED ORDER — ONDANSETRON 4 MG PO TBDP: 4.0000 mg | ORAL_TABLET | Freq: Three times a day (TID) | ORAL | 0 refills | Status: DC | PRN

## 2022-08-28 NOTE — ED Provider Notes (Signed)
[facility]    CSN: [id number] Arrival date & time: 08/28/22  1310      History   Chief Complaint Chief Complaint  Patient presents with   Emesis   Generalized Body Aches         HPI Valerie Robinson is a 37 y.o. female.   The history is provided by the patient.   The patient presents with a 48-hour history of bodyaches, chills, headache, and vomiting.  Patient also reports subjective fever.  She denies sore throat, cough, abdominal pain, or diarrhea.  Patient reports that she has vomited more than 10 times since her symptoms started.  She reports that she is able to eat and drink at times.  She states that she has had several family members that have been ill recently.  Past Medical History:  Diagnosis Date   Anxiety    Bradycardia    pt says was seen at [facility] for bradycardia but was never put on any medication for it.   Chronic back pain    Chronic pelvic pain in female    Drug-seeking behavior 2015   Ovarian cyst    Renal disorder    kidney stone    There are no problems to display for this patient.   Past Surgical History:  Procedure Laterality Date   APPENDECTOMY     CESAREAN SECTION     x2   CHOLECYSTECTOMY N/A 03/03/2013   Procedure: LAPAROSCOPIC CHOLECYSTECTOMY;  Surgeon: Jamesetta So, MD;  Location: [facility];  Service: General;  Laterality: N/A;   CHOLECYSTECTOMY  2014    OB History     Gravida  4   Para  3   Term  3   Preterm      AB      Living  3      SAB      IAB      Ectopic      Multiple      Live Births               Home Medications    Prior to Admission medications   Medication Sig Start Date End Date Taking? Authorizing Provider  ondansetron (ZOFRAN-ODT) 4 MG disintegrating tablet Take 1 tablet (4 mg total) by mouth every 8 (eight) hours as needed for nausea or vomiting. 08/28/22  Yes Ariona Deschene-Warren, Alda Lea, NP  oseltamivir (TAMIFLU) 75 MG capsule Take 1 capsule (75 mg total) by mouth every 12 (twelve)  hours. 08/28/22  Yes Keani Gotcher-Warren, Alda Lea, NP  butalbital-acetaminophen-caffeine (FIORICET) 50-325-40 MG tablet Take 1-2 tablets by mouth every 4 (four) hours as needed for headache. Max 6 caps/day 01/02/22   Melynda Ripple, MD  ibuprofen (ADVIL) 800 MG tablet Take 1 tablet (800 mg total) by mouth every 8 (eight) hours as needed. 01/30/22   Fransico Meadow, PA-C    Family History Family History  Family history unknown: Yes    Social History Social History   Tobacco Use   Smoking status: Every Day    Packs/day: 0.50    Years: 3.00    Total pack years: 1.50    Types: Cigarettes   Smokeless tobacco: Never  Vaping Use   Vaping Use: Former   Substances: Nicotine, Flavoring  Substance Use Topics   Alcohol use: No   Drug use: No     Allergies   Gabapentin, Naprosyn [naproxen], and Tramadol   Review of Systems Review of Systems Per HPI  Physical Exam Triage Vital Signs  ED Triage Vitals  Enc Vitals Group     BP 08/28/22 1345 105/71     Pulse Rate 08/28/22 1345 (!) 59     Resp 08/28/22 1345 18     Temp 08/28/22 1345 97.9 F (36.6 C)     Temp Source 08/28/22 1345 Oral     SpO2 08/28/22 1345 99 %     Weight --      Height --      Head Circumference --      Peak Flow --      Pain Score 08/28/22 1348 9     Pain Loc --      Pain Edu? --      Excl. in [place]? --    No data found.  Updated Vital Signs BP 105/71 (BP Location: Right Arm)   Pulse (!) 59   Temp 97.9 F (36.6 C) (Oral)   Resp 18   LMP 08/25/2022 (Exact Date)   SpO2 99%   Visual Acuity Right Eye Distance:   Left Eye Distance:   Bilateral Distance:    Right Eye Near:   Left Eye Near:    Bilateral Near:     Physical Exam Vitals and nursing note reviewed.  Constitutional:      General: She is not in acute distress.    Appearance: Normal appearance. She is well-developed.  HENT:     Head: Normocephalic.     Right Ear: Tympanic membrane, ear canal and external ear normal.     Left Ear: Tympanic  membrane, ear canal and external ear normal.     Nose: Nose normal.     Right Turbinates: Enlarged and swollen.     Left Turbinates: Enlarged and swollen.     Right Sinus: No maxillary sinus tenderness or frontal sinus tenderness.     Left Sinus: No maxillary sinus tenderness or frontal sinus tenderness.     Mouth/Throat:     Lips: Pink.     Mouth: Mucous membranes are moist.     Pharynx: Oropharynx is clear. Uvula midline. No pharyngeal swelling or posterior oropharyngeal erythema.  Eyes:     Extraocular Movements: Extraocular movements intact.     Conjunctiva/sclera: Conjunctivae normal.     Pupils: Pupils are equal, round, and reactive to light.  Cardiovascular:     Rate and Rhythm: Regular rhythm.     Heart sounds: Normal heart sounds.  Pulmonary:     Effort: Pulmonary effort is normal. No respiratory distress.     Breath sounds: Normal breath sounds. No stridor. No wheezing, rhonchi or rales.  Abdominal:     General: Bowel sounds are normal. There is no distension.     Palpations: Abdomen is soft.     Tenderness: There is no abdominal tenderness. There is no guarding or rebound.  Genitourinary:    Vagina: Normal. No vaginal discharge.  Musculoskeletal:     Cervical back: Normal range of motion.  Lymphadenopathy:     Cervical: No cervical adenopathy.  Skin:    General: Skin is warm and dry.     Findings: No erythema or rash.  Neurological:     General: No focal deficit present.     Mental Status: She is alert and oriented to person, place, and time.     Cranial Nerves: No cranial nerve deficit.  Psychiatric:        Mood and Affect: Mood normal.        Behavior: Behavior normal.  Thought Content: Thought content normal.        Judgment: Judgment normal.      UC Treatments / Results  Labs (all labs ordered are listed, but only abnormal results are displayed) Labs Reviewed  SARS CORONAVIRUS 2 (TAT 6-24 HRS)    EKG   Radiology No results  found.  Procedures Procedures (including critical care time)  Medications Ordered in UC Medications - No data to display  Initial Impression / Assessment and Plan / UC Course  I have reviewed the triage vital signs and the nursing notes.  Pertinent labs & imaging results that were available during my care of the patient were reviewed by me and considered in my medical decision making (see chart for details).  The patient is well-appearing, she is in no acute distress, vital signs are stable.  Suspect a viral illness, most likely influenza that may be contributing to the patient's symptoms.  COVID test is pending.  Patient is a candidate to receive Paxlovid if her COVID test is positive.  For her nausea and vomiting, patient was started on Zofran.  Will treat patient for influenza with Tamiflu 75mg .  Supportive care recommendations were provided to the patient to include increasing fluids, allowing for plenty of rest, and recommending a brat diet.  Discussed viral etiology with the patient and when follow-up may be necessary.  Patient verbalizes understanding.  All questions were answered.  Patient stable for discharge.  Final Clinical Impressions(s) / UC Diagnoses   Final diagnoses:  Encounter for screening for COVID-19  Flu-like symptoms     Discharge Instructions      COVID test is pending.  You will be contacted if the pending test results are positive.  If you choose, you are a candidate to receive Paxlovid as antiviral therapy. Take medication as prescribed. Increase fluids and allow for plenty of rest. Recommend a brat diet until nausea and vomiting improved.  This includes bananas, rice, applesauce, and toast. Continue use of Tylenol and ibuprofen as needed for pain, fever, or general discomfort. As discussed, a viral illness may last for 7 to 14 days.  If your symptoms suddenly worsen, or extend beyond that timeframe, please follow-up in this clinic or with your primary care  physician for further evaluation. Follow-up as needed.     ED Prescriptions     Medication Sig Dispense Auth. Provider   oseltamivir (TAMIFLU) 75 MG capsule Take 1 capsule (75 mg total) by mouth every 12 (twelve) hours. 10 capsule Jamale Spangler-Warren, , NP   ondansetron (ZOFRAN-ODT) 4 MG disintegrating tablet Take 1 tablet (4 mg total) by mouth every 8 (eight) hours as needed for nausea or vomiting. 20 tablet Omari Koslosky-Warren, Sadie Haber, NP      PDMP not reviewed this encounter.   Sadie Haber, NP 08/28/22 1438

## 2022-08-28 NOTE — Discharge Instructions (Addendum)
COVID test is pending.  You will be contacted if the pending test results are positive.  If you choose, you are a candidate to receive Paxlovid as antiviral therapy. Take medication as prescribed. Increase fluids and allow for plenty of rest. Recommend a brat diet until nausea and vomiting improved.  This includes bananas, rice, applesauce, and toast. Continue use of Tylenol and ibuprofen as needed for pain, fever, or general discomfort. As discussed, a viral illness may last for 7 to 14 days.  If your symptoms suddenly worsen, or extend beyond that timeframe, please follow-up in this clinic or with your primary care physician for further evaluation. Follow-up as needed.

## 2022-08-28 NOTE — ED Triage Notes (Signed)
Pt reports vomiting, headache, and body aches x 2 days.

## 2022-08-29 LAB — SARS CORONAVIRUS 2 (TAT 6-24 HRS): SARS Coronavirus 2: NEGATIVE

## 2022-08-31 DIAGNOSIS — Z5181 Encounter for therapeutic drug level monitoring: Secondary | ICD-10-CM | POA: Diagnosis not present

## 2022-08-31 DIAGNOSIS — Z79899 Other long term (current) drug therapy: Secondary | ICD-10-CM | POA: Diagnosis not present

## 2022-08-31 DIAGNOSIS — F1124 Opioid dependence with opioid-induced mood disorder: Secondary | ICD-10-CM | POA: Diagnosis not present

## 2022-08-31 DIAGNOSIS — F1320 Sedative, hypnotic or anxiolytic dependence, uncomplicated: Secondary | ICD-10-CM | POA: Diagnosis not present

## 2022-10-22 ENCOUNTER — Encounter: Payer: Self-pay | Admitting: Radiology

## 2022-11-02 ENCOUNTER — Ambulatory Visit
Admission: EM | Admit: 2022-11-02 | Discharge: 2022-11-02 | Disposition: A | Discharge status: Home / Self Care | Payer: Medicaid Other | Attending: Family Medicine | Admitting: Family Medicine

## 2022-11-02 DIAGNOSIS — K529 Noninfective gastroenteritis and colitis, unspecified: Secondary | ICD-10-CM

## 2022-11-02 LAB — POCT URINALYSIS DIP (MANUAL ENTRY)
Bilirubin, UA: NEGATIVE
Blood, UA: NEGATIVE
Glucose, UA: NEGATIVE mg/dL
Ketones, POC UA: NEGATIVE mg/dL
Leukocytes, UA: NEGATIVE
Nitrite, UA: NEGATIVE
Protein Ur, POC: NEGATIVE mg/dL
Spec Grav, UA: >=1.03 — AB (ref 1.010–1.025)
Urobilinogen, UA: 0.2 E.U./dL
pH, UA: 5.5 (ref 5.0–8.0)

## 2022-11-02 LAB — POCT URINE PREGNANCY: Preg Test, Ur: NEGATIVE

## 2022-11-02 MED ORDER — ONDANSETRON 4 MG PO TBDP
4.0000 mg | ORAL_TABLET | Freq: Once | ORAL | Status: AC
  Administered 2022-11-02: 4 mg via ORAL

## 2022-11-02 MED ORDER — ONDANSETRON 4 MG PO TBDP: 4.0000 mg | ORAL_TABLET | Freq: Three times a day (TID) | ORAL | 0 refills | Status: DC | PRN

## 2022-11-02 NOTE — ED Triage Notes (Signed)
Pt reports diarrhea, vomiting and headache x 2 days. Tylenol gives no relief.

## 2022-11-02 NOTE — Discharge Instructions (Addendum)

## 2022-11-04 NOTE — ED Provider Notes (Signed)
[facility]   NG:[id number] 11/02/22 Arrival Time: 1618  ASSESSMENT & PLAN:  1. Gastroenteritis    No signs of dehydration requiring IVF.  Meds ordered this encounter  Medications   ondansetron (ZOFRAN-ODT) disintegrating tablet 4 mg   ondansetron (ZOFRAN-ODT) 4 MG disintegrating tablet    Sig: Take 1 tablet (4 mg total) by mouth every 8 (eight) hours as needed for nausea or vomiting.    Dispense:  15 tablet    Refill:  0   Benign abdomen. Discussed typical duration of symptoms for suspected viral GI illness. Will do her best to ensure adequate fluid intake in order to avoid dehydration. Will proceed to the Emergency Department for evaluation if unable to tolerate PO fluids regularly.  Otherwise she will f/u with her PCP or here if not showing improvement over the next 48-72 hours.  Reviewed: Results for orders placed or performed during the hospital encounter of 11/02/22  POCT urinalysis dipstick  Result Value Ref Range   Color, UA yellow yellow   Clarity, UA hazy (A) clear   Glucose, UA negative negative mg/dL   Bilirubin, UA negative negative   Ketones, POC UA negative negative mg/dL   Spec Grav, UA >=1.030 (A) 1.010 - 1.025   Blood, UA negative negative   pH, UA 5.5 5.0 - 8.0   Protein Ur, POC negative negative mg/dL   Urobilinogen, UA 0.2 0.2 or 1.0 E.U./dL   Nitrite, UA Negative Negative   Leukocytes, UA Negative Negative  POCT urine pregnancy  Result Value Ref Range   Preg Test, Ur Negative Negative    Reviewed expectations re: course of current medical issues. Questions answered. Outlined signs and symptoms indicating need for more acute intervention. Patient verbalized understanding. After Visit Summary given.   SUBJECTIVE: History from: patient.  Valerie Robinson is a 37 y.o. female who presents with complaint of non-bilious, non-bloody intermittent n/v with non-bloody diarrhea. Onset  gradual; 2 d ago; family member with same . Abdominal  discomfort: mild and cramping. Symptoms are stable since beginning. Aggravating factors: eating. Alleviating factors: none. Associated symptoms: fatigue and headache. She denies fever. Appetite: decreased. PO intake: decreased. Ambulatory without assistance. Urinary symptoms: none.  Recent travel or camping: none. Tylenol does relieve HA.  Patient's last menstrual period was 10/25/2022 (exact date).  Past Surgical History:  Procedure Laterality Date   APPENDECTOMY     CESAREAN SECTION     x2   CHOLECYSTECTOMY N/A 03/03/2013   Procedure: LAPAROSCOPIC CHOLECYSTECTOMY;  Surgeon: Jamesetta So, MD;  Location: [facility];  Service: General;  Laterality: N/A;   CHOLECYSTECTOMY  2014    OBJECTIVE:  Vitals:   11/02/22 1717  BP: 94/63  Pulse: (!) 56  Resp: 18  Temp: 98 F (36.7 C)  TempSrc: Oral  SpO2: 98%    General appearance: alert; no distress Oropharynx: moist Lungs: clear to auscultation bilaterally; unlabored Heart: regular rate and rhythm Abdomen: soft; non-distended; no significant abdominal tenderness; reports "cramping" feeling; bowel sounds present; no masses or organomegaly; no guarding or rebound tenderness Back: no CVA tenderness Extremities: no edema; symmetrical with no gross deformities Skin: warm; dry Neurologic: normal gait Psychological: alert and cooperative; normal mood and affect  Labs: Results for orders placed or performed during the hospital encounter of 11/02/22  POCT urinalysis dipstick  Result Value Ref Range   Color, UA yellow yellow   Clarity, UA hazy (A) clear   Glucose, UA negative negative mg/dL   Bilirubin, UA negative negative  Ketones, POC UA negative negative mg/dL   Spec Grav, UA >=1.030 (A) 1.010 - 1.025   Blood, UA negative negative   pH, UA 5.5 5.0 - 8.0   Protein Ur, POC negative negative mg/dL   Urobilinogen, UA 0.2 0.2 or 1.0 E.U./dL   Nitrite, UA Negative Negative   Leukocytes, UA Negative Negative  POCT urine pregnancy   Result Value Ref Range   Preg Test, Ur Negative Negative   Labs Reviewed  POCT URINALYSIS DIP (MANUAL ENTRY) - Abnormal; Notable for the following components:      Result Value   Clarity, UA hazy (*)    Spec Grav, UA >=1.030 (*)    All other components within normal limits  POCT URINE PREGNANCY    Imaging: No results found.  Allergies  Allergen Reactions   Gabapentin Rash   Naprosyn [Naproxen] Hives, Nausea And Vomiting and Rash   Tramadol Rash                                               Past Medical History:  Diagnosis Date   Anxiety    Bradycardia    pt says was seen at [facility] for bradycardia but was never put on any medication for it.   Chronic back pain    Chronic pelvic pain in female    Drug-seeking behavior 2015   Ovarian cyst    Renal disorder    kidney stone   Social History   Socioeconomic History   Marital status: Single    Spouse name: Not on file   Number of children: Not on file   Years of education: Not on file   Highest education level: Not on file  Occupational History   Not on file  Tobacco Use   Smoking status: Every Day    Packs/day: 0.50    Years: 3.00    Total pack years: 1.50    Types: Cigarettes   Smokeless tobacco: Never  Vaping Use   Vaping Use: Former   Substances: Nicotine, Flavoring  Substance and Sexual Activity   Alcohol use: No   Drug use: No   Sexual activity: Yes    Birth control/protection: None  Other Topics Concern   Not on file  Social History Narrative   Not on file   Social Determinants of Health   Financial Resource Strain: Not on file  Food Insecurity: Not on file  Transportation Needs: Not on file  Physical Activity: Not on file  Stress: Not on file  Social Connections: Not on file  Intimate Partner Violence: Not on file   Family History  Family history unknown: Yes      Vanessa Kick, MD 11/04/22 1047

## 2023-06-02 DIAGNOSIS — F1120 Opioid dependence, uncomplicated: Secondary | ICD-10-CM | POA: Diagnosis not present

## 2023-06-29 ENCOUNTER — Ambulatory Visit
Admission: EM | Admit: 2023-06-29 | Discharge: 2023-06-29 | Disposition: A | Discharge status: Home / Self Care | Payer: 59 | Attending: Nurse Practitioner | Admitting: Nurse Practitioner

## 2023-06-29 ENCOUNTER — Encounter: Payer: Self-pay | Admitting: Emergency Medicine

## 2023-06-29 DIAGNOSIS — R112 Nausea with vomiting, unspecified: Secondary | ICD-10-CM

## 2023-06-29 DIAGNOSIS — R519 Headache, unspecified: Secondary | ICD-10-CM | POA: Diagnosis not present

## 2023-06-29 LAB — POC COVID19/FLU A&B COMBO
Covid Antigen, POC: NEGATIVE
Influenza A Antigen, POC: NEGATIVE
Influenza B Antigen, POC: NEGATIVE

## 2023-06-29 LAB — POCT RAPID STREP A (OFFICE): Rapid Strep A Screen: NEGATIVE

## 2023-06-29 MED ORDER — ONDANSETRON 4 MG PO TBDP: 4.0000 mg | ORAL_TABLET | Freq: Three times a day (TID) | ORAL | 0 refills | Status: DC | PRN

## 2023-06-29 MED ORDER — ONDANSETRON 4 MG PO TBDP
4.0000 mg | ORAL_TABLET | Freq: Once | ORAL | Status: AC
  Administered 2023-06-29: 4 mg via ORAL

## 2023-06-29 NOTE — ED Triage Notes (Signed)
Vomiting yesterday and today.  Body aches and headaches since yesterday.

## 2023-06-29 NOTE — ED Provider Notes (Signed)
[facility]    CSN: [id number] Arrival date & time: 06/29/23  0841      History   Chief Complaint No chief complaint on file.   HPI Valerie Robinson is a 37 y.o. female.   Patient presents today with 1 day history of bodyaches, stuffy nose, headache, nausea, vomiting, decreased appetite, and fatigue.  She reports 5 episodes of vomiting today and she describes the emesis as bile.  No blood in the emesis.  Patient denies known fevers, cough, shortness of breath or chest pain, runny nose, sore throat, abdominal pain, diarrhea, and loss of taste or smell.  Has taken Tylenol for the headache which seem to help.  No known sick contacts, however her daughter is now sick with similar symptoms.  Patient reports that she works at [employer].  She was able to drink soup yesterday and keep it down.    Past Medical History:  Diagnosis Date   Anxiety    Bradycardia    pt says was seen at [facility] for bradycardia but was never put on any medication for it.   Chronic back pain    Chronic pelvic pain in female    Drug-seeking behavior 2015   Ovarian cyst    Renal disorder    kidney stone    There are no problems to display for this patient.   Past Surgical History:  Procedure Laterality Date   APPENDECTOMY     CESAREAN SECTION     x2   CHOLECYSTECTOMY N/A 03/03/2013   Procedure: LAPAROSCOPIC CHOLECYSTECTOMY;  Surgeon: Dalia Heading, MD;  Location: [facility];  Service: General;  Laterality: N/A;   CHOLECYSTECTOMY  2014    OB History     Gravida  4   Para  3   Term  3   Preterm      AB      Living  3      SAB      IAB      Ectopic      Multiple      Live Births               Home Medications    Prior to Admission medications   Medication Sig Start Date End Date Taking? Authorizing Provider  ondansetron (ZOFRAN-ODT) 4 MG disintegrating tablet Take 1 tablet (4 mg total) by mouth every 8 (eight) hours as needed for nausea or vomiting. 06/29/23  Yes  Valentino Nose, NP  acetaminophen (TYLENOL) 500 MG tablet Take 500 mg by mouth every 6 (six) hours as needed.    [provider]    Family History Family History  Family history unknown: Yes    Social History Social History   Tobacco Use   Smoking status: Every Day    Current packs/day: 0.50    Average packs/day: 0.5 packs/day for 3.0 years (1.5 ttl pk-yrs)    Types: Cigarettes   Smokeless tobacco: Never  Vaping Use   Vaping status: Former   Substances: Nicotine, Flavoring  Substance Use Topics   Alcohol use: No   Drug use: No     Allergies   Gabapentin, Naprosyn [naproxen], and Tramadol   Review of Systems Review of Systems Per HPI  Physical Exam Triage Vital Signs ED Triage Vitals  Encounter Vitals Group     BP 06/29/23 0905 (!) 140/72     Systolic BP Percentile --      Diastolic BP Percentile --      Pulse  Rate 06/29/23 0905 62     Resp 06/29/23 0905 18     Temp 06/29/23 0905 98 F (36.7 C)     Temp Source 06/29/23 0905 Oral     SpO2 06/29/23 0905 96 %     Weight --      Height --      Head Circumference --      Peak Flow --      Pain Score 06/29/23 0906 8     Pain Loc --      Pain Education --      Exclude from Growth Chart --    No data found.  Updated Vital Signs BP (!) 140/72 (BP Location: Right Arm)   Pulse 62   Temp 98 F (36.7 C) (Oral)   Resp 18   LMP 06/18/2023 (Exact Date)   SpO2 96%   Visual Acuity Right Eye Distance:   Left Eye Distance:   Bilateral Distance:    Right Eye Near:   Left Eye Near:    Bilateral Near:     Physical Exam Vitals and nursing note reviewed.  Constitutional:      General: She is not in acute distress.    Appearance: Normal appearance. She is not ill-appearing or toxic-appearing.  HENT:     Head: Normocephalic and atraumatic.     Right Ear: Tympanic membrane, ear canal and external ear normal.     Left Ear: Tympanic membrane, ear canal and external ear normal.     Nose: Rhinorrhea  present. No congestion.     Mouth/Throat:     Mouth: Mucous membranes are moist.     Pharynx: Oropharynx is clear. No oropharyngeal exudate or posterior oropharyngeal erythema.  Eyes:     General: No scleral icterus.    Extraocular Movements: Extraocular movements intact.  Cardiovascular:     Rate and Rhythm: Normal rate and regular rhythm.  Pulmonary:     Effort: Pulmonary effort is normal. No respiratory distress.     Breath sounds: Normal breath sounds. No wheezing, rhonchi or rales.  Abdominal:     General: Abdomen is flat. Bowel sounds are normal. There is no distension.     Palpations: Abdomen is soft.     Tenderness: There is no abdominal tenderness. There is no guarding or rebound.  Musculoskeletal:     Cervical back: Normal range of motion and neck supple.  Lymphadenopathy:     Cervical: No cervical adenopathy.  Skin:    General: Skin is warm and dry.     Coloration: Skin is not jaundiced or pale.     Findings: No erythema or rash.  Neurological:     Mental Status: She is alert and oriented to person, place, and time.  Psychiatric:        Behavior: Behavior is cooperative.      UC Treatments / Results  Labs (all labs ordered are listed, but only abnormal results are displayed) Labs Reviewed  POCT RAPID STREP A (OFFICE)  POC COVID19/FLU A&B COMBO    EKG   Radiology No results found.  Procedures Procedures (including critical care time)  Medications Ordered in UC Medications  ondansetron (ZOFRAN-ODT) disintegrating tablet 4 mg (4 mg Oral Given 06/29/23 0945)    Initial Impression / Assessment and Plan / UC Course  I have reviewed the triage vital signs and the nursing notes.  Pertinent labs & imaging results that were available during my care of the patient were reviewed by me and considered in  my medical decision making (see chart for details).   Patient is well-appearing, normotensive, afebrile, not tachycardic, not tachypneic, oxygenating well on  room air.    1. Nausea and vomiting, unspecified vomiting type 2. Acute nonintractable headache, unspecified headache type Overall, vitals and exam are reassuring Abdomen is benign Suspect viral illness Rapid strep negative, COVID-19 and influenza also negative Zofran 4 mg ODT given, patient able to tolerate saltine crackers and water after Supportive care discussed with patient including continue Zofran at home Work excuse provided Return precautions discussed and ER precautions discussed  The patient was given the opportunity to ask questions.  All questions answered to their satisfaction.  The patient is in agreement to this plan.    Final Clinical Impressions(s) / UC Diagnoses   Final diagnoses:  Nausea and vomiting, unspecified vomiting type  Acute nonintractable headache, unspecified headache type     Discharge Instructions      As we discussed, you likely have a viral illness.  Rapid strep throat test, influenza test, and COVID-19 test were all negative today.  We gave you Zofran today which helped with the nausea.  You can continue Zofran at home every 8 hours as needed.  Please seek care in the emergency room if you develop severe abdominal pain or nausea/vomiting and are unable to keep fluids down despite the Zofran.     ED Prescriptions     Medication Sig Dispense Auth. Provider   ondansetron (ZOFRAN-ODT) 4 MG disintegrating tablet Take 1 tablet (4 mg total) by mouth every 8 (eight) hours as needed for nausea or vomiting. 20 tablet Valentino Nose, NP      PDMP not reviewed this encounter.   Valentino Nose, NP 06/29/23 1010

## 2023-06-29 NOTE — Discharge Instructions (Signed)
As we discussed, you likely have a viral illness.  Rapid strep throat test, influenza test, and COVID-19 test were all negative today.  We gave you Zofran today which helped with the nausea.  You can continue Zofran at home every 8 hours as needed.  Please seek care in the emergency room if you develop severe abdominal pain or nausea/vomiting and are unable to keep fluids down despite the Zofran.

## 2023-07-02 ENCOUNTER — Ambulatory Visit
Admission: EM | Admit: 2023-07-02 | Discharge: 2023-07-02 | Disposition: A | Discharge status: Home / Self Care | Payer: 59 | Attending: Nurse Practitioner | Admitting: Nurse Practitioner

## 2023-07-02 DIAGNOSIS — Z8669 Personal history of other diseases of the nervous system and sense organs: Secondary | ICD-10-CM

## 2023-07-02 DIAGNOSIS — R519 Headache, unspecified: Secondary | ICD-10-CM | POA: Diagnosis not present

## 2023-07-02 MED ORDER — ONDANSETRON 4 MG PO TBDP
4.0000 mg | ORAL_TABLET | Freq: Once | ORAL | Status: AC
  Administered 2023-07-02: 4 mg via ORAL

## 2023-07-02 MED ORDER — KETOROLAC TROMETHAMINE 30 MG/ML IJ SOLN
30.0000 mg | Freq: Once | INTRAMUSCULAR | Status: AC
  Administered 2023-07-02: 30 mg via INTRAMUSCULAR

## 2023-07-02 MED ORDER — DEXAMETHASONE SODIUM PHOSPHATE 10 MG/ML IJ SOLN
10.0000 mg | INTRAMUSCULAR | Status: AC
  Administered 2023-07-02: 10 mg via INTRAMUSCULAR

## 2023-07-02 NOTE — ED Triage Notes (Signed)
Pt reports she has a migraine that is going down to her neck and making her nauseas x 3 days

## 2023-07-02 NOTE — Discharge Instructions (Addendum)
You were given injections of Decadron 10 mg, Toradol 30 mg, and Zofran 4 mg by mouth.  Do not take any additional ibuprofen today.  Recommend Tylenol arthritis strength 650 mg tablets as needed for breakthrough pain. Continue Zofran previously prescribed. Increase fluids and allow for plenty of rest. Recommend sleeping in a dimly lit room, and using a cool cloth across her forehead while symptoms persist. Go to the emergency department immediately if you experience worsening headache, weakness in your legs or feet, confusion, or other concerns. Recommend following up with your PCP/urology if you develop reoccurring headaches, with worsening severity. Follow-up as needed.

## 2023-07-02 NOTE — ED Provider Notes (Signed)
[facility]    CSN: [id number] Arrival date & time: 07/02/23  0916      History   Chief Complaint Chief Complaint  Patient presents with   Migraine    HPI Valerie Robinson is a 37 y.o. female.   The history is provided by the patient.   Patient presents for complaints of headache that is been present for the past 3 days.  Patient states headache is located behind both her eyes, and is radiating down her neck.  She complains of lightheadedness, nausea, and mild light sensitivity.  Rates pain 6/10 at present, describes pain as stabbing."  Okay patient reports history of migraines, states her last migraine was approximately 2 to 3 months ago.  She denies fever, chills, nasal congestion, ear pain, jaw pain, weakness, unstable gait, neck stiffness, blurred vision, dizziness, visual changes, vomiting, or diarrhea.  Patient denies aura.  Patient states headache does improve after Tylenol, but returns.  Patient recently seen in this clinic and diagnosed with a viral upper respiratory infection.  Past Medical History:  Diagnosis Date   Anxiety    Bradycardia    pt says was seen at [facility] for bradycardia but was never put on any medication for it.   Chronic back pain    Chronic pelvic pain in female    Drug-seeking behavior 2015   Ovarian cyst    Renal disorder    kidney stone    There are no problems to display for this patient.   Past Surgical History:  Procedure Laterality Date   APPENDECTOMY     CESAREAN SECTION     x2   CHOLECYSTECTOMY N/A 03/03/2013   Procedure: LAPAROSCOPIC CHOLECYSTECTOMY;  Surgeon: Dalia Heading, MD;  Location: [facility];  Service: General;  Laterality: N/A;   CHOLECYSTECTOMY  2014    OB History     Gravida  4   Para  3   Term  3   Preterm      AB      Living  3      SAB      IAB      Ectopic      Multiple      Live Births               Home Medications    Prior to Admission medications   Medication Sig Start  Date End Date Taking? Authorizing Provider  acetaminophen (TYLENOL) 500 MG tablet Take 500 mg by mouth every 6 (six) hours as needed.    [provider]  ondansetron (ZOFRAN-ODT) 4 MG disintegrating tablet Take 1 tablet (4 mg total) by mouth every 8 (eight) hours as needed for nausea or vomiting. 06/29/23   Valentino Nose, NP    Family History Family History  Family history unknown: Yes    Social History Social History   Tobacco Use   Smoking status: Every Day    Current packs/day: 0.50    Average packs/day: 0.5 packs/day for 3.0 years (1.5 ttl pk-yrs)    Types: Cigarettes   Smokeless tobacco: Never  Vaping Use   Vaping status: Former   Substances: Nicotine, Flavoring  Substance Use Topics   Alcohol use: No   Drug use: No     Allergies   Gabapentin, Naprosyn [naproxen], and Tramadol   Review of Systems Review of Systems Per HPI  Physical Exam Triage Vital Signs ED Triage Vitals  Encounter Vitals Group     BP 07/02/23 0958 Marland Kitchen)  121/51     Systolic BP Percentile --      Diastolic BP Percentile --      Pulse Rate 07/02/23 0958 67     Resp 07/02/23 0958 20     Temp 07/02/23 0958 98.8 F (37.1 C)     Temp Source 07/02/23 0958 Oral     SpO2 07/02/23 0958 94 %     Weight --      Height --      Head Circumference --      Peak Flow --      Pain Score 07/02/23 0957 5     Pain Loc --      Pain Education --      Exclude from Growth Chart --    No data found.  Updated Vital Signs BP (!) 121/51 (BP Location: Right Arm)   Pulse 67   Temp 98.8 F (37.1 C) (Oral)   Resp 20   LMP 06/18/2023 (Exact Date)   SpO2 94%   Visual Acuity Right Eye Distance:   Left Eye Distance:   Bilateral Distance:    Right Eye Near:   Left Eye Near:    Bilateral Near:     Physical Exam Vitals and nursing note reviewed.  Constitutional:      General: She is not in acute distress.    Appearance: Normal appearance.  HENT:     Head: Normocephalic.     Right Ear:  Tympanic membrane, ear canal and external ear normal.     Left Ear: Tympanic membrane, ear canal and external ear normal.     Nose: Nose normal.     Mouth/Throat:     Mouth: Mucous membranes are moist.  Eyes:     General: Lids are normal. Vision grossly intact.     Extraocular Movements: Extraocular movements intact.     Right eye: Normal extraocular motion and no nystagmus.     Left eye: Normal extraocular motion and no nystagmus.     Comments: Pinpoint pupils noted on exam  Cardiovascular:     Rate and Rhythm: Normal rate and regular rhythm.     Pulses: Normal pulses.     Heart sounds: Normal heart sounds.  Pulmonary:     Effort: Pulmonary effort is normal. No respiratory distress.     Breath sounds: Normal breath sounds. No stridor. No wheezing, rhonchi or rales.  Abdominal:     General: Bowel sounds are normal.     Palpations: Abdomen is soft.     Tenderness: There is no abdominal tenderness.  Musculoskeletal:     Cervical back: Normal range of motion.  Lymphadenopathy:     Cervical: No cervical adenopathy.  Skin:    General: Skin is warm and dry.  Neurological:     General: No focal deficit present.     Mental Status: She is alert and oriented to person, place, and time.     GCS: GCS eye subscore is 4. GCS verbal subscore is 5. GCS motor subscore is 6.     Cranial Nerves: No cranial nerve deficit.     Sensory: No sensory deficit.     Motor: No weakness.     Coordination: Coordination is intact. Coordination normal.     Gait: Gait is intact. Gait normal.  Psychiatric:        Mood and Affect: Mood normal.        Behavior: Behavior normal.      UC Treatments / Results  Labs (all labs  ordered are listed, but only abnormal results are displayed) Labs Reviewed - No data to display  EKG   Radiology No results found.  Procedures Procedures (including critical care time)  Medications Ordered in UC Medications  ketorolac (TORADOL) 30 MG/ML injection 30 mg (has  no administration in time range)  dexamethasone (DECADRON) injection 10 mg (has no administration in time range)  ondansetron (ZOFRAN-ODT) disintegrating tablet 4 mg (has no administration in time range)    Initial Impression / Assessment and Plan / UC Course  I have reviewed the triage vital signs and the nursing notes.  Pertinent labs & imaging results that were available during my care of the patient were reviewed by me and considered in my medical decision making (see chart for details).  On exam, neurological exam is within normal limits, no deficits noted.  Patient with history of migraine.  No evidence of CVA/TIA.  COVID/flu and rapid strep test performed on 11/5, all were negative.  Toradol 30 mg IM, Decadron 10 mg IM, and Zofran 4 mg ODT administered.  Supportive care recommendations were provided and discussed with the patient to include continuing Tylenol, increasing fluids, and allowing for plenty of rest.  Patient was given strict ER follow-up precautions.  Patient also advised to follow-up with her PCP/neurology for reoccurring or worsening headaches.  Patient was in agreement with this plan of care and verbalizes understanding.  All questions were answered.  Patient stable for discharge.  Work note was provided.   Final Clinical Impressions(s) / UC Diagnoses   Final diagnoses:  Intractable headache, unspecified chronicity pattern, unspecified headache type  History of migraine     Discharge Instructions      You were given injections of Decadron 10 mg, Toradol 30 mg, and Zofran 4 mg by mouth.  Do not take any additional ibuprofen today.  Recommend Tylenol arthritis strength 650 mg tablets as needed for breakthrough pain. Continue Zofran previously prescribed. Increase fluids and allow for plenty of rest. Recommend sleeping in a dimly lit room, and using a cool cloth across her forehead while symptoms persist. Go to the emergency department immediately if you experience  worsening headache, weakness in your legs or feet, confusion, or other concerns. Recommend following up with your PCP/urology if you develop reoccurring headaches, with worsening severity. Follow-up as needed.     ED Prescriptions   None    PDMP not reviewed this encounter.   Abran Cantor, NP 07/02/23 1022

## 2023-07-23 ENCOUNTER — Ambulatory Visit
Admission: EM | Admit: 2023-07-23 | Discharge: 2023-07-23 | Disposition: A | Discharge status: Home / Self Care | Payer: 59 | Attending: Family Medicine | Admitting: Family Medicine

## 2023-07-23 DIAGNOSIS — G43809 Other migraine, not intractable, without status migrainosus: Secondary | ICD-10-CM

## 2023-07-23 MED ORDER — TIZANIDINE HCL 4 MG PO CAPS: 4.0000 mg | ORAL_CAPSULE | Freq: Three times a day (TID) | ORAL | 0 refills | Status: DC | PRN

## 2023-07-23 MED ORDER — DEXAMETHASONE SODIUM PHOSPHATE 10 MG/ML IJ SOLN
10.0000 mg | Freq: Once | INTRAMUSCULAR | Status: AC
  Administered 2023-07-23: 10 mg via INTRAMUSCULAR

## 2023-07-23 MED ORDER — ONDANSETRON 4 MG PO TBDP: 4.0000 mg | ORAL_TABLET | Freq: Three times a day (TID) | ORAL | 0 refills | Status: DC | PRN

## 2023-07-23 NOTE — ED Triage Notes (Signed)
Pt reports she has had a severe headache, neck pain, and vomiting x 3 days   Tylenol and ibuprofen but no relief

## 2023-07-23 NOTE — ED Provider Notes (Signed)
[facility]    CSN: [id number] Arrival date & time: 07/23/23  1652      History   Chief Complaint No chief complaint on file.   HPI Valerie Robinson is a 37 y.o. female.   Patient presenting today with 3-day history of headache behind eyes, nausea, photophobia.  States the pain is now radiating down her neck as well.  Denies head injury, visual change, vomiting, mental status changes, extremity weakness numbness or tingling.  States she has been having migraines more more frequently recently so she has an appointment for further evaluation but they were unable to schedule her before the end of January.  She has been trying over-the-counter pain relievers with minimal relief.    Past Medical History:  Diagnosis Date   Anxiety    Bradycardia    pt says was seen at [facility] for bradycardia but was never put on any medication for it.   Chronic back pain    Chronic pelvic pain in female    Drug-seeking behavior 2015   Ovarian cyst    Renal disorder    kidney stone    There are no problems to display for this patient.   Past Surgical History:  Procedure Laterality Date   APPENDECTOMY     CESAREAN SECTION     x2   CHOLECYSTECTOMY N/A 03/03/2013   Procedure: LAPAROSCOPIC CHOLECYSTECTOMY;  Surgeon: Dalia Heading, MD;  Location: [facility];  Service: General;  Laterality: N/A;   CHOLECYSTECTOMY  2014    OB History     Gravida  4   Para  3   Term  3   Preterm      AB      Living  3      SAB      IAB      Ectopic      Multiple      Live Births               Home Medications    Prior to Admission medications   Medication Sig Start Date End Date Taking? Authorizing Provider  ondansetron (ZOFRAN-ODT) 4 MG disintegrating tablet Take 1 tablet (4 mg total) by mouth every 8 (eight) hours as needed for nausea or vomiting. 07/23/23  Yes Particia Nearing, PA-C  tiZANidine (ZANAFLEX) 4 MG capsule Take 1 capsule (4 mg total) by mouth 3 (three)  times daily as needed for muscle spasms. Do not drink alcohol or drive while taking this medication.  May cause drowsiness. 07/23/23  Yes Particia Nearing, PA-C  acetaminophen (TYLENOL) 500 MG tablet Take 500 mg by mouth every 6 (six) hours as needed.    [provider]  ondansetron (ZOFRAN-ODT) 4 MG disintegrating tablet Take 1 tablet (4 mg total) by mouth every 8 (eight) hours as needed for nausea or vomiting. 06/29/23   Valentino Nose, NP    Family History Family History  Family history unknown: Yes    Social History Social History   Tobacco Use   Smoking status: Every Day    Current packs/day: 0.50    Average packs/day: 0.5 packs/day for 3.0 years (1.5 ttl pk-yrs)    Types: Cigarettes   Smokeless tobacco: Never  Vaping Use   Vaping status: Former   Substances: Nicotine, Flavoring  Substance Use Topics   Alcohol use: No   Drug use: No     Allergies   Gabapentin, Naprosyn [naproxen], and Tramadol   Review of Systems Review of Systems  Per HPI  Physical Exam Triage Vital Signs ED Triage Vitals  Encounter Vitals Group     BP 07/23/23 1716 96/60     Systolic BP Percentile --      Diastolic BP Percentile --      Pulse Rate 07/23/23 1716 (!) 58     Resp 07/23/23 1716 16     Temp 07/23/23 1716 98.1 F (36.7 C)     Temp Source 07/23/23 1716 Oral     SpO2 07/23/23 1716 99 %     Weight --      Height --      Head Circumference --      Peak Flow --      Pain Score 07/23/23 1715 7     Pain Loc --      Pain Education --      Exclude from Growth Chart --    No data found.  Updated Vital Signs BP 96/60 (BP Location: Right Arm)   Pulse (!) 58   Temp 98.1 F (36.7 C) (Oral)   Resp 16   LMP 07/08/2023 (Exact Date)   SpO2 99%   Visual Acuity Right Eye Distance:   Left Eye Distance:   Bilateral Distance:    Right Eye Near:   Left Eye Near:    Bilateral Near:     Physical Exam Vitals and nursing note reviewed.  Constitutional:       Appearance: Normal appearance. She is not ill-appearing.  HENT:     Head: Atraumatic.     Mouth/Throat:     Mouth: Mucous membranes are moist.  Eyes:     Extraocular Movements: Extraocular movements intact.     Conjunctiva/sclera: Conjunctivae normal.     Pupils: Pupils are equal, round, and reactive to light.  Cardiovascular:     Rate and Rhythm: Normal rate and regular rhythm.     Heart sounds: Normal heart sounds.  Pulmonary:     Effort: Pulmonary effort is normal.     Breath sounds: Normal breath sounds.  Musculoskeletal:        General: Normal range of motion.     Cervical back: Normal range of motion and neck supple.  Skin:    General: Skin is warm and dry.  Neurological:     General: No focal deficit present.     Mental Status: She is alert and oriented to person, place, and time.     Cranial Nerves: No cranial nerve deficit.     Motor: No weakness.     Gait: Gait normal.  Psychiatric:        Mood and Affect: Mood normal.        Thought Content: Thought content normal.        Judgment: Judgment normal.      UC Treatments / Results  Labs (all labs ordered are listed, but only abnormal results are displayed) Labs Reviewed - No data to display  EKG   Radiology No results found.  Procedures Procedures (including critical care time)  Medications Ordered in UC Medications  dexamethasone (DECADRON) injection 10 mg (10 mg Intramuscular Given 07/23/23 1810)    Initial Impression / Assessment and Plan / UC Course  I have reviewed the triage vital signs and the nursing notes.  Pertinent labs & imaging results that were available during my care of the patient were reviewed by me and considered in my medical decision making (see chart for details).     Will treat migraine with IM Decadron,  Zofran, Zanaflex, over-the-counter pain relievers as needed.  Follow-up for worsening or unresolving symptoms.  Work note given.  Final Clinical Impressions(s) / UC Diagnoses    Final diagnoses:  Other migraine without status migrainosus, not intractable   Discharge Instructions   None    ED Prescriptions     Medication Sig Dispense Auth. Provider   ondansetron (ZOFRAN-ODT) 4 MG disintegrating tablet Take 1 tablet (4 mg total) by mouth every 8 (eight) hours as needed for nausea or vomiting. 20 tablet Particia Nearing, [place]   tiZANidine (ZANAFLEX) 4 MG capsule Take 1 capsule (4 mg total) by mouth 3 (three) times daily as needed for muscle spasms. Do not drink alcohol or drive while taking this medication.  May cause drowsiness. 15 capsule Particia Nearing, [place]      PDMP not reviewed this encounter.   Particia Nearing, [place] 07/23/23 1815

## 2023-08-15 ENCOUNTER — Emergency Department (HOSPITAL_COMMUNITY)
Admission: EM | Admit: 2023-08-15 | Discharge: 2023-08-15 | Disposition: A | Discharge status: Home / Self Care | Payer: 59 | Attending: Emergency Medicine | Admitting: Emergency Medicine

## 2023-08-15 ENCOUNTER — Encounter (HOSPITAL_COMMUNITY): Payer: Self-pay | Admitting: Emergency Medicine

## 2023-08-15 ENCOUNTER — Other Ambulatory Visit: Payer: Self-pay

## 2023-08-15 ENCOUNTER — Emergency Department (HOSPITAL_COMMUNITY): Payer: 59

## 2023-08-15 DIAGNOSIS — Z3A01 Less than 8 weeks gestation of pregnancy: Secondary | ICD-10-CM | POA: Diagnosis not present

## 2023-08-15 DIAGNOSIS — O3680X0 Pregnancy with inconclusive fetal viability, not applicable or unspecified: Secondary | ICD-10-CM | POA: Diagnosis not present

## 2023-08-15 DIAGNOSIS — N390 Urinary tract infection, site not specified: Secondary | ICD-10-CM

## 2023-08-15 DIAGNOSIS — O208 Other hemorrhage in early pregnancy: Secondary | ICD-10-CM | POA: Diagnosis not present

## 2023-08-15 DIAGNOSIS — O09511 Supervision of elderly primigravida, first trimester: Secondary | ICD-10-CM | POA: Diagnosis not present

## 2023-08-15 DIAGNOSIS — O3481 Maternal care for other abnormalities of pelvic organs, first trimester: Secondary | ICD-10-CM | POA: Diagnosis not present

## 2023-08-15 DIAGNOSIS — O200 Threatened abortion: Secondary | ICD-10-CM | POA: Diagnosis not present

## 2023-08-15 DIAGNOSIS — O26851 Spotting complicating pregnancy, first trimester: Secondary | ICD-10-CM | POA: Diagnosis not present

## 2023-08-15 DIAGNOSIS — O26891 Other specified pregnancy related conditions, first trimester: Secondary | ICD-10-CM | POA: Diagnosis not present

## 2023-08-15 DIAGNOSIS — R1084 Generalized abdominal pain: Secondary | ICD-10-CM

## 2023-08-15 DIAGNOSIS — O2341 Unspecified infection of urinary tract in pregnancy, first trimester: Secondary | ICD-10-CM | POA: Diagnosis not present

## 2023-08-15 LAB — URINALYSIS, ROUTINE W REFLEX MICROSCOPIC
Bilirubin Urine: NEGATIVE
Glucose, UA: NEGATIVE mg/dL
Ketones, ur: 20 mg/dL — AB
Nitrite: NEGATIVE
Protein, ur: 100 mg/dL — AB
Specific Gravity, Urine: 1.023 (ref 1.005–1.030)
WBC, UA: >50 WBC/hpf (ref 0–5)
pH: 5 (ref 5.0–8.0)

## 2023-08-15 LAB — CBC WITH DIFFERENTIAL/PLATELET
Abs Immature Granulocytes: 0.08 10*3/uL — ABNORMAL HIGH (ref 0.00–0.07)
Basophils Absolute: 0 10*3/uL (ref 0.0–0.1)
Basophils Relative: 0 %
Eosinophils Absolute: 0 10*3/uL (ref 0.0–0.5)
Eosinophils Relative: 0 %
HCT: 37.6 % (ref 36.0–46.0)
Hemoglobin: 13 g/dL (ref 12.0–15.0)
Immature Granulocytes: 1 %
Lymphocytes Relative: 4 %
Lymphs Abs: 0.7 10*3/uL (ref 0.7–4.0)
MCH: 32.8 pg (ref 26.0–34.0)
MCHC: 34.6 g/dL (ref 30.0–36.0)
MCV: 94.9 fL (ref 80.0–100.0)
Monocytes Absolute: 1.1 10*3/uL — ABNORMAL HIGH (ref 0.1–1.0)
Monocytes Relative: 7 %
Neutro Abs: 14.9 10*3/uL — ABNORMAL HIGH (ref 1.7–7.7)
Neutrophils Relative %: 88 %
Platelets: 226 10*3/uL (ref 150–400)
RBC: 3.96 MIL/uL (ref 3.87–5.11)
RDW: 13.1 % (ref 11.5–15.5)
WBC: 16.7 10*3/uL — ABNORMAL HIGH (ref 4.0–10.5)
nRBC: 0 % (ref 0.0–0.2)

## 2023-08-15 LAB — COMPREHENSIVE METABOLIC PANEL
ALT: 41 U/L (ref 0–44)
AST: 27 U/L (ref 15–41)
Albumin: 3.6 g/dL (ref 3.5–5.0)
Alkaline Phosphatase: 110 U/L (ref 38–126)
Anion gap: 11 (ref 5–15)
BUN: 8 mg/dL (ref 6–20)
CO2: 20 mmol/L — ABNORMAL LOW (ref 22–32)
Calcium: 9.1 mg/dL (ref 8.9–10.3)
Chloride: 99 mmol/L (ref 98–111)
Creatinine, Ser: 0.68 mg/dL (ref 0.44–1.00)
GFR, Estimated: >60 mL/min (ref >60)
Glucose, Bld: 134 mg/dL — ABNORMAL HIGH (ref 70–99)
Potassium: 2.9 mmol/L — ABNORMAL LOW (ref 3.5–5.1)
Sodium: 130 mmol/L — ABNORMAL LOW (ref 135–145)
Total Bilirubin: 0.8 mg/dL (ref <1.2)
Total Protein: 7.2 g/dL (ref 6.5–8.1)

## 2023-08-15 LAB — HCG, QUANTITATIVE, PREGNANCY: hCG, Beta Chain, Quant, S: 7736 m[IU]/mL — ABNORMAL HIGH (ref <5)

## 2023-08-15 MED ORDER — CEPHALEXIN 500 MG PO CAPS: 500.0000 mg | ORAL_CAPSULE | Freq: Two times a day (BID) | ORAL | 0 refills | Status: AC

## 2023-08-15 MED ORDER — ONDANSETRON HCL 4 MG/2ML IJ SOLN
4.0000 mg | Freq: Once | INTRAMUSCULAR | Status: AC
  Administered 2023-08-15: 4 mg via INTRAVENOUS
  Filled 2023-08-15: qty 2

## 2023-08-15 MED ORDER — POTASSIUM CHLORIDE IN NACL 20-0.9 MEQ/L-% IV SOLN
Freq: Once | INTRAVENOUS | Status: AC
  Filled 2023-08-15: qty 1000

## 2023-08-15 MED ORDER — ACETAMINOPHEN 500 MG PO TABS
1000.0000 mg | ORAL_TABLET | Freq: Once | ORAL | Status: AC
  Administered 2023-08-15: 1000 mg via ORAL
  Filled 2023-08-15: qty 2

## 2023-08-15 MED ORDER — SODIUM CHLORIDE 0.9 % IV BOLUS
1000.0000 mL | Freq: Once | INTRAVENOUS | Status: AC
  Administered 2023-08-15: 1000 mL via INTRAVENOUS

## 2023-08-15 NOTE — ED Triage Notes (Signed)
Pt c/o pain to suprapubic/lower abd area x 1 week intermittent with radiation to lower back and down both legs. Preg test positive. Last period oct 28.  Sharp stabbing pain to this area and c/o "spotting". Color wnl. nad

## 2023-08-15 NOTE — ED Provider Notes (Signed)
[place] [facility] AT [facility] Provider Note   CSN: [id number] Arrival date & time: 08/15/23  6962     History  Chief Complaint  Patient presents with   Abdominal Pain    Valerie Robinson is a 37 y.o. female.  HPI Patient presents with concern of suprapubic pain, occasional vaginal spotting.  She has 4 pregnancies, and this pregnancy was diagnosed end of October, following last menstrual cycle.  She has not seen her obstetrician.  She has 3 living children.  She notes that over the past week she has had a suprapubic pain, waxing, waning severity with some radiation to the back midline.  There is some vaginal spotting as above, no vomiting, no fever.  She has not seen her obstetrician, it is unclear why she waited until today for evaluation.    Home Medications Prior to Admission medications   Medication Sig Start Date End Date Taking? Authorizing Provider  cephALEXin (KEFLEX) 500 MG capsule Take 1 capsule (500 mg total) by mouth 2 (two) times daily for 5 days. 08/15/23 08/20/23 Yes Gerhard Munch, MD  acetaminophen (TYLENOL) 500 MG tablet Take 500 mg by mouth every 6 (six) hours as needed.    [provider]  ondansetron (ZOFRAN-ODT) 4 MG disintegrating tablet Take 1 tablet (4 mg total) by mouth every 8 (eight) hours as needed for nausea or vomiting. 06/29/23   Valentino Nose, NP  ondansetron (ZOFRAN-ODT) 4 MG disintegrating tablet Take 1 tablet (4 mg total) by mouth every 8 (eight) hours as needed for nausea or vomiting. 07/23/23   Particia Nearing, PA-C  tiZANidine (ZANAFLEX) 4 MG capsule Take 1 capsule (4 mg total) by mouth 3 (three) times daily as needed for muscle spasms. Do not drink alcohol or drive while taking this medication.  May cause drowsiness. 07/23/23   Particia Nearing, PA-C      Allergies    Gabapentin, Naprosyn [naproxen], and Tramadol    Review of Systems   Review of Systems  Physical Exam Updated Vital  Signs BP (!) 107/55   Pulse 78   Temp 98.3 F (36.8 C) (Oral)   Resp 18   LMP 06/21/2023 (Approximate)   SpO2 98%  Physical Exam Vitals and nursing note reviewed.  Constitutional:      General: She is not in acute distress.    Appearance: She is well-developed.  HENT:     Head: Normocephalic and atraumatic.  Eyes:     Conjunctiva/sclera: Conjunctivae normal.  Cardiovascular:     Rate and Rhythm: Regular rhythm. Tachycardia present.  Pulmonary:     Effort: Pulmonary effort is normal. No respiratory distress.     Breath sounds: No stridor.  Abdominal:     General: There is no distension.     Tenderness: There is abdominal tenderness in the suprapubic area. There is no guarding or rebound. Negative signs include Murphy's sign and McBurney's sign.  Skin:    General: Skin is warm and dry.  Neurological:     Mental Status: She is alert and oriented to person, place, and time.     Cranial Nerves: No cranial nerve deficit.  Psychiatric:        Mood and Affect: Mood normal.     ED Results / Procedures / Treatments   Labs (all labs ordered are listed, but only abnormal results are displayed) Labs Reviewed  CBC WITH DIFFERENTIAL/PLATELET - Abnormal; Notable for the following components:      Result Value  WBC 16.7 (*)    Neutro Abs 14.9 (*)    Monocytes Absolute 1.1 (*)    Abs Immature Granulocytes 0.08 (*)    All other components within normal limits  COMPREHENSIVE METABOLIC PANEL - Abnormal; Notable for the following components:   Sodium 130 (*)    Potassium 2.9 (*)    CO2 20 (*)    Glucose, Bld 134 (*)    All other components within normal limits  URINALYSIS, ROUTINE W REFLEX MICROSCOPIC - Abnormal; Notable for the following components:   Color, Urine AMBER (*)    APPearance HAZY (*)    Hgb urine dipstick LARGE (*)    Ketones, ur 20 (*)    Protein, ur 100 (*)    Leukocytes,Ua SMALL (*)    Bacteria, UA FEW (*)    All other components within normal limits  HCG,  QUANTITATIVE, PREGNANCY - Abnormal; Notable for the following components:   hCG, Beta Chain, Quant, S 7,736 (*)    All other components within normal limits    EKG None  Radiology US OB LESS THAN 14 WEEKS WITH OB TRANSVAGINAL Result Date: 08/15/2023 CLINICAL DATA:  37 year old pregnant female with pelvic pain and spotting for 1 week. Quantitative beta HCG 7,736. EDC by LMP: 03/27/2024, projecting to an expected gestational age of [redacted] weeks 6 days. EXAM: OBSTETRIC <14 WK [place] AND TRANSVAGINAL OB US TECHNIQUE: Both transabdominal and transvaginal ultrasound examinations were performed for complete evaluation of the gestation as well as the maternal uterus, adnexal regions, and pelvic cul-de-sac. Transvaginal technique was performed to assess early pregnancy. COMPARISON:  No prior scans from this gestation. 01/30/2022 pelvic sonogram. FINDINGS: Intrauterine gestational sac: Single Yolk sac:  Visualized. Embryo:  Visualized. Embryonic Cardiac Activity: Not Visualized. CRL:  3.7 mm   6 w   0 d                  [place] EDC: 04/07/2024 Subchorionic hemorrhage: Small perigestational bleed measuring 0.8 x 0.4 x 0.5 cm in the lower uterine cavity, involving less than 30% of the gestational sac circumference. Maternal uterus/adnexae: Anteverted uterus.  No uterine fibroids. Right ovary measures 4.2 x 2.8 x 3.7 cm and contains a simple 3.6 x 2.4 x 3.2 cm cyst. Left ovary measures 2.5 x 1.5 x 1.7 cm and is normal. No suspicious ovarian or adnexal masses. No abnormal free fluid in the pelvis. IMPRESSION: Single intrauterine gestation measuring 6 weeks 0 days by crown-rump length, discordant with the expected gestational age of [redacted] weeks 6 days by provided dating. No embryonic cardiac activity detected. Small perigestational bleed. These findings represent poor prognostic factors, however the embryo measures less than 7 mm and thus definitive assessment of pregnancy viability cannot be made at this time. Recommend follow-up US in  10-14 days for definitive diagnosis. This recommendation follows SRU consensus guidelines: Diagnostic Criteria for Nonviable Pregnancy Early in the First Trimester. Malva Limes Med 2013; 829:5621-30. Simple 3.6 cm right ovarian cyst, which can be reassessed on follow-up ultrasound. No suspicious ovarian or adnexal masses. Electronically Signed   By: Delbert Phenix M.D.   On: 08/15/2023 12:33    Procedures Procedures    Medications Ordered in ED Medications  0.9 % NaCl with KCl 20 mEq/ L  infusion ( Intravenous New Bag/Given 08/15/23 1253)  sodium chloride 0.9 % bolus 1,000 mL (0 mLs Intravenous Stopped 08/15/23 1009)  ondansetron (ZOFRAN) injection 4 mg (4 mg Intravenous Given 08/15/23 0753)    ED Course/ Medical Decision  Making/ A&P                                 Medical Decision Making G4, P3 adult female presents with suprapubic pain, vaginal spotting.  With concern for pregnancy broad differential including bleeding in pregnancy, threatened miscarriage, urinary tract infection, resumption of menstrual cycle with failed pregnancy as well as other nonpregnancy concerns including UTI, torsion, other abdominal pain considered.  Labs sent, fluids, Zofran ordered.  Amount and/or Complexity of Data Reviewed External Data Reviewed: notes. Labs: ordered. Decision-making details documented in ED Course. Radiology: ordered.  Risk Prescription drug management. Decision regarding hospitalization. Diagnosis or treatment significantly limited by social determinants of health.  Patient in similar condition on repeat exam 1:54 PM Now ultrasound results are available.  On review is clear the patient has early pregnancy versus inevitable miscarriage versus threatened miscarriage with spotting, but no evidence for ectopic, no hemodynamic instability.  Some evidence for urinary tract infection, but no evidence of bacteremia, sepsis.  Patient comfortable with following up with her obstetrician, will start  antibiotics, is appropriate for discharge with outpatient follow-up.        Final Clinical Impression(s) / ED Diagnoses Final diagnoses:  Generalized abdominal pain  Lower urinary tract infectious disease  Threatened miscarriage    Rx / DC Orders ED Discharge Orders          Ordered    cephALEXin (KEFLEX) 500 MG capsule  2 times daily        08/15/23 1353              Gerhard Munch, MD 08/15/23 1354

## 2023-08-15 NOTE — ED Notes (Signed)
Pt transported to US

## 2023-08-15 NOTE — ED Notes (Signed)
Pt taking to [place]. Will start IV fluids when she returns

## 2023-08-15 NOTE — Discharge Instructions (Signed)
Be sure to monitor your condition carefully.  Do not hesitate to return here for concerning changes.  Otherwise follow-up with your physician.

## 2023-09-21 ENCOUNTER — Encounter: Payer: Self-pay | Admitting: Emergency Medicine

## 2023-09-21 ENCOUNTER — Ambulatory Visit
Admission: EM | Admit: 2023-09-21 | Discharge: 2023-09-21 | Disposition: A | Discharge status: Home / Self Care | Payer: 59 | Attending: Nurse Practitioner | Admitting: Nurse Practitioner

## 2023-09-21 DIAGNOSIS — R519 Headache, unspecified: Secondary | ICD-10-CM

## 2023-09-21 MED ORDER — KETOROLAC TROMETHAMINE 30 MG/ML IJ SOLN
30.0000 mg | Freq: Once | INTRAMUSCULAR | Status: AC
  Administered 2023-09-21: 30 mg via INTRAMUSCULAR

## 2023-09-21 MED ORDER — ONDANSETRON 4 MG PO TBDP: 4.0000 mg | ORAL_TABLET | Freq: Three times a day (TID) | ORAL | 0 refills | Status: DC | PRN

## 2023-09-21 MED ORDER — DEXAMETHASONE SODIUM PHOSPHATE 10 MG/ML IJ SOLN
10.0000 mg | INTRAMUSCULAR | Status: AC
  Administered 2023-09-21: 10 mg via INTRAMUSCULAR

## 2023-09-21 NOTE — ED Triage Notes (Signed)
Headache x 3 days

## 2023-09-21 NOTE — Discharge Instructions (Addendum)
You were given injections of Decadron 10 mg and Toradol 30 mg.  Do not take any additional NSAIDs today such as ibuprofen, Aleve, Advil, Motrin, or naproxen.  Recommend Tylenol arthritis strength 650 mg tablets as needed for breakthrough pain. Take medication as prescribed. Increase fluids and allow for plenty of rest. Recommend sleeping in a dimly lit room, and using a cool cloth across her forehead while symptoms persist. Go to the emergency department immediately if you experience worsening headache, weakness in your legs or feet, confusion, or other concerns. Recommend following up with your PCP/urology if you develop reoccurring headaches, with worsening severity. Follow-up as needed.

## 2023-09-21 NOTE — ED Provider Notes (Signed)
[facility]    CSN: [id number] Arrival date & time: 09/21/23  1406      History   Chief Complaint No chief complaint on file.   HPI Valerie Robinson is a 38 y.o. female.   The history is provided by the patient.   Patient presents for complaints of headache that has been present for the past 3 days.  Patient states headache is located behind both her eyes, and is radiating across her forehead.  She complains of nausea, and mild light sensitivity.  She denies fever, chills, nasal congestion, ear pain, jaw pain, weakness, unstable gait, neck stiffness, blurred vision, dizziness, visual changes, vomiting,diarrhea, mental status changes, extremity weakness, numbness, or tingling..  Patient denies aura.  Patient states headache does improve after Tylenol, but returns.  Patient with history of migraines.   Past Medical History:  Diagnosis Date   Anxiety    Bradycardia    pt says was seen at [facility] for bradycardia but was never put on any medication for it.   Chronic back pain    Chronic pelvic pain in female    Drug-seeking behavior 2015   Ovarian cyst    Renal disorder    kidney stone    There are no active problems to display for this patient.   Past Surgical History:  Procedure Laterality Date   APPENDECTOMY     CESAREAN SECTION     x2   CHOLECYSTECTOMY N/A 03/03/2013   Procedure: LAPAROSCOPIC CHOLECYSTECTOMY;  Surgeon: Dalia Heading, MD;  Location: [facility];  Service: General;  Laterality: N/A;   CHOLECYSTECTOMY  2014    OB History     Gravida  4   Para  3   Term  3   Preterm      AB      Living  3      SAB      IAB      Ectopic      Multiple      Live Births               Home Medications    Prior to Admission medications   Medication Sig Start Date End Date Taking? Authorizing Provider  acetaminophen (TYLENOL) 500 MG tablet Take 500 mg by mouth every 6 (six) hours as needed.    [provider]    Family  History Family History  Family history unknown: Yes    Social History Social History   Tobacco Use   Smoking status: Every Day    Current packs/day: 0.50    Average packs/day: 0.5 packs/day for 3.0 years (1.5 ttl pk-yrs)    Types: Cigarettes   Smokeless tobacco: Never  Vaping Use   Vaping status: Former   Substances: Nicotine, Flavoring  Substance Use Topics   Alcohol use: No   Drug use: No    Comment: used to     Allergies   Gabapentin, Naprosyn [naproxen], and Tramadol   Review of Systems Review of Systems Per HPI  Physical Exam Triage Vital Signs ED Triage Vitals [09/21/23 1435]  Encounter Vitals Group     BP 135/85     Systolic BP Percentile      Diastolic BP Percentile      Pulse Rate 74     Resp 18     Temp 98 F (36.7 C)     Temp Source Oral     SpO2 97 %     Weight  Height      Head Circumference      Peak Flow      Pain Score      Pain Loc      Pain Education      Exclude from Growth Chart    No data found.  Updated Vital Signs BP 135/85 (BP Location: Right Arm)   Pulse 74   Temp 98 F (36.7 C) (Oral)   Resp 18   LMP 09/13/2023 (Exact Date)   SpO2 97%   Visual Acuity Right Eye Distance:   Left Eye Distance:   Bilateral Distance:    Right Eye Near:   Left Eye Near:    Bilateral Near:     Physical Exam Vitals and nursing note reviewed.  Constitutional:      General: She is not in acute distress.    Appearance: Normal appearance.  HENT:     Head: Normocephalic.     Right Ear: Tympanic membrane, ear canal and external ear normal.     Left Ear: Tympanic membrane, ear canal and external ear normal.     Nose: Nose normal.     Mouth/Throat:     Mouth: Mucous membranes are moist.  Eyes:     Extraocular Movements: Extraocular movements intact.     Conjunctiva/sclera: Conjunctivae normal.     Pupils: Pupils are equal, round, and reactive to light.  Cardiovascular:     Rate and Rhythm: Normal rate and regular rhythm.      Pulses: Normal pulses.     Heart sounds: Normal heart sounds.  Pulmonary:     Effort: Pulmonary effort is normal. No respiratory distress.     Breath sounds: Normal breath sounds. No stridor. No wheezing, rhonchi or rales.  Abdominal:     General: Bowel sounds are normal.     Palpations: Abdomen is soft.     Tenderness: There is no abdominal tenderness.  Musculoskeletal:     Cervical back: Normal range of motion.  Lymphadenopathy:     Cervical: No cervical adenopathy.  Skin:    General: Skin is warm and dry.  Neurological:     General: No focal deficit present.     Mental Status: She is alert and oriented to person, place, and time.     GCS: GCS eye subscore is 4. GCS verbal subscore is 5. GCS motor subscore is 6.     Cranial Nerves: Cranial nerves 2-12 are intact.     Sensory: Sensation is intact.     Motor: Motor function is intact.     Coordination: Coordination is intact.     Gait: Gait is intact.  Psychiatric:        Mood and Affect: Mood normal.        Behavior: Behavior normal.      UC Treatments / Results  Labs (all labs ordered are listed, but only abnormal results are displayed) Labs Reviewed - No data to display  EKG   Radiology No results found.  Procedures Procedures (including critical care time)  Medications Ordered in UC Medications  ketorolac (TORADOL) 30 MG/ML injection 30 mg (30 mg Intramuscular Given 09/21/23 1458)  dexamethasone (DECADRON) injection 10 mg (10 mg Intramuscular Given 09/21/23 1457)    Initial Impression / Assessment and Plan / UC Course  I have reviewed the triage vital signs and the nursing notes.  Pertinent labs & imaging results that were available during my care of the patient were reviewed by me and considered in my medical  decision making (see chart for details).  On exam, neurological exam is within normal limits, no deficits noted.  Patient with history of migraine. Toradol 30 mg IM and Decadron 10 mg IM administered.   Supportive care recommendations were provided and discussed with the patient to include continuing Tylenol, increasing fluids, and allowing for plenty of rest.  Patient was given strict ER follow-up precautions.  Patient also advised to follow-up with her PCP/neurology for reoccurring or worsening headaches.  Patient was in agreement with this plan of care and verbalizes understanding.  All questions were answered.  Patient stable for discharge.     Final Clinical Impressions(s) / UC Diagnoses   Final diagnoses:  None   Discharge Instructions   None    ED Prescriptions   None    PDMP not reviewed this encounter.   Abran Cantor, NP 09/21/23 1507

## 2023-11-20 ENCOUNTER — Emergency Department (HOSPITAL_COMMUNITY): Payer: MEDICAID

## 2023-11-20 ENCOUNTER — Inpatient Hospital Stay (HOSPITAL_COMMUNITY)
Admission: EM | Admit: 2023-11-20 | Discharge: 2023-11-22 | DRG: 492 | Disposition: A | Discharge status: Home / Self Care | Payer: MEDICAID | Attending: Family Medicine | Admitting: Family Medicine

## 2023-11-20 ENCOUNTER — Ambulatory Visit (HOSPITAL_BASED_OUTPATIENT_CLINIC_OR_DEPARTMENT_OTHER): Payer: Self-pay | Admitting: Orthopaedic Surgery

## 2023-11-20 ENCOUNTER — Other Ambulatory Visit: Payer: Self-pay

## 2023-11-20 ENCOUNTER — Encounter (HOSPITAL_COMMUNITY): Payer: Self-pay

## 2023-11-20 DIAGNOSIS — F1210 Cannabis abuse, uncomplicated: Secondary | ICD-10-CM | POA: Diagnosis present

## 2023-11-20 DIAGNOSIS — S32422A Displaced fracture of posterior wall of left acetabulum, initial encounter for closed fracture: Secondary | ICD-10-CM

## 2023-11-20 DIAGNOSIS — S82142A Displaced bicondylar fracture of left tibia, initial encounter for closed fracture: Secondary | ICD-10-CM

## 2023-11-20 DIAGNOSIS — Y92410 Unspecified street and highway as the place of occurrence of the external cause: Secondary | ICD-10-CM

## 2023-11-20 DIAGNOSIS — F1910 Other psychoactive substance abuse, uncomplicated: Secondary | ICD-10-CM | POA: Diagnosis not present

## 2023-11-20 DIAGNOSIS — F1110 Opioid abuse, uncomplicated: Secondary | ICD-10-CM | POA: Diagnosis present

## 2023-11-20 DIAGNOSIS — E66813 Obesity, class 3: Secondary | ICD-10-CM | POA: Diagnosis present

## 2023-11-20 DIAGNOSIS — F17210 Nicotine dependence, cigarettes, uncomplicated: Secondary | ICD-10-CM | POA: Diagnosis present

## 2023-11-20 DIAGNOSIS — F1410 Cocaine abuse, uncomplicated: Secondary | ICD-10-CM | POA: Diagnosis present

## 2023-11-20 DIAGNOSIS — R102 Pelvic and perineal pain: Secondary | ICD-10-CM | POA: Diagnosis present

## 2023-11-20 DIAGNOSIS — Z87442 Personal history of urinary calculi: Secondary | ICD-10-CM

## 2023-11-20 DIAGNOSIS — Z9049 Acquired absence of other specified parts of digestive tract: Secondary | ICD-10-CM

## 2023-11-20 DIAGNOSIS — Z604 Social exclusion and rejection: Secondary | ICD-10-CM | POA: Diagnosis present

## 2023-11-20 DIAGNOSIS — F419 Anxiety disorder, unspecified: Secondary | ICD-10-CM | POA: Diagnosis present

## 2023-11-20 DIAGNOSIS — S52202A Unspecified fracture of shaft of left ulna, initial encounter for closed fracture: Secondary | ICD-10-CM

## 2023-11-20 DIAGNOSIS — D72829 Elevated white blood cell count, unspecified: Secondary | ICD-10-CM | POA: Diagnosis present

## 2023-11-20 DIAGNOSIS — G8929 Other chronic pain: Secondary | ICD-10-CM | POA: Diagnosis present

## 2023-11-20 DIAGNOSIS — T1490XA Injury, unspecified, initial encounter: Secondary | ICD-10-CM | POA: Diagnosis not present

## 2023-11-20 DIAGNOSIS — S82145A Nondisplaced bicondylar fracture of left tibia, initial encounter for closed fracture: Secondary | ICD-10-CM | POA: Diagnosis not present

## 2023-11-20 DIAGNOSIS — Z885 Allergy status to narcotic agent status: Secondary | ICD-10-CM

## 2023-11-20 DIAGNOSIS — M549 Dorsalgia, unspecified: Secondary | ICD-10-CM | POA: Diagnosis present

## 2023-11-20 DIAGNOSIS — S5292XA Unspecified fracture of left forearm, initial encounter for closed fracture: Secondary | ICD-10-CM

## 2023-11-20 DIAGNOSIS — Z6841 Body Mass Index (BMI) 40.0 and over, adult: Secondary | ICD-10-CM

## 2023-11-20 DIAGNOSIS — Z888 Allergy status to other drugs, medicaments and biological substances status: Secondary | ICD-10-CM

## 2023-11-20 DIAGNOSIS — E669 Obesity, unspecified: Secondary | ICD-10-CM | POA: Diagnosis not present

## 2023-11-20 DIAGNOSIS — S82125A Nondisplaced fracture of lateral condyle of left tibia, initial encounter for closed fracture: Secondary | ICD-10-CM

## 2023-11-20 DIAGNOSIS — Z5948 Other specified lack of adequate food: Secondary | ICD-10-CM

## 2023-11-20 DIAGNOSIS — Z886 Allergy status to analgesic agent status: Secondary | ICD-10-CM

## 2023-11-20 DIAGNOSIS — Z5941 Food insecurity: Secondary | ICD-10-CM

## 2023-11-20 DIAGNOSIS — S52302A Unspecified fracture of shaft of left radius, initial encounter for closed fracture: Secondary | ICD-10-CM | POA: Diagnosis present

## 2023-11-20 LAB — CBC WITH DIFFERENTIAL/PLATELET
Abs Immature Granulocytes: 0.07 10*3/uL (ref 0.00–0.07)
Basophils Absolute: 0 10*3/uL (ref 0.0–0.1)
Basophils Relative: 0 %
Eosinophils Absolute: 0 10*3/uL (ref 0.0–0.5)
Eosinophils Relative: 0 %
HCT: 38.8 % (ref 36.0–46.0)
Hemoglobin: 12.8 g/dL (ref 12.0–15.0)
Immature Granulocytes: 0 %
Lymphocytes Relative: 8 %
Lymphs Abs: 1.2 10*3/uL (ref 0.7–4.0)
MCH: 33.1 pg (ref 26.0–34.0)
MCHC: 33 g/dL (ref 30.0–36.0)
MCV: 100.3 fL — ABNORMAL HIGH (ref 80.0–100.0)
Monocytes Absolute: 0.7 10*3/uL (ref 0.1–1.0)
Monocytes Relative: 5 %
Neutro Abs: 13.5 10*3/uL — ABNORMAL HIGH (ref 1.7–7.7)
Neutrophils Relative %: 87 %
Platelets: 187 10*3/uL (ref 150–400)
RBC: 3.87 MIL/uL (ref 3.87–5.11)
RDW: 13 % (ref 11.5–15.5)
WBC: 15.6 10*3/uL — ABNORMAL HIGH (ref 4.0–10.5)
nRBC: 0 % (ref 0.0–0.2)

## 2023-11-20 LAB — URINALYSIS, ROUTINE W REFLEX MICROSCOPIC
Bilirubin Urine: NEGATIVE
Glucose, UA: NEGATIVE mg/dL
Ketones, ur: NEGATIVE mg/dL
Nitrite: NEGATIVE
Protein, ur: NEGATIVE mg/dL
Specific Gravity, Urine: 1.008 (ref 1.005–1.030)
pH: 7 (ref 5.0–8.0)

## 2023-11-20 LAB — RAPID URINE DRUG SCREEN, HOSP PERFORMED
Amphetamines: NOT DETECTED
Barbiturates: NOT DETECTED
Benzodiazepines: POSITIVE — AB
Cocaine: POSITIVE — AB
Opiates: POSITIVE — AB
Tetrahydrocannabinol: POSITIVE — AB

## 2023-11-20 LAB — BASIC METABOLIC PANEL WITH GFR
Anion gap: 10 (ref 5–15)
BUN: 11 mg/dL (ref 6–20)
CO2: 19 mmol/L — ABNORMAL LOW (ref 22–32)
Calcium: 9.2 mg/dL (ref 8.9–10.3)
Chloride: 107 mmol/L (ref 98–111)
Creatinine, Ser: 0.6 mg/dL (ref 0.44–1.00)
GFR, Estimated: >60 mL/min (ref >60)
Glucose, Bld: 104 mg/dL — ABNORMAL HIGH (ref 70–99)
Potassium: 3.5 mmol/L (ref 3.5–5.1)
Sodium: 136 mmol/L (ref 135–145)

## 2023-11-20 LAB — CBC
HCT: 38.8 % (ref 36.0–46.0)
Hemoglobin: 12.9 g/dL (ref 12.0–15.0)
MCH: 32.6 pg (ref 26.0–34.0)
MCHC: 33.2 g/dL (ref 30.0–36.0)
MCV: 98 fL (ref 80.0–100.0)
Platelets: 227 10*3/uL (ref 150–400)
RBC: 3.96 MIL/uL (ref 3.87–5.11)
RDW: 13 % (ref 11.5–15.5)
WBC: 13.7 10*3/uL — ABNORMAL HIGH (ref 4.0–10.5)
nRBC: 0 % (ref 0.0–0.2)

## 2023-11-20 LAB — CBG MONITORING, ED: Glucose-Capillary: 118 mg/dL — ABNORMAL HIGH (ref 70–99)

## 2023-11-20 LAB — CREATININE, SERUM
Creatinine, Ser: 0.67 mg/dL (ref 0.44–1.00)
GFR, Estimated: >60 mL/min (ref >60)

## 2023-11-20 LAB — HCG, SERUM, QUALITATIVE: Preg, Serum: NEGATIVE

## 2023-11-20 LAB — HIV ANTIBODY (ROUTINE TESTING W REFLEX): HIV Screen 4th Generation wRfx: NONREACTIVE

## 2023-11-20 LAB — ETHANOL: Alcohol, Ethyl (B): <10 mg/dL (ref <10)

## 2023-11-20 MED ORDER — ENOXAPARIN SODIUM 60 MG/0.6ML IJ SOSY
60.0000 mg | PREFILLED_SYRINGE | INTRAMUSCULAR | Status: DC
  Administered 2023-11-20 – 2023-11-22 (×2): 60 mg via SUBCUTANEOUS
  Filled 2023-11-20 (×2): qty 0.6

## 2023-11-20 MED ORDER — FENTANYL CITRATE PF 50 MCG/ML IJ SOSY
50.0000 ug | PREFILLED_SYRINGE | Freq: Once | INTRAMUSCULAR | Status: AC
  Administered 2023-11-20: 50 ug via INTRAVENOUS
  Filled 2023-11-20: qty 1

## 2023-11-20 MED ORDER — ACETAMINOPHEN 325 MG PO TABS
650.0000 mg | ORAL_TABLET | Freq: Four times a day (QID) | ORAL | Status: DC
  Administered 2023-11-20 – 2023-11-22 (×8): 650 mg via ORAL
  Filled 2023-11-20 (×8): qty 2

## 2023-11-20 MED ORDER — IOHEXOL 350 MG/ML SOLN
75.0000 mL | Freq: Once | INTRAVENOUS | Status: AC | PRN
  Administered 2023-11-20: 75 mL via INTRAVENOUS

## 2023-11-20 MED ORDER — ONDANSETRON HCL 4 MG/2ML IJ SOLN: 4.0000 mg | Freq: Four times a day (QID) | INTRAMUSCULAR | Status: DC | PRN

## 2023-11-20 MED ORDER — CEFAZOLIN SODIUM-DEXTROSE 2-4 GM/100ML-% IV SOLN
2.0000 g | INTRAVENOUS | Status: AC
  Administered 2023-11-21: 2 g via INTRAVENOUS
  Filled 2023-11-20: qty 100

## 2023-11-20 MED ORDER — FENTANYL CITRATE PF 50 MCG/ML IJ SOSY
25.0000 ug | PREFILLED_SYRINGE | Freq: Once | INTRAMUSCULAR | Status: AC
  Administered 2023-11-20: 25 ug via INTRAVENOUS
  Filled 2023-11-20: qty 1

## 2023-11-20 MED ORDER — PANTOPRAZOLE SODIUM 40 MG PO TBEC
40.0000 mg | DELAYED_RELEASE_TABLET | Freq: Every day | ORAL | Status: DC
  Administered 2023-11-20 – 2023-11-22 (×2): 40 mg via ORAL
  Filled 2023-11-20 (×2): qty 1

## 2023-11-20 MED ORDER — ACETAMINOPHEN 325 MG PO TABS
650.0000 mg | ORAL_TABLET | Freq: Four times a day (QID) | ORAL | Status: DC | PRN
  Administered 2023-11-20: 650 mg via ORAL
  Filled 2023-11-20: qty 2

## 2023-11-20 MED ORDER — ONDANSETRON HCL 4 MG PO TABS: 4.0000 mg | ORAL_TABLET | Freq: Four times a day (QID) | ORAL | Status: DC | PRN

## 2023-11-20 MED ORDER — OXYCODONE HCL 5 MG PO TABS
5.0000 mg | ORAL_TABLET | ORAL | Status: DC | PRN
  Administered 2023-11-20 – 2023-11-22 (×9): 5 mg via ORAL
  Filled 2023-11-20 (×9): qty 1

## 2023-11-20 MED ORDER — CHLORHEXIDINE GLUCONATE 4 % EX SOLN
60.0000 mL | Freq: Once | CUTANEOUS | Status: AC
  Administered 2023-11-21: 4 via TOPICAL
  Filled 2023-11-20: qty 60

## 2023-11-20 MED ORDER — ACETAMINOPHEN 650 MG RE SUPP: 650.0000 mg | Freq: Four times a day (QID) | RECTAL | Status: DC | PRN

## 2023-11-20 MED ORDER — TRANEXAMIC ACID-NACL 1000-0.7 MG/100ML-% IV SOLN
1000.0000 mg | INTRAVENOUS | Status: AC
  Administered 2023-11-21: 1000 mg via INTRAVENOUS

## 2023-11-20 MED ORDER — HYDROMORPHONE HCL 1 MG/ML IJ SOLN
1.0000 mg | Freq: Once | INTRAMUSCULAR | Status: AC
  Administered 2023-11-20: 1 mg via INTRAVENOUS
  Filled 2023-11-20: qty 1

## 2023-11-20 MED ORDER — ALPRAZOLAM 0.5 MG PO TABS
1.0000 mg | ORAL_TABLET | Freq: Three times a day (TID) | ORAL | Status: DC | PRN
  Administered 2023-11-21: 1 mg via ORAL
  Filled 2023-11-20: qty 2

## 2023-11-20 MED ORDER — HYDROMORPHONE HCL 1 MG/ML IJ SOLN
1.0000 mg | INTRAMUSCULAR | Status: DC | PRN
  Administered 2023-11-20 – 2023-11-22 (×12): 1 mg via INTRAVENOUS
  Filled 2023-11-20 (×12): qty 1
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
## 2023-11-20 NOTE — Progress Notes (Signed)
 Orthopedic Tech Progress Note Patient Details:  Valerie Robinson 1985-11-16 [id number]  Sugar tong applied to the LUE in best obtainable position and a knee immobilizer was applied to the LLE.   Ortho Devices Type of Ortho Device: Sugartong splint, Knee Immobilizer Ortho Device/Splint Location: LUE/LLE Ortho Device/Splint Interventions: Ordered, Application, Adjustment   Post Interventions Patient Tolerated: Fair, Poor Instructions Provided: Care of device, Adjustment of device  [name] 11/20/2023, 4:41 PM
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
## 2023-11-20 NOTE — ED Provider Notes (Signed)
 Patient's care assumed by me at 3:30 PM.  Patient is awaiting orthopedic consult from Dr. Lydia Guiles a.m. for left tibial plateau fracture and pending abdominal pelvis chest CT.  I evaluated patient in her room at approximately 345.  Patient is lying on the stretcher naked requesting pain medicine. Patient's CT chest abdomen pelvis returned and shows a left acetabular wall fracture with minimal displacement. Dr. Fabio Neighbors advised knee immobilizer and CT of patient's left knee. I discussed the patient with Dr. Derrell Lolling of trauma given multiple bone fractures.  He advised patient can be admitted to Ortho or medicine.  Orthopedist evaluating patient advised hospitalist admission.  I discussed the patient with hospitalist.  Who will admit.  Patient is placed in a long-arm splint and knee immobilizer.   Elson Areas, PA-C 11/20/23 1626    Melene Plan, DO 11/20/23 [id number]
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
## 2023-11-20 NOTE — ED Notes (Signed)
 [place] j collar applied to pt til c spine cleared

## 2023-11-20 NOTE — ED Notes (Signed)
 Patient transported to CT
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
## 2023-11-20 NOTE — Discharge Instructions (Addendum)
 Discharge Instructions    Attending Surgeon: [name], MD Office Phone Number: [phone]   Diagnosis and Procedures:    Surgeries Performed: Left tibial plateau open reduction internal fixation  Discharge Plan:    Diet: Resume usual diet. Begin with light or bland foods.  Drink plenty of fluids.  Activity:  Weight bearing as tolerated in knee immobilizer. You are advised to go home directly from the hospital or surgical center. Restrict your activities.  GENERAL INSTRUCTIONS: 1.  Please apply ice to your wound to help with swelling and inflammation. This will improve your comfort and your overall recovery following surgery.     2. Please call Dr. Serena Croissant office at [phone] with questions Monday-Friday during business hours. If no one answers, please leave a message and someone should get back to the patient within 24 hours. For emergencies please call 911 or proceed to the emergency room.   3. Patient to notify surgical team if experiences any of the following: Bowel/Bladder dysfunction, uncontrolled pain, nerve/muscle weakness, incision with increased drainage or redness, nausea/vomiting and Fever greater than 101.0 F.  Be alert for signs of infection including redness, streaking, odor, fever or chills. Be alert for excessive pain or bleeding and notify your surgeon immediately.  WOUND INSTRUCTIONS:   Leave your dressing, cast, or splint in place until your post operative visit.  Keep it clean and dry.  Always keep the incision clean and dry until the staples/sutures are removed. If there is no drainage from the incision you should keep it open to air. If there is drainage from the incision you must keep it covered at all times until the drainage stops  Do not soak in a bath tub, hot tub, pool, lake or other body of water until 21 days after your surgery and your incision is completely dry and healed.  If you have removable sutures (or staples) they must be  removed 10-14 days (unless otherwise instructed) from the day of your surgery.     1)  Elevate the extremity as much as possible.  2)  Keep the dressing clean and dry.  3)  Please call us if the dressing becomes wet or dirty.  4)  If you are experiencing worsening pain or worsening swelling, please call.     MEDICATIONS: Resume all previous home medications at the previous prescribed dose and frequency unless otherwise noted Start taking the  pain medications on an as-needed basis as prescribed  Please taper down pain medication over the next week following surgery.  Ideally you should not require a refill of any narcotic pain medication.  Take pain medication with food to minimize nausea. In addition to the prescribed pain medication, you may take over-the-counter pain relievers such as Tylenol.  Do NOT take additional tylenol if your pain medication already has tylenol in it.  Aspirin 325mg  daily per instructions on bottle. Narcotic policy: Per [facility] clinic policy, our goal is ensure optimal postoperative pain control with a multimodal pain management strategy. For all [facility] patients, our goal is to wean post-operative narcotic medications by 6 weeks post-operatively, and many times sooner. If this is not possible due to utilization of pain medication prior to surgery, your [facility] doctor will support your acute post-operative pain control for the first 6 weeks postoperatively, with a plan to transition you back to your primary pain team following that. Cyndia Skeeters will work to ensure a Therapist, occupational.       FOLLOWUP INSTRUCTIONS: 1. Follow up  at the Physical Therapy Clinic 3-4 days following surgery. This appointment should be scheduled unless other arrangements have been made.The Physical Therapy scheduling number is [phone] if an appointment has not already been arranged.  2. Contact Dr. Serena Croissant office during office hours at [phone] or the practice after hours line  at [phone] for non-emergencies. For medical emergencies call 911.

## 2023-11-20 NOTE — Progress Notes (Addendum)
 Pt complaining 10/10 pain in left arm and knee. I PRN Tylenol given and pt repositioned, slight relief expressed for short duration.

## 2023-11-20 NOTE — Assessment & Plan Note (Signed)
 Calculated BMI is 44,8

## 2023-11-20 NOTE — Assessment & Plan Note (Addendum)
 Multiple fractures. Left acetabular wall, left radius and ulnar proximal to mid diaphyses, lateral condyle of left tibia with intra articular extension.   Plan to continue pain control with oral scheduled acetaminophen (allergic ton non steroidal anti inflammatory agents)  As needed muscle relaxant with flexeril.  As needed oxycodone for moderate pain and IV hydromorphone for severe pain. (Allergic to morphine)  GI prophylaxis.  Follow up with orthopedics recommendations. NPO after midnight.

## 2023-11-20 NOTE — Assessment & Plan Note (Signed)
 No signs of acute intoxication or withdrawals.  Plan to continue neuro checks per unit protocol.

## 2023-11-20 NOTE — Assessment & Plan Note (Signed)
 Patient had alprazolam today for anxiety.  Will continue this as needed while in the hospital.  She will need referral to outpatient behavioral health.
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
## 2023-11-20 NOTE — Anesthesia Preprocedure Evaluation (Addendum)
 Anesthesia Evaluation  Patient identified by MRN, date of birth, ID band Patient awake    Reviewed: Allergy & Precautions, NPO status , Patient's Chart, lab work & pertinent test results  History of Anesthesia Complications Negative for: history of anesthetic complications  Airway Mallampati: II  TM Distance: >3 FB Neck ROM: Full    Dental  (+) Dental Advisory Given, Edentulous Upper   Pulmonary Current Smoker and Patient abstained from smoking.   Pulmonary exam normal        Cardiovascular negative cardio ROS Normal cardiovascular exam     Neuro/Psych  PSYCHIATRIC DISORDERS Anxiety     negative neurological ROS     GI/Hepatic negative GI ROS,,,(+)     substance abuse  cocaine use and marijuana use  Endo/Other    Class 3 obesity  Renal/GU negative Renal ROS     Musculoskeletal negative musculoskeletal ROS (+)  narcotic dependent  Abdominal   Peds  Hematology negative hematology ROS (+)   Anesthesia Other Findings Chronic back and pelvic pain Drug-seeking behavior   Reproductive/Obstetrics                             Anesthesia Physical Anesthesia Plan  ASA: 3  Anesthesia Plan: General   Post-op Pain Management: Tylenol PO (pre-op)*, Precedex and Ketamine IV*   Induction: Intravenous  PONV Risk Score and Plan: 2 and Treatment may vary due to age or medical condition, Ondansetron, Dexamethasone, Midazolam and Scopolamine patch - Pre-op  Airway Management Planned: Oral ETT  Additional Equipment: None  Intra-op Plan:   Post-operative Plan: Extubation in OR  Informed Consent: I have reviewed the patients History and Physical, chart, labs and discussed the procedure including the risks, benefits and alternatives for the proposed anesthesia with the patient or authorized representative who has indicated his/her understanding and acceptance.     Dental advisory given  Plan  Discussed with: CRNA and Anesthesiologist  Anesthesia Plan Comments:        Anesthesia Quick Evaluation

## 2023-11-21 ENCOUNTER — Encounter (HOSPITAL_COMMUNITY): Payer: Self-pay | Admitting: Internal Medicine

## 2023-11-21 ENCOUNTER — Observation Stay (HOSPITAL_COMMUNITY): Payer: MEDICAID

## 2023-11-21 ENCOUNTER — Observation Stay (HOSPITAL_COMMUNITY): Payer: MEDICAID | Admitting: Anesthesiology

## 2023-11-21 ENCOUNTER — Other Ambulatory Visit: Payer: Self-pay

## 2023-11-21 ENCOUNTER — Encounter (HOSPITAL_COMMUNITY)
Admission: EM | Disposition: A | Discharge status: Home / Self Care | Payer: Self-pay | Source: Home / Self Care | Attending: Family Medicine

## 2023-11-21 DIAGNOSIS — F419 Anxiety disorder, unspecified: Secondary | ICD-10-CM | POA: Diagnosis present

## 2023-11-21 DIAGNOSIS — T1490XA Injury, unspecified, initial encounter: Secondary | ICD-10-CM | POA: Diagnosis present

## 2023-11-21 DIAGNOSIS — F1210 Cannabis abuse, uncomplicated: Secondary | ICD-10-CM | POA: Diagnosis present

## 2023-11-21 DIAGNOSIS — Z888 Allergy status to other drugs, medicaments and biological substances status: Secondary | ICD-10-CM | POA: Diagnosis not present

## 2023-11-21 DIAGNOSIS — S32422A Displaced fracture of posterior wall of left acetabulum, initial encounter for closed fracture: Secondary | ICD-10-CM

## 2023-11-21 DIAGNOSIS — S52202A Unspecified fracture of shaft of left ulna, initial encounter for closed fracture: Secondary | ICD-10-CM

## 2023-11-21 DIAGNOSIS — Z6841 Body Mass Index (BMI) 40.0 and over, adult: Secondary | ICD-10-CM | POA: Diagnosis not present

## 2023-11-21 DIAGNOSIS — D72829 Elevated white blood cell count, unspecified: Secondary | ICD-10-CM | POA: Diagnosis present

## 2023-11-21 DIAGNOSIS — S82145A Nondisplaced bicondylar fracture of left tibia, initial encounter for closed fracture: Secondary | ICD-10-CM | POA: Diagnosis not present

## 2023-11-21 DIAGNOSIS — G8929 Other chronic pain: Secondary | ICD-10-CM | POA: Diagnosis present

## 2023-11-21 DIAGNOSIS — Z87442 Personal history of urinary calculi: Secondary | ICD-10-CM | POA: Diagnosis not present

## 2023-11-21 DIAGNOSIS — Z5948 Other specified lack of adequate food: Secondary | ICD-10-CM | POA: Diagnosis not present

## 2023-11-21 DIAGNOSIS — F17210 Nicotine dependence, cigarettes, uncomplicated: Secondary | ICD-10-CM | POA: Diagnosis present

## 2023-11-21 DIAGNOSIS — Y92410 Unspecified street and highway as the place of occurrence of the external cause: Secondary | ICD-10-CM | POA: Diagnosis not present

## 2023-11-21 DIAGNOSIS — S5292XA Unspecified fracture of left forearm, initial encounter for closed fracture: Secondary | ICD-10-CM

## 2023-11-21 DIAGNOSIS — S52302A Unspecified fracture of shaft of left radius, initial encounter for closed fracture: Secondary | ICD-10-CM | POA: Diagnosis present

## 2023-11-21 DIAGNOSIS — M549 Dorsalgia, unspecified: Secondary | ICD-10-CM | POA: Diagnosis present

## 2023-11-21 DIAGNOSIS — E66813 Obesity, class 3: Secondary | ICD-10-CM | POA: Diagnosis present

## 2023-11-21 DIAGNOSIS — S82142A Displaced bicondylar fracture of left tibia, initial encounter for closed fracture: Secondary | ICD-10-CM

## 2023-11-21 DIAGNOSIS — Z9049 Acquired absence of other specified parts of digestive tract: Secondary | ICD-10-CM | POA: Diagnosis not present

## 2023-11-21 DIAGNOSIS — R102 Pelvic and perineal pain: Secondary | ICD-10-CM | POA: Diagnosis present

## 2023-11-21 DIAGNOSIS — Z885 Allergy status to narcotic agent status: Secondary | ICD-10-CM | POA: Diagnosis not present

## 2023-11-21 DIAGNOSIS — Z886 Allergy status to analgesic agent status: Secondary | ICD-10-CM | POA: Diagnosis not present

## 2023-11-21 DIAGNOSIS — F1410 Cocaine abuse, uncomplicated: Secondary | ICD-10-CM | POA: Diagnosis present

## 2023-11-21 DIAGNOSIS — Z604 Social exclusion and rejection: Secondary | ICD-10-CM | POA: Diagnosis present

## 2023-11-21 DIAGNOSIS — F1110 Opioid abuse, uncomplicated: Secondary | ICD-10-CM | POA: Diagnosis present

## 2023-11-21 DIAGNOSIS — Z5941 Food insecurity: Secondary | ICD-10-CM | POA: Diagnosis not present

## 2023-11-21 HISTORY — PX: ORIF TIBIA PLATEAU: SHX2132

## 2023-11-21 HISTORY — PX: OPEN REDUCTION INTERNAL FIXATION (ORIF) HAND: SHX5991

## 2023-11-21 LAB — BASIC METABOLIC PANEL WITH GFR
Anion gap: 8 (ref 5–15)
BUN: 9 mg/dL (ref 6–20)
CO2: 20 mmol/L — ABNORMAL LOW (ref 22–32)
Calcium: 8.3 mg/dL — ABNORMAL LOW (ref 8.9–10.3)
Chloride: 103 mmol/L (ref 98–111)
Creatinine, Ser: 0.65 mg/dL (ref 0.44–1.00)
GFR, Estimated: >60 mL/min (ref >60)
Glucose, Bld: 146 mg/dL — ABNORMAL HIGH (ref 70–99)
Potassium: 3.7 mmol/L (ref 3.5–5.1)
Sodium: 131 mmol/L — ABNORMAL LOW (ref 135–145)

## 2023-11-21 LAB — CBC
HCT: 36.3 % (ref 36.0–46.0)
Hemoglobin: 12.6 g/dL (ref 12.0–15.0)
MCH: 33.3 pg (ref 26.0–34.0)
MCHC: 34.7 g/dL (ref 30.0–36.0)
MCV: 96 fL (ref 80.0–100.0)
Platelets: 183 10*3/uL (ref 150–400)
RBC: 3.78 MIL/uL — ABNORMAL LOW (ref 3.87–5.11)
RDW: 12.9 % (ref 11.5–15.5)
WBC: 11.4 10*3/uL — ABNORMAL HIGH (ref 4.0–10.5)
nRBC: 0 % (ref 0.0–0.2)

## 2023-11-21 LAB — SURGICAL PCR SCREEN
MRSA, PCR: NEGATIVE
Staphylococcus aureus: NEGATIVE

## 2023-11-21 SURGERY — OPEN REDUCTION INTERNAL FIXATION (ORIF) HAND
Anesthesia: General | Site: Hand | Laterality: Left

## 2023-11-21 MED ORDER — ALBUMIN HUMAN 5 % IV SOLN: INTRAVENOUS | Status: DC | PRN

## 2023-11-21 MED ORDER — CEFAZOLIN SODIUM-DEXTROSE 2-4 GM/100ML-% IV SOLN
INTRAVENOUS | Status: AC
  Filled 2023-11-21: qty 100

## 2023-11-21 MED ORDER — KETAMINE HCL 50 MG/5ML IJ SOSY
PREFILLED_SYRINGE | INTRAMUSCULAR | Status: AC
  Filled 2023-11-21: qty 5

## 2023-11-21 MED ORDER — SUGAMMADEX SODIUM 200 MG/2ML IV SOLN
INTRAVENOUS | Status: DC | PRN
  Administered 2023-11-21: 200 mg via INTRAVENOUS

## 2023-11-21 MED ORDER — AMISULPRIDE (ANTIEMETIC) 5 MG/2ML IV SOLN: 10.0000 mg | Freq: Once | INTRAVENOUS | Status: DC | PRN

## 2023-11-21 MED ORDER — FENTANYL CITRATE (PF) 250 MCG/5ML IJ SOLN
INTRAMUSCULAR | Status: AC
  Filled 2023-11-21: qty 5

## 2023-11-21 MED ORDER — CHLORHEXIDINE GLUCONATE 0.12 % MT SOLN: 15.0000 mL | Freq: Once | OROMUCOSAL | Status: AC

## 2023-11-21 MED ORDER — ACETAMINOPHEN 500 MG PO TABS: 1000.0000 mg | ORAL_TABLET | Freq: Once | ORAL | Status: DC

## 2023-11-21 MED ORDER — HYDROXYZINE HCL 25 MG PO TABS
25.0000 mg | ORAL_TABLET | Freq: Three times a day (TID) | ORAL | Status: DC | PRN
  Administered 2023-11-21 (×2): 25 mg via ORAL
  Filled 2023-11-21 (×2): qty 1

## 2023-11-21 MED ORDER — ONDANSETRON HCL 4 MG/2ML IJ SOLN
INTRAMUSCULAR | Status: DC | PRN
  Administered 2023-11-21: 4 mg via INTRAVENOUS

## 2023-11-21 MED ORDER — CHLORHEXIDINE GLUCONATE 0.12 % MT SOLN
OROMUCOSAL | Status: AC
  Administered 2023-11-21: 15 mL via OROMUCOSAL
  Filled 2023-11-21: qty 15

## 2023-11-21 MED ORDER — HYDROMORPHONE HCL 1 MG/ML IJ SOLN
INTRAMUSCULAR | Status: AC
  Filled 2023-11-21: qty 0.5

## 2023-11-21 MED ORDER — SCOPOLAMINE 1 MG/3DAYS TD PT72: 1.0000 | MEDICATED_PATCH | TRANSDERMAL | Status: DC

## 2023-11-21 MED ORDER — LIDOCAINE 2% (20 MG/ML) 5 ML SYRINGE
INTRAMUSCULAR | Status: AC
  Filled 2023-11-21: qty 5

## 2023-11-21 MED ORDER — METOCLOPRAMIDE HCL 5 MG/ML IJ SOLN
INTRAMUSCULAR | Status: DC | PRN
  Administered 2023-11-21: 10 mg via INTRAVENOUS

## 2023-11-21 MED ORDER — MIDAZOLAM HCL 2 MG/2ML IJ SOLN
INTRAMUSCULAR | Status: AC
  Filled 2023-11-21: qty 2

## 2023-11-21 MED ORDER — FENTANYL CITRATE (PF) 250 MCG/5ML IJ SOLN
INTRAMUSCULAR | Status: DC | PRN
  Administered 2023-11-21: 50 ug via INTRAVENOUS
  Administered 2023-11-21 (×2): 100 ug via INTRAVENOUS

## 2023-11-21 MED ORDER — OXYCODONE HCL 5 MG/5ML PO SOLN: 5.0000 mg | Freq: Once | ORAL | Status: DC | PRN

## 2023-11-21 MED ORDER — DEXMEDETOMIDINE HCL IN NACL 200 MCG/50ML IV SOLN
INTRAVENOUS | Status: DC | PRN
  Administered 2023-11-21 (×2): 8 ug via INTRAVENOUS

## 2023-11-21 MED ORDER — 0.9 % SODIUM CHLORIDE (POUR BTL) OPTIME
TOPICAL | Status: DC | PRN
  Administered 2023-11-21 (×2): 1000 mL

## 2023-11-21 MED ORDER — DEXAMETHASONE SODIUM PHOSPHATE 10 MG/ML IJ SOLN
INTRAMUSCULAR | Status: AC
Start: 2023-11-21 — End: ?
  Filled 2023-11-21: qty 1

## 2023-11-21 MED ORDER — PROPOFOL 10 MG/ML IV BOLUS
INTRAVENOUS | Status: DC | PRN
  Administered 2023-11-21: 200 mg via INTRAVENOUS

## 2023-11-21 MED ORDER — MUPIROCIN 2 % EX OINT
1.0000 | TOPICAL_OINTMENT | Freq: Two times a day (BID) | CUTANEOUS | Status: DC
Start: 2023-11-21 — End: 2023-11-26
  Administered 2023-11-21 – 2023-11-22 (×2): 1 via NASAL
  Filled 2023-11-21: qty 22

## 2023-11-21 MED ORDER — ROCURONIUM BROMIDE 10 MG/ML (PF) SYRINGE
PREFILLED_SYRINGE | INTRAVENOUS | Status: DC | PRN
  Administered 2023-11-21 (×2): 50 mg via INTRAVENOUS

## 2023-11-21 MED ORDER — CEFAZOLIN SODIUM-DEXTROSE 2-4 GM/100ML-% IV SOLN
2.0000 g | Freq: Three times a day (TID) | INTRAVENOUS | Status: AC
  Administered 2023-11-21 (×2): 2 g via INTRAVENOUS
  Filled 2023-11-21 (×2): qty 100

## 2023-11-21 MED ORDER — PROPOFOL 10 MG/ML IV BOLUS
INTRAVENOUS | Status: AC
Start: 2023-11-21 — End: ?
  Filled 2023-11-21: qty 20

## 2023-11-21 MED ORDER — SCOPOLAMINE 1 MG/3DAYS TD PT72
MEDICATED_PATCH | TRANSDERMAL | Status: DC
Start: 2023-11-21 — End: 2023-11-24
  Administered 2023-11-21: 1.5 mg via TRANSDERMAL
  Filled 2023-11-21: qty 1

## 2023-11-21 MED ORDER — TRANEXAMIC ACID-NACL 1000-0.7 MG/100ML-% IV SOLN
INTRAVENOUS | Status: AC
  Filled 2023-11-21: qty 100

## 2023-11-21 MED ORDER — ONDANSETRON HCL 4 MG/2ML IJ SOLN
INTRAMUSCULAR | Status: AC
  Filled 2023-11-21: qty 2

## 2023-11-21 MED ORDER — HYDROMORPHONE HCL 1 MG/ML IJ SOLN
INTRAMUSCULAR | Status: DC | PRN
  Administered 2023-11-21 (×3): .5 mg via INTRAVENOUS

## 2023-11-21 MED ORDER — DEXMEDETOMIDINE HCL IN NACL 80 MCG/20ML IV SOLN
INTRAVENOUS | Status: AC
  Filled 2023-11-21: qty 20

## 2023-11-21 MED ORDER — SODIUM CHLORIDE 0.9 % IV SOLN: 12.5000 mg | INTRAVENOUS | Status: DC | PRN

## 2023-11-21 MED ORDER — HYDROMORPHONE HCL 1 MG/ML IJ SOLN
INTRAMUSCULAR | Status: AC
Start: 2023-11-21 — End: ?
  Filled 2023-11-21: qty 0.5

## 2023-11-21 MED ORDER — MIDAZOLAM HCL 2 MG/2ML IJ SOLN
INTRAMUSCULAR | Status: DC | PRN
  Administered 2023-11-21: 2 mg via INTRAVENOUS

## 2023-11-21 MED ORDER — OXYCODONE HCL 5 MG PO TABS
5.0000 mg | ORAL_TABLET | Freq: Once | ORAL | Status: DC | PRN
Start: 2023-11-21 — End: 2023-11-21

## 2023-11-21 MED ORDER — ROCURONIUM BROMIDE 10 MG/ML (PF) SYRINGE
PREFILLED_SYRINGE | INTRAVENOUS | Status: AC
  Filled 2023-11-21: qty 10

## 2023-11-21 MED ORDER — LACTATED RINGERS IV SOLN: INTRAVENOUS | Status: DC

## 2023-11-21 MED ORDER — PHENYLEPHRINE HCL-NACL 20-0.9 MG/250ML-% IV SOLN
INTRAVENOUS | Status: DC | PRN
  Administered 2023-11-21: 50 ug/min via INTRAVENOUS

## 2023-11-21 MED ORDER — FENTANYL CITRATE (PF) 100 MCG/2ML IJ SOLN: 25.0000 ug | INTRAMUSCULAR | Status: DC | PRN

## 2023-11-21 MED ORDER — KETAMINE HCL 50 MG/5ML IJ SOSY
PREFILLED_SYRINGE | INTRAMUSCULAR | Status: DC | PRN
  Administered 2023-11-21 (×2): 25 mg via INTRAVENOUS

## 2023-11-21 MED ORDER — ORAL CARE MOUTH RINSE: 15.0000 mL | Freq: Once | OROMUCOSAL | Status: AC

## 2023-11-21 MED ORDER — DEXAMETHASONE SODIUM PHOSPHATE 10 MG/ML IJ SOLN
INTRAMUSCULAR | Status: DC | PRN
  Administered 2023-11-21: 10 mg via INTRAVENOUS

## 2023-11-21 MED ORDER — LIDOCAINE 2% (20 MG/ML) 5 ML SYRINGE
INTRAMUSCULAR | Status: DC | PRN
  Administered 2023-11-21: 50 mg via INTRAVENOUS

## 2023-11-21 SURGICAL SUPPLY — 102 items
BAG COUNTER SPONGE SURGICOUNT (BAG) ×4 IMPLANT
BANDAGE ESMARK 6X9 LF (GAUZE/BANDAGES/DRESSINGS) ×2 IMPLANT
BIT DRILL 100X2.5XANTM LCK (BIT) IMPLANT
BIT DRILL 2.0X130 (DRILL) IMPLANT
BIT DRILL 2.3 120MM (DRILL) IMPLANT
BIT DRILL 2.5X2.75 QC CALB (BIT) IMPLANT
BIT DRL 100X2.5XANTM LCK (BIT) ×2 IMPLANT
BLADE CLIPPER SURG (BLADE) IMPLANT
BLADE SURG 15 STRL LF DISP TIS (BLADE) ×2 IMPLANT
BNDG ELASTIC 3INX 5YD STR LF (GAUZE/BANDAGES/DRESSINGS) ×6 IMPLANT
BNDG ELASTIC 4INX 5YD STR LF (GAUZE/BANDAGES/DRESSINGS) IMPLANT
BNDG ELASTIC 4X5.8 VLCR STR LF (GAUZE/BANDAGES/DRESSINGS) ×4 IMPLANT
BNDG ELASTIC 6INX 5YD STR LF (GAUZE/BANDAGES/DRESSINGS) ×2 IMPLANT
BNDG ESMARK 4X9 LF (GAUZE/BANDAGES/DRESSINGS) ×2 IMPLANT
BNDG ESMARK 6X9 LF (GAUZE/BANDAGES/DRESSINGS) ×2 IMPLANT
BNDG GAUZE DERMACEA FLUFF 4 (GAUZE/BANDAGES/DRESSINGS) ×8 IMPLANT
BRUSH SCRUB EZ PLAIN DRY (MISCELLANEOUS) ×4 IMPLANT
CANISTER SUCT 3000ML PPV (MISCELLANEOUS) ×2 IMPLANT
CHLORAPREP W/TINT 26 (MISCELLANEOUS) ×4 IMPLANT
CORD BIPOLAR FORCEPS 12FT (ELECTRODE) ×2 IMPLANT
COVER SURGICAL LIGHT HANDLE (MISCELLANEOUS) ×4 IMPLANT
CUFF TOURN SGL QUICK 18X4 (TOURNIQUET CUFF) ×2 IMPLANT
CUFF TRNQT CYL 24X4X16.5-23 (TOURNIQUET CUFF) IMPLANT
CUFF TRNQT CYL 34X4.125X (TOURNIQUET CUFF) ×2 IMPLANT
DRAIN TLS ROUND 10FR (DRAIN) IMPLANT
DRAPE C-ARM 42X72 X-RAY (DRAPES) ×2 IMPLANT
DRAPE C-ARMOR (DRAPES) ×2 IMPLANT
DRAPE OEC MINIVIEW 54X84 (DRAPES) IMPLANT
DRAPE SURG ORHT 6 SPLT 77X108 (DRAPES) ×4 IMPLANT
DRAPE U-SHAPE 47X51 STRL (DRAPES) ×4 IMPLANT
DRILL 2.0X130 (DRILL) ×2 IMPLANT
DRILL 2.3 120MM AKA DRILL 2.3L (DRILL) ×2 IMPLANT
DRSG ADAPTIC 3X8 NADH LF (GAUZE/BANDAGES/DRESSINGS) ×2 IMPLANT
DRSG AQUACEL AG ADV 3.5X 6 (GAUZE/BANDAGES/DRESSINGS) IMPLANT
ELECT REM PT RETURN 9FT ADLT (ELECTROSURGICAL) ×2 IMPLANT
ELECTRODE REM PT RTRN 9FT ADLT (ELECTROSURGICAL) ×2 IMPLANT
GAUZE PAD ABD 8X10 STRL (GAUZE/BANDAGES/DRESSINGS) ×4 IMPLANT
GAUZE SPONGE 4X4 12PLY STRL (GAUZE/BANDAGES/DRESSINGS) ×4 IMPLANT
GAUZE XEROFORM 5X9 LF (GAUZE/BANDAGES/DRESSINGS) ×2 IMPLANT
GLOVE BIO SURGEON STRL SZ 6.5 (GLOVE) ×6 IMPLANT
GLOVE BIO SURGEON STRL SZ7 (GLOVE) ×2 IMPLANT
GLOVE BIO SURGEON STRL SZ7.5 (GLOVE) ×8 IMPLANT
GLOVE BIOGEL PI IND STRL 6.5 (GLOVE) ×2 IMPLANT
GLOVE BIOGEL PI IND STRL 7.0 (GLOVE) ×2 IMPLANT
GLOVE BIOGEL PI IND STRL 7.5 (GLOVE) ×2 IMPLANT
GOWN STRL REUS W/ TWL LRG LVL3 (GOWN DISPOSABLE) ×6 IMPLANT
IMMOBILIZER KNEE 22 UNIV (SOFTGOODS) ×2 IMPLANT
K-WIRE 1.6X65 (WIRE) ×2 IMPLANT
K-WIRE ACE 1.6X6 (WIRE) ×4 IMPLANT
KIT BASIN OR (CUSTOM PROCEDURE TRAY) ×4 IMPLANT
KIT TURNOVER KIT B (KITS) ×4 IMPLANT
KWIRE 1.6X65 (WIRE) IMPLANT
KWIRE ACE 1.6X6 (WIRE) IMPLANT
MANIFOLD NEPTUNE II (INSTRUMENTS) ×2 IMPLANT
NDL 22X1.5 STRL (OR ONLY) (MISCELLANEOUS) IMPLANT
NDL SUT 6 .5 CRC .975X.05 MAYO (NEEDLE) ×2 IMPLANT
NEEDLE 22X1.5 STRL (OR ONLY) (MISCELLANEOUS) IMPLANT
NS IRRIG 1000ML POUR BTL (IV SOLUTION) ×4 IMPLANT
PACK ORTHO EXTREMITY (CUSTOM PROCEDURE TRAY) ×2 IMPLANT
PACK TOTAL JOINT (CUSTOM PROCEDURE TRAY) ×2 IMPLANT
PAD ARMBOARD POSITIONER FOAM (MISCELLANEOUS) ×8 IMPLANT
PAD CAST 4YDX4 CTTN HI CHSV (CAST SUPPLIES) ×8 IMPLANT
PADDING CAST COTTON 6X4 STRL (CAST SUPPLIES) ×2 IMPLANT
PLATE LOCK 3H STD LT PROX TIB (Plate) IMPLANT
PLATE LOCK CURVED 8H NS (Plate) IMPLANT
PLATE LOCK STRT 10H NS (Plate) IMPLANT
SCREW CORT THRD HEAD 3.2X10 (Screw) IMPLANT
SCREW HEX 3.2X12 (Screw) IMPLANT
SCREW HEX 3.2X14 (Screw) IMPLANT
SCREW LOCK CORT STAR 3.5X36 (Screw) IMPLANT
SCREW LOCK CORT STAR 3.5X40 (Screw) IMPLANT
SCREW LOCK CORT STAR 3.5X50 (Screw) IMPLANT
SCREW LP 3.5X65MM (Screw) IMPLANT
SCREW LP 3.5X70MM (Screw) IMPLANT
SCREW LP 3.5X75MM (Screw) IMPLANT
SOL PREP POV-IOD 4OZ 10% (MISCELLANEOUS) ×4 IMPLANT
SPIKE FLUID TRANSFER (MISCELLANEOUS) IMPLANT
SPLINT FIBERGLASS 4X30 (CAST SUPPLIES) IMPLANT
SPONGE T-LAP 4X18 ~~LOC~~+RFID (SPONGE) IMPLANT
STAPLER VISISTAT 35W (STAPLE) ×2 IMPLANT
SUCTION TUBE FRAZIER 10FR DISP (SUCTIONS) ×2 IMPLANT
SUT ETHILON 2 0 FS 18 (SUTURE) ×2 IMPLANT
SUT ETHILON 3 0 PS 1 (SUTURE) IMPLANT
SUT FIBERWIRE #2 38 T-5 BLUE (SUTURE) IMPLANT
SUT MNCRL AB 4-0 PS2 18 (SUTURE) ×2 IMPLANT
SUT PROLENE 3 0 PS 2 (SUTURE) IMPLANT
SUT PROLENE 4 0 PS 2 18 (SUTURE) IMPLANT
SUT VIC AB 0 CT1 27XBRD ANBCTR (SUTURE) IMPLANT
SUT VIC AB 1 CT1 18XCR BRD 8 (SUTURE) IMPLANT
SUT VIC AB 1 CT1 27XBRD ANBCTR (SUTURE) ×2 IMPLANT
SUT VIC AB 2-0 CT1 TAPERPNT 27 (SUTURE) ×4 IMPLANT
SUT VIC AB 3-0 FS2 27 (SUTURE) IMPLANT
SUTURE FIBERWR #2 38 T-5 BLUE (SUTURE) IMPLANT
SYR CONTROL 10ML LL (SYRINGE) IMPLANT
SYSTEM CHEST DRAIN TLS 7FR (DRAIN) IMPLANT
TOWEL GREEN STERILE (TOWEL DISPOSABLE) ×6 IMPLANT
TOWEL GREEN STERILE FF (TOWEL DISPOSABLE) ×2 IMPLANT
TRAY FOLEY MTR SLVR 16FR STAT (SET/KITS/TRAYS/PACK) IMPLANT
TUBE CONNECTING 12X1/4 (SUCTIONS) ×2 IMPLANT
TUBE EVACUATION TLS (MISCELLANEOUS) ×2 IMPLANT
UNDERPAD 30X36 HEAVY ABSORB (UNDERPADS AND DIAPERS) ×2 IMPLANT
WATER STERILE IRR 1000ML POUR (IV SOLUTION) ×6 IMPLANT

## 2023-11-21 NOTE — Op Note (Signed)
 Date of Surgery: 11/21/2023  INDICATIONS: Valerie Robinson is a 38 y.o.-year-old female with left minimally displaced tibial plateau fracture.  The risk and benefits of the procedure were discussed in detail and documented in the pre-operative evaluation.   PREOPERATIVE DIAGNOSIS: 1.  Left lateral tibial plateau fracture 2.  Place posterior wall acetabular fracture  POSTOPERATIVE DIAGNOSIS: Same.  PROCEDURE: 1.  Reduction internal fixation left tibial plateau fracture 2.  His management left posterior wall acetabular fracture  SURGEON: Benancio Deeds MD  ASSISTANT: Valerie Beards, MD  ANESTHESIA:  general  IV FLUIDS AND URINE: See anesthesia record.  ANTIBIOTICS: Ancef  ESTIMATED BLOOD LOSS: 10 mL.  IMPLANTS:  Implant Name Type Inv. Item Serial No. Manufacturer Lot No. LRB No. Used Action  SCREW LOCK CORT STAR 3.5X36 - [id number] Screw SCREW LOCK CORT STAR 3.5X36  [employer](ORTH,TRAU,BIO,SG)  Left 1 Implanted  SCREW LOCK CORT STAR 3.5X40 - [id number] Screw SCREW LOCK CORT STAR 3.5X40  [employer](ORTH,TRAU,BIO,SG)  Left 1 Implanted  SCREW LP 3.5X75MM - [id number] Screw SCREW LP 3.5X75MM  [employer](ORTH,TRAU,BIO,SG)  Left 1 Implanted  SCREW LP 3.5X65MM - [id number] Screw SCREW LP 3.5X65MM  [employer](ORTH,TRAU,BIO,SG)  Left 1 Implanted  SCREW LP 3.5X70MM - [id number] Screw SCREW LP 3.5X70MM  [employer](ORTH,TRAU,BIO,SG)  Left 1 Implanted  SCREW LOCK CORT STAR 3.5X50 - [id number] Screw SCREW LOCK CORT STAR 3.5X50  [employer](ORTH,TRAU,BIO,SG)  Left 1 Implanted  PLATE LOCK 3H STD LT PROX TIB - [id number] Plate PLATE LOCK 3H STD LT PROX TIB  [employer](ORTH,TRAU,BIO,SG)  Left 1 Implanted  3.2 CORTICAL SCREW, LENGTH    TRIMED  Left 4 Implanted  3.2 CORTICAL SCREW, LENGTH    TRIMED  Left 8 Implanted  FOREARM PLATE, CURVED, 8 HOLE    TRIMED  Left 1 Implanted  FOREARM PLATE, STRAIGHT, 10    TRIMED  Left 1 Implanted  3.2 CORTICAL SCREW, LENGTH    TRIMED  Left  2 Implanted    DRAINS: None  CULTURES: None  COMPLICATIONS: none  DESCRIPTION OF PROCEDURE:   Patient was identified in the preoperative holding area.  Site was marked according universal protocol she was subsequently taken back to the operating room.  Antibiotics were given 1 hour prior to skin incision.  Anesthesia was induced.  She was transitioned over to the OR table.  She was placed with the arm on an arm table and the left lower extremity on a bone foam.  The left forearm procedure will be dictated separately by Dr. Frazier Butt  The left lower extremity was prepped and draped in the usual sterile fashion without tourniquet.  Final timeout was performed.  I began with an anterior lateral approach to the tibial plateau.  15 blade was used to incise through skin.  Electrocautery was used to achieve hemostasis.  Cautery was used to go through the IT band and come up and anterior laterally through Gertie's tubercle and take a sleeve off of the tibial crest with care to leave tissue for remaining closure.  At this time the anterior lateral musculature and anterior compartment musculature was elevated off using a Freer.  The fracture site was identified.  This was clamped into place.  A proximal tibial anterior lateral plate was selected and provisionally pinned using 2 K wires.  The distal buttress screws were then placed.  This had excellent apposition with anatomic restoration of the condylar width.  Remaining 3 proximal screws were placed and 1 final distal screw was placed.  The wound was thoroughly irrigated and closed in layers of 0 Vicryl, 2-0 Vicryl, 3-0 nylon.  Symptoms were applied with a Aquacel all counts were correct at the end of the case.  She is taken the PACU without complication.  She will be placed in a knee immobilizer  Of note Dr. Waylan Rocher was required due to the complex of the of the case and due to the lack of a Sales promotion account executive.  He was required specifically for  placement and condylar reduction.  Regard to her left posterior wall acetabular fracture she will need pain weightbearing and I will plan to follow this with serial radiographs to assess her over time   POSTOPERATIVE PLAN: She may be weightbearing as tolerated on the left leg in knee immobilizer.  I will plan to see her back in 2 weeks for suture removal  Benancio Deeds, MD 10:07 AM

## 2023-11-21 NOTE — Anesthesia Procedure Notes (Signed)
 Procedure Name: Intubation Date/Time: 11/21/2023 7:55 AM  Performed by: Gloris Ham, CRNAPre-anesthesia Checklist: Patient identified, Emergency Drugs available, Suction available and Patient being monitored Patient Re-evaluated:Patient Re-evaluated prior to induction Oxygen Delivery Method: Circle System Utilized Preoxygenation: Pre-oxygenation with 100% oxygen Induction Type: IV induction Ventilation: Mask ventilation without difficulty Grade View: Grade I Tube type: Oral Tube size: 7.5 mm Number of attempts: 1 Airway Equipment and Method: Stylet and Oral airway Placement Confirmation: ETT inserted through vocal cords under direct vision, positive ETCO2 and breath sounds checked- equal and bilateral Secured at: 20 cm Tube secured with: Tape Dental Injury: Teeth and Oropharynx as per pre-operative assessment
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
## 2023-11-21 NOTE — Interval H&P Note (Signed)
 History and Physical Interval Note:  11/21/2023 7:35 AM  Valerie Robinson  has presented today for surgery, with the diagnosis of Open L eft Forearm Fx Left Tibial Fx.  The various methods of treatment have been discussed with the patient and family. After consideration of risks, benefits and other options for treatment, the patient has consented to  Procedure(s) with comments: OPEN REDUCTION INTERNAL FIXATION (ORIF) HAND (Left) - LEFT FOREARM OPEN REDUCTION INTERNAL FIXATION (ORIF) TIBIAL PLATEAU (Left) as a surgical intervention.  The patient's history has been reviewed, patient examined, no change in status, stable for surgery.  I have reviewed the patient's chart and labs.  Questions were answered to the patient's satisfaction.     [name]

## 2023-11-21 NOTE — Transfer of Care (Signed)
 Immediate Anesthesia Transfer of Care Note  Patient: Valerie Robinson  Procedure(s) Performed: OPEN REDUCTION INTERNAL FIXATION (ORIF) HAND (Left: Hand) OPEN REDUCTION INTERNAL FIXATION (ORIF) TIBIAL PLATEAU (Left)  Patient Location: PACU  Anesthesia Type:General  Level of Consciousness: awake, alert , and oriented  Airway & Oxygen Therapy: Patient Spontanous Breathing and Patient connected to nasal cannula oxygen  Post-op Assessment: Report given to RN, Post -op Vital signs reviewed and stable, and Patient moving all extremities X 4  Post vital signs: Reviewed and stable  Last Vitals:  Vitals Value Taken Time  BP 122/80 11/21/23 1037  Temp 97.8   Pulse 85 11/21/23 1040  Resp 10 11/21/23 1040  SpO2 97 % 11/21/23 1040  Vitals shown include unfiled device data.  Last Pain:  Vitals:   11/21/23 0659  TempSrc: Oral  PainSc:          Complications: There were no known notable events for this encounter.

## 2023-11-21 NOTE — Plan of Care (Signed)
  Problem: Clinical Measurements: Goal: Respiratory complications will improve Outcome: Progressing   Problem: Activity: Goal: Risk for activity intolerance will decrease Outcome: Progressing   Problem: Nutrition: Goal: Adequate nutrition will be maintained Outcome: Progressing   Problem: Coping: Goal: Level of anxiety will decrease Outcome: Not Progressing   Problem: Pain Managment: Goal: General experience of comfort will improve and/or be controlled Outcome: Not Progressing

## 2023-11-21 NOTE — Brief Op Note (Signed)
   Brief Op Note  Date of Surgery: 11/21/2023  Preoperative Diagnosis: Open L eft Forearm Fracture Left Tibial Fracture  Postoperative Diagnosis: same  Procedure: Procedure(s): OPEN REDUCTION INTERNAL FIXATION (ORIF) HAND OPEN REDUCTION INTERNAL FIXATION (ORIF) TIBIAL PLATEAU  Implants: Implant Name Type Inv. Item Serial No. Manufacturer Lot No. LRB No. Used Action  SCREW LOCK CORT STAR 3.5X36 - [id number] Screw SCREW LOCK CORT STAR 3.5X36  [employer](ORTH,TRAU,BIO,SG)  Left 1 Implanted  SCREW LOCK CORT STAR 3.5X40 - [id number] Screw SCREW LOCK CORT STAR 3.5X40  [employer](ORTH,TRAU,BIO,SG)  Left 1 Implanted  SCREW LP 3.5X75MM - [id number] Screw SCREW LP 3.5X75MM  [employer](ORTH,TRAU,BIO,SG)  Left 1 Implanted  SCREW LP 3.5X65MM - [id number] Screw SCREW LP 3.5X65MM  [employer](ORTH,TRAU,BIO,SG)  Left 1 Implanted  SCREW LP 3.5X70MM - [id number] Screw SCREW LP 3.5X70MM  [employer](ORTH,TRAU,BIO,SG)  Left 1 Implanted  SCREW LOCK CORT STAR 3.5X50 - [id number] Screw SCREW LOCK CORT STAR 3.5X50  [employer](ORTH,TRAU,BIO,SG)  Left 1 Implanted  PLATE LOCK 3H STD LT PROX TIB - [id number] Plate PLATE LOCK 3H STD LT PROX TIB  [employer](ORTH,TRAU,BIO,SG)  Left 1 Implanted  3.2 CORTICAL SCREW, LENGTH    TRIMED  Left 4 Implanted  3.2 CORTICAL SCREW, LENGTH    TRIMED  Left 8 Implanted  FOREARM PLATE, CURVED, 8 HOLE    TRIMED  Left 1 Implanted  FOREARM PLATE, STRAIGHT, 10    TRIMED  Left 1 Implanted  3.2 CORTICAL SCREW, LENGTH    TRIMED  Left 2 Implanted    Surgeons: Surgeon(s): Marlyne Beards, MD [name], MD  Anesthesia: General    Estimated Blood Loss: See anesthesia record  Complications: None  Condition to PACU: Stable  Benancio Deeds, MD 11/21/2023 10:07 AM
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
## 2023-11-21 NOTE — Interval H&P Note (Signed)
 History and Physical Interval Note:  11/21/2023 7:42 AM  Valerie Robinson  has presented today for surgery, with the diagnosis of Open L eft Forearm Fx Left Tibial Fx.  The various methods of treatment have been discussed with the patient and family. After consideration of risks, benefits and other options for treatment, the patient has consented to  Procedure(s) with comments: OPEN REDUCTION INTERNAL FIXATION (ORIF) HAND (Left) - LEFT FOREARM OPEN REDUCTION INTERNAL FIXATION (ORIF) TIBIAL PLATEAU (Left) as a surgical intervention.  The patient's history has been reviewed, patient examined, no change in status, stable for surgery.  I have reviewed the patient's chart and labs.  Questions were answered to the patient's satisfaction.     Ivis Henneman

## 2023-11-21 NOTE — Progress Notes (Signed)
 Patient arrived to 6N26 from PACU. Alert and oriented x 4, complaints of 9/10 pain located on left knee and left arm. Pain medication administered. Bed in lowest position, call light within reach.

## 2023-11-21 NOTE — Anesthesia Postprocedure Evaluation (Signed)
 Anesthesia Post Note  Patient: Valerie Robinson  Procedure(s) Performed: OPEN REDUCTION INTERNAL FIXATION (ORIF) HAND (Left: Hand) OPEN REDUCTION INTERNAL FIXATION (ORIF) TIBIAL PLATEAU (Left)     Patient location during evaluation: PACU Anesthesia Type: General Level of consciousness: awake and alert Pain management: pain level controlled Vital Signs Assessment: post-procedure vital signs reviewed and stable Respiratory status: spontaneous breathing, nonlabored ventilation and respiratory function stable Cardiovascular status: stable and blood pressure returned to baseline Anesthetic complications: no  There were no known notable events for this encounter.  Last Vitals:  Vitals:   11/21/23 1107 11/21/23 1119  BP: 123/78 (!) 140/69  Pulse: 76 75  Resp: 19 20  Temp: 37.2 C 37.4 C  SpO2: 93% 95%                Beryle Lathe

## 2023-11-21 NOTE — Plan of Care (Signed)
  Problem: Education: Goal: Knowledge of General Education information will improve Description: Including pain rating scale, medication(s)/side effects and non-pharmacologic comfort measures Outcome: Progressing   Problem: Clinical Measurements: Goal: Will remain free from infection Outcome: Progressing   Problem: Clinical Measurements: Goal: Cardiovascular complication will be avoided Outcome: Progressing   Problem: Activity: Goal: Risk for activity intolerance will decrease Outcome: Progressing   Problem: Pain Managment: Goal: General experience of comfort will improve and/or be controlled Outcome: Progressing   Problem: Elimination: Goal: Will not experience complications related to urinary retention Outcome: Progressing   Problem: Elimination: Goal: Will not experience complications related to bowel motility Outcome: Progressing

## 2023-11-22 ENCOUNTER — Encounter (HOSPITAL_COMMUNITY): Payer: Self-pay | Admitting: Orthopedic Surgery

## 2023-11-22 DIAGNOSIS — T1490XA Injury, unspecified, initial encounter: Secondary | ICD-10-CM | POA: Diagnosis not present

## 2023-11-22 LAB — BASIC METABOLIC PANEL WITH GFR
Anion gap: 8 (ref 5–15)
BUN: 8 mg/dL (ref 6–20)
CO2: 21 mmol/L — ABNORMAL LOW (ref 22–32)
Calcium: 8.5 mg/dL — ABNORMAL LOW (ref 8.9–10.3)
Chloride: 105 mmol/L (ref 98–111)
Creatinine, Ser: 0.68 mg/dL (ref 0.44–1.00)
GFR, Estimated: >60 mL/min (ref >60)
Glucose, Bld: 109 mg/dL — ABNORMAL HIGH (ref 70–99)
Potassium: 3.4 mmol/L — ABNORMAL LOW (ref 3.5–5.1)
Sodium: 134 mmol/L — ABNORMAL LOW (ref 135–145)

## 2023-11-22 LAB — CBC
HCT: 31.6 % — ABNORMAL LOW (ref 36.0–46.0)
Hemoglobin: 10.8 g/dL — ABNORMAL LOW (ref 12.0–15.0)
MCH: 32.8 pg (ref 26.0–34.0)
MCHC: 34.2 g/dL (ref 30.0–36.0)
MCV: 96 fL (ref 80.0–100.0)
Platelets: 190 10*3/uL (ref 150–400)
RBC: 3.29 MIL/uL — ABNORMAL LOW (ref 3.87–5.11)
RDW: 12.7 % (ref 11.5–15.5)
WBC: 11.1 10*3/uL — ABNORMAL HIGH (ref 4.0–10.5)
nRBC: 0 % (ref 0.0–0.2)

## 2023-11-22 MED ORDER — ASPIRIN 325 MG PO TBEC: 325.0000 mg | DELAYED_RELEASE_TABLET | Freq: Every day | ORAL | 0 refills | Status: AC
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
## 2023-11-22 NOTE — Progress Notes (Signed)
 Reviewed AVS, patient expressed understanding of medications, MD follow up reviewed.   Removed IV, Site clean, dry and intact.  Patient states all belongings brought to the hospital at time of admission are accounted for and packed to take home.  Pt transported to Discharge lounge to wait for transportation home.

## 2023-11-22 NOTE — Progress Notes (Signed)
 Mobility Specialist Progress Note:   11/22/23 1352  Mobility  Activity Ambulated with assistance in hallway  Level of Assistance Contact guard assist, steadying assist  Assistive Device None  Distance Ambulated (ft) 175 ft  LUE Weight Bearing Per Provider Order NWB  LLE Weight Bearing Per Provider Order WBAT  Activity Response Tolerated well  Mobility Referral Yes  Mobility visit 1 Mobility  Mobility Specialist Start Time (ACUTE ONLY) 1015  Mobility Specialist Stop Time (ACUTE ONLY) 1023  Mobility Specialist Time Calculation (min) (ACUTE ONLY) 8 min   Pt received in bed, agreeable to mobility. Pt c/o RUE pain during ambulation, otherwise asx throughout. Pt left in chair with call bell in reach and all needs met.  Leory Plowman  Mobility Specialist Please contact via [employer] office at [phone]
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
## 2023-11-22 NOTE — Progress Notes (Signed)
   Subjective:  Patient reports pain as mild.  Was able to ambulate with walker.  Was able to go to the bathroom and is tolerating diet  Objective:   VITALS:   Vitals:   11/21/23 1522 11/21/23 2105 11/22/23 0017 11/22/23 0456  BP: 120/70 120/62 125/63 115/64  Pulse: 86 (!) 51 (!) 50 (!) 49  Resp: 19 20 18 18   Temp: 98.9 F (37.2 C) 98.8 F (37.1 C) 99.4 F (37.4 C) 98.5 F (36.9 C)  TempSrc: Oral Oral Oral Oral  SpO2: 98% 99% 100% 98%  Weight:      Height:       Left lower extremity dressing is clean dry and intact.  Fires tibialis anterior gastrosoleus with sensation intact all distributions.  Strong 2+ dorsalis pedis pulse  Lab Results  Component Value Date   WBC 11.4 (H) 11/21/2023   HGB 12.6 11/21/2023   HCT 36.3 11/21/2023   MCV 96.0 11/21/2023   PLT 183 11/21/2023     Assessment/Plan:  1 Day Post-Op left tibial plateau open reduction internal fixation overall doing well.  - Patient to work with PT to optimize mobilization safely - DVT ppx - SCDs, ambulation, aspirin 325 for 4 weeks on discharge please - WBAT operative extremity - Pain control - multimodal pain management, ATC acetaminophen in conjunction with as needed narcotic (oxycodone), although this should be minimized with other modalities  - Discharge planning pending CM, appreciate coordination    Adrianne Shackleton 11/22/2023, 7:56 AM

## 2023-11-22 NOTE — Plan of Care (Signed)
  Problem: Education: Goal: Knowledge of General Education information will improve Description: Including pain rating scale, medication(s)/side effects and non-pharmacologic comfort measures Outcome: Progressing   Problem: Health Behavior/Discharge Planning: Goal: Ability to manage health-related needs will improve Outcome: Progressing   Problem: Clinical Measurements: Goal: Respiratory complications will improve Outcome: Progressing   Problem: Clinical Measurements: Goal: Cardiovascular complication will be avoided Outcome: Progressing   Problem: Coping: Goal: Level of anxiety will decrease Outcome: Progressing   Problem: Elimination: Goal: Will not experience complications related to bowel motility Outcome: Progressing   Problem: Elimination: Goal: Will not experience complications related to urinary retention Outcome: Progressing   Problem: Safety: Goal: Ability to remain free from injury will improve Outcome: Progressing   Problem: Pain Managment: Goal: General experience of comfort will improve and/or be controlled Outcome: Progressing

## 2023-11-22 NOTE — TOC Transition Note (Signed)
 Transition of Care [facility]) - Discharge Note   Patient Details  Name: Valerie Robinson MRN: [id number] Date of Birth: 16-Jan-1986  Transition of Care [facility]) CM/SW Contact:  Tom-Johnson, Hershal Coria, RN Phone Number: 11/22/2023, 2:43 PM   Clinical Narrative:     Patient is scheduled for discharge today.  Readmission Risk Assessment done. Outpatient f/u, hospital f/u and discharge instructions on AVS. No TOC needs or recommendations noted. Family to transport at discharge.  No further TOC needs noted.        Final next level of care: Home/Self Care Barriers to Discharge: Barriers Resolved   Patient Goals and CMS Choice Patient states their goals for this hospitalization and ongoing recovery are:: To return home CMS Medicare.gov Compare Post Acute Care list provided to:: Patient Choice offered to / list presented to : NA      Discharge Placement                Patient to be transferred to facility by: Family      Discharge Plan and Services Additional resources added to the After Visit Summary for                  DME Arranged: N/A DME Agency: NA       HH Arranged: NA HH Agency: NA        Social Drivers of Health (SDOH) Interventions SDOH Screenings   Food Insecurity: Food Insecurity Present (11/20/2023)  Housing: Low Risk  (11/20/2023)  Transportation Needs: No Transportation Needs (11/20/2023)  Utilities: Not At Risk (11/20/2023)  Social Connections: Socially Isolated (11/20/2023)  Tobacco Use: High Risk (11/21/2023)     Readmission Risk Interventions    11/22/2023    2:41 PM  Readmission Risk Prevention Plan  Transportation Screening Complete  PCP or Specialist Appt within 5-7 Days Complete  Home Care Screening Complete  Medication Review (RN CM) Referral to Pharmacy

## 2023-11-22 NOTE — Progress Notes (Signed)
 Orthopedic Tech Progress Note Patient Details:  Valerie Robinson July 02, 1986 [id number]  Ortho Devices Type of Ortho Device: Arm sling Ortho Device/Splint Location: LUE Ortho Device/Splint Interventions: Adjustment, Ordered, Application   Post Interventions Patient Tolerated: Well Instructions Provided: Poper ambulation with device  Girard Koontz A Delynn Olvera 11/22/2023, 2:40 PM

## 2023-11-23 ENCOUNTER — Other Ambulatory Visit: Payer: Self-pay | Admitting: Orthopaedic Surgery

## 2023-11-23 ENCOUNTER — Telehealth (HOSPITAL_BASED_OUTPATIENT_CLINIC_OR_DEPARTMENT_OTHER): Payer: Self-pay | Admitting: Orthopaedic Surgery

## 2023-11-23 MED ORDER — OXYCODONE HCL 5 MG PO TABS
5.0000 mg | ORAL_TABLET | ORAL | 0 refills | Status: DC | PRN
Start: 2023-11-23 — End: 2023-11-26

## 2023-11-23 NOTE — Telephone Encounter (Signed)
 Called and advised pt.

## 2023-11-23 NOTE — Telephone Encounter (Signed)
 Dr B did her surgery on Sunday he was on call patient needs some kind of pain meds and has questions about surgery. I have already sch her for 2weeks out. [phone]

## 2023-11-26 ENCOUNTER — Telehealth (HOSPITAL_BASED_OUTPATIENT_CLINIC_OR_DEPARTMENT_OTHER): Payer: Self-pay | Admitting: Orthopaedic Surgery

## 2023-11-26 ENCOUNTER — Other Ambulatory Visit (HOSPITAL_BASED_OUTPATIENT_CLINIC_OR_DEPARTMENT_OTHER): Payer: Self-pay | Admitting: Orthopaedic Surgery

## 2023-11-26 MED ORDER — OXYCODONE HCL 5 MG PO TABS: 5.0000 mg | ORAL_TABLET | ORAL | 0 refills | Status: AC | PRN

## 2023-11-26 NOTE — Telephone Encounter (Signed)
 Patient wants to know if Dr B can refill her pain meds again.

## 2023-11-26 NOTE — Telephone Encounter (Signed)
 Lmom stating meds were sent

## 2023-11-29 ENCOUNTER — Telehealth (HOSPITAL_BASED_OUTPATIENT_CLINIC_OR_DEPARTMENT_OTHER): Payer: Self-pay | Admitting: Orthopaedic Surgery

## 2023-11-29 NOTE — Telephone Encounter (Signed)
 Patient returned call and I explained Dr. Steward Drone sent the oxycodone on Friday and if she had not picked it up, she can pick it up and I reiterated Dr. Serena Croissant message about it being the last refill. No further questions asked

## 2023-11-29 NOTE — Telephone Encounter (Signed)
 Patient needs pain meds refilled oxycodone 5mg 

## 2023-11-29 NOTE — Telephone Encounter (Signed)
 Attempted call back, primary phone number answered and was not Verneta, so I tried the second phone number and was unable to leave VM to relay message:  Dr. Steward Drone refilled oxycodone on Friday and stated that will be the last refill

## 2023-12-15 ENCOUNTER — Encounter (HOSPITAL_BASED_OUTPATIENT_CLINIC_OR_DEPARTMENT_OTHER): Payer: MEDICAID | Admitting: Orthopaedic Surgery

## 2023-12-20 ENCOUNTER — Telehealth (HOSPITAL_BASED_OUTPATIENT_CLINIC_OR_DEPARTMENT_OTHER): Payer: Self-pay | Admitting: Orthopaedic Surgery

## 2023-12-20 NOTE — Telephone Encounter (Signed)
 Patient states her foot is swollen and she wants to know if it is normal. [phone]

## 2023-12-20 NOTE — Telephone Encounter (Signed)
 Attempted CB to discuss and VM is full

## 2023-12-20 NOTE — Telephone Encounter (Signed)
 LVM to return call.

## 2023-12-22 ENCOUNTER — Ambulatory Visit (INDEPENDENT_AMBULATORY_CARE_PROVIDER_SITE_OTHER): Payer: MEDICAID

## 2023-12-22 ENCOUNTER — Ambulatory Visit (INDEPENDENT_AMBULATORY_CARE_PROVIDER_SITE_OTHER): Payer: MEDICAID | Admitting: Orthopaedic Surgery

## 2023-12-22 DIAGNOSIS — S82142A Displaced bicondylar fracture of left tibia, initial encounter for closed fracture: Secondary | ICD-10-CM

## 2023-12-22 NOTE — Progress Notes (Signed)
 Post Operative Evaluation    Procedure/Date of Surgery: Left knee tibial plateau open reduction internal fixation 3/30  Interval History:   Presents today 1 month status post above procedure.  She has been putting full weight on the knee.  She has been able to work on range of motion which is going quite well   PMH/PSH/Family History/Social History/Meds/Allergies:    Past Medical History:  Diagnosis Date   Anxiety    Bradycardia    pt says was seen at [facility] for bradycardia but was never put on any medication for it.   Chronic back pain    Chronic pelvic pain in female    Drug-seeking behavior 2015   Ovarian cyst    Renal disorder    kidney stone   Past Surgical History:  Procedure Laterality Date   APPENDECTOMY     CESAREAN SECTION     x2   CHOLECYSTECTOMY N/A 03/03/2013   Procedure: LAPAROSCOPIC CHOLECYSTECTOMY;  Surgeon: Beau Bound, MD;  Location: [facility];  Service: General;  Laterality: N/A;   CHOLECYSTECTOMY  2014   OPEN REDUCTION INTERNAL FIXATION (ORIF) HAND Left 11/21/2023   Procedure: OPEN REDUCTION INTERNAL FIXATION (ORIF) HAND;  Surgeon: Marilyn Shropshire, MD;  Location: [facility];  Service: Orthopedics;  Laterality: Left;  LEFT FOREARM   ORIF TIBIA PLATEAU Left 11/21/2023   Procedure: OPEN REDUCTION INTERNAL FIXATION (ORIF) TIBIAL PLATEAU;  Surgeon: Wilhelmenia Harada, MD;  Location: [facility] OR;  Service: Orthopedics;  Laterality: Left;   Social History   Socioeconomic History   Marital status: Single    Spouse name: Not on file   Number of children: Not on file   Years of education: Not on file   Highest education level: Not on file  Occupational History   Not on file  Tobacco Use   Smoking status: Every Day    Current packs/day: 0.50    Average packs/day: 0.5 packs/day for 3.0 years (1.5 ttl pk-yrs)    Types: Cigarettes   Smokeless tobacco: Never  Vaping Use   Vaping status: Former   Substances: Nicotine , Flavoring   Substance and Sexual Activity   Alcohol use: No   Drug use: No    Comment: used to   Sexual activity: Yes    Birth control/protection: None  Other Topics Concern   Not on file  Social History Narrative   Not on file   Social Drivers of Health   Financial Resource Strain: Not on file  Food Insecurity: Food Insecurity Present (11/20/2023)   Hunger Vital Sign    Worried About Running Out of Food in the Last Year: Sometimes true    Ran Out of Food in the Last Year: Sometimes true  Transportation Needs: No Transportation Needs (11/20/2023)   PRAPARE - Administrator, Civil Service (Medical): No    Lack of Transportation (Non-Medical): No  Physical Activity: Not on file  Stress: Not on file  Social Connections: Socially Isolated (11/20/2023)   Social Connection and Isolation Panel [NHANES]    Frequency of Communication with Friends and Family: Three times a week    Frequency of Social Gatherings with Friends and Family: Three times a week    Attends Religious Services: Never    Active Member of Clubs or Organizations: No    Attends Banker  Meetings: Never    Marital Status: Divorced   Family History  Family history unknown: Yes   Allergies  Allergen Reactions   Naprosyn  [Naproxen ] Hives, Nausea And Vomiting and Rash   Neurontin [Gabapentin] Rash   Ultram  [Tramadol ] Rash   Current Outpatient Medications  Medication Sig Dispense Refill   acetaminophen  (TYLENOL ) 500 MG tablet Take 1,000 mg by mouth 2 (two) times daily as needed for moderate pain (pain score 4-6) or headache.     oxyCODONE  (ROXICODONE ) 5 MG immediate release tablet Take 1 tablet (5 mg total) by mouth every 4 (four) hours as needed for severe pain (pain score 7-10) or breakthrough pain. 20 tablet 0   No current facility-administered medications for this visit.   DG Knee Complete 4 Views Left Result Date: 12/22/2023 CLINICAL DATA:  Postop. EXAM: LEFT KNEE - COMPLETE 4+ VIEW COMPARISON:   Preoperative imaging FINDINGS: Lateral plate and screw fixation of proximal tibial fracture. Hardware is intact without periprosthetic lucency. Decreasing conspicuity of the fracture line consistent with interval healing. Small knee joint effusion persists. IMPRESSION: ORIF of proximal tibial fracture without hardware complication. Electronically Signed   By: Chadwick Colonel M.D.   On: 12/22/2023 12:00    Review of Systems:   A ROS was performed including pertinent positives and negatives as documented in the HPI.   Musculoskeletal Exam:     Left knee incision is well-healing.  Range of motion is from 0 to 100 degrees.  No joint line tenderness distal neurosensory exam is intact  Imaging:    3 views left knee: Status post open reduction internal fixation without complication  I personally reviewed and interpreted the radiographs.   Assessment:   4-week status post open reduction internal fixation left knee overall doing well.  At this time she will continue weightbearing as tolerated.  I will plan to see her back in 1 month for final check  Plan :    - Return to clinic 1 month for final check      I personally saw and evaluated the patient, and participated in the management and treatment plan.  Wilhelmenia Harada, MD Attending Physician, Orthopedic Surgery  This document was dictated using Dragon voice recognition software. A reasonable attempt at proof reading has been made to minimize errors.

## 2024-01-21 ENCOUNTER — Encounter (HOSPITAL_BASED_OUTPATIENT_CLINIC_OR_DEPARTMENT_OTHER): Payer: MEDICAID | Admitting: Orthopaedic Surgery

## 2024-02-11 ENCOUNTER — Emergency Department (HOSPITAL_COMMUNITY): Payer: MEDICAID

## 2024-02-11 ENCOUNTER — Other Ambulatory Visit: Payer: Self-pay

## 2024-02-11 ENCOUNTER — Emergency Department (HOSPITAL_COMMUNITY)
Admission: EM | Admit: 2024-02-11 | Discharge: 2024-02-11 | Discharge status: Against Medical Advice | Payer: MEDICAID | Attending: Student | Admitting: Student

## 2024-02-11 ENCOUNTER — Encounter (HOSPITAL_COMMUNITY): Payer: Self-pay

## 2024-02-11 DIAGNOSIS — M25552 Pain in left hip: Secondary | ICD-10-CM

## 2024-02-11 LAB — POC URINE PREG, ED: Preg Test, Ur: NEGATIVE

## 2024-02-11 MED ORDER — OXYCODONE HCL 5 MG PO TABS
5.0000 mg | ORAL_TABLET | Freq: Once | ORAL | Status: AC
  Administered 2024-02-11: 5 mg via ORAL
  Filled 2024-02-11: qty 1
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
## 2024-02-14 ENCOUNTER — Telehealth (HOSPITAL_BASED_OUTPATIENT_CLINIC_OR_DEPARTMENT_OTHER): Payer: Self-pay | Admitting: Orthopaedic Surgery

## 2024-02-14 NOTE — Telephone Encounter (Signed)
 Called patient and ask her to call the office to make an appointment @[phone] 
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
## 2024-02-16 ENCOUNTER — Ambulatory Visit: Payer: MEDICAID | Admitting: Physical Therapy

## 2024-02-16 ENCOUNTER — Ambulatory Visit (HOSPITAL_BASED_OUTPATIENT_CLINIC_OR_DEPARTMENT_OTHER): Payer: MEDICAID | Admitting: Orthopaedic Surgery

## 2024-02-16 ENCOUNTER — Encounter: Payer: Self-pay | Admitting: Physical Therapy

## 2024-02-16 ENCOUNTER — Telehealth (HOSPITAL_BASED_OUTPATIENT_CLINIC_OR_DEPARTMENT_OTHER): Payer: Self-pay | Admitting: Orthopaedic Surgery

## 2024-02-16 DIAGNOSIS — M25552 Pain in left hip: Secondary | ICD-10-CM

## 2024-02-16 DIAGNOSIS — S82142A Displaced bicondylar fracture of left tibia, initial encounter for closed fracture: Secondary | ICD-10-CM

## 2024-02-16 NOTE — Progress Notes (Signed)
 Post Operative Evaluation    Procedure/Date of Surgery: Left knee tibial plateau open reduction internal fixation 3/30  Interval History:   Presents today status post the above procedure.  She is doing extremely well with her knee.  She is still having persistent pain about the hip femoral acetabular joint.  She is experiencing pain in the C-shaped distribution about the groin.  This has been limiting her ability to walk.   PMH/PSH/Family History/Social History/Meds/Allergies:    Past Medical History:  Diagnosis Date   Anxiety    Bradycardia    pt says was seen at [facility] for bradycardia but was never put on any medication for it.   Chronic back pain    Chronic pelvic pain in female    Drug-seeking behavior 2015   Ovarian cyst    Renal disorder    kidney stone   Past Surgical History:  Procedure Laterality Date   APPENDECTOMY     CESAREAN SECTION     x2   CHOLECYSTECTOMY N/A 03/03/2013   Procedure: LAPAROSCOPIC CHOLECYSTECTOMY;  Surgeon: Oneil DELENA Budge, MD;  Location: [facility];  Service: General;  Laterality: N/A;   CHOLECYSTECTOMY  2014   OPEN REDUCTION INTERNAL FIXATION (ORIF) HAND Left 11/21/2023   Procedure: OPEN REDUCTION INTERNAL FIXATION (ORIF) HAND;  Surgeon: Romona Harari, MD;  Location: [facility] OR;  Service: Orthopedics;  Laterality: Left;  LEFT FOREARM   ORIF TIBIA PLATEAU Left 11/21/2023   Procedure: OPEN REDUCTION INTERNAL FIXATION (ORIF) TIBIAL PLATEAU;  Surgeon: Genelle Standing, MD;  Location: [facility] OR;  Service: Orthopedics;  Laterality: Left;   Social History   Socioeconomic History   Marital status: Single    Spouse name: Not on file   Number of children: Not on file   Years of education: Not on file   Highest education level: Not on file  Occupational History   Not on file  Tobacco Use   Smoking status: Every Day    Current packs/day: 0.50    Average packs/day: 0.5 packs/day for 3.0 years (1.5 ttl pk-yrs)    Types:  Cigarettes   Smokeless tobacco: Never  Vaping Use   Vaping status: Former   Substances: Nicotine , Flavoring  Substance and Sexual Activity   Alcohol use: No   Drug use: Yes    Types: Marijuana    Comment: used to   Sexual activity: Yes    Birth control/protection: None  Other Topics Concern   Not on file  Social History Narrative   Not on file   Social Drivers of Health   Financial Resource Strain: Not on file  Food Insecurity: Food Insecurity Present (11/20/2023)   Hunger Vital Sign    Worried About Running Out of Food in the Last Year: Sometimes true    Ran Out of Food in the Last Year: Sometimes true  Transportation Needs: No Transportation Needs (11/20/2023)   PRAPARE - Administrator, Civil Service (Medical): No    Lack of Transportation (Non-Medical): No  Physical Activity: Not on file  Stress: Not on file  Social Connections: Socially Isolated (11/20/2023)   Social Connection and Isolation Panel    Frequency of Communication with Friends and Family: Three times a week    Frequency of Social Gatherings with Friends and Family: Three times a week    Attends  Religious Services: Never    Active Member of Clubs or Organizations: No    Attends Banker Meetings: Never    Marital Status: Divorced   Family History  Family history unknown: Yes   Allergies  Allergen Reactions   Naprosyn  [Naproxen ] Hives, Nausea And Vomiting and Rash   Neurontin [Gabapentin] Rash   Ultram  [Tramadol ] Rash   Current Outpatient Medications  Medication Sig Dispense Refill   acetaminophen  (TYLENOL ) 500 MG tablet Take 1,000 mg by mouth 2 (two) times daily as needed for moderate pain (pain score 4-6) or headache.     oxyCODONE  (ROXICODONE ) 5 MG immediate release tablet Take 1 tablet (5 mg total) by mouth every 4 (four) hours as needed for severe pain (pain score 7-10) or breakthrough pain. 20 tablet 0   No current facility-administered medications for this visit.   No  results found.   Review of Systems:   A ROS was performed including pertinent positives and negatives as documented in the HPI.   Musculoskeletal Exam:     Left knee incision is well-healing.  Range of motion is from 0 to 100 degrees.  No joint line tenderness distal neurosensory exam is intact  Left hip with a positive FADIR, internal rotation is to 30 degrees again with pain and particular at 90 degrees of flexion external rotation of 45 degrees causes no pain.  Good abduction strength mildly antalgic gait distal neurosensory exam is intact  Imaging:    3 views left knee: Status post open reduction internal fixation without complication  CT scan left hip: There is evidence of an intra-articular loose body at the inferior portion of the hip  I personally reviewed and interpreted the radiographs.   Assessment:   38 year old female with left hip pain consistent with a small intra-articular loose body and possible labral tearing in the setting of an acetabular fracture.  At this time she is having persistent hip and groin symptoms but I would like to obtain an MRI that we can assess her cartilage as well as for any labral tear to see her back following discuss results.  Plan :    - Return to clinic following MRI left hip      I personally saw and evaluated the patient, and participated in the management and treatment plan.  Elspeth Parker, MD Attending Physician, Orthopedic Surgery  This document was dictated using Dragon voice recognition software. A reasonable attempt at proof reading has been made to minimize errors.

## 2024-02-16 NOTE — Telephone Encounter (Signed)
 Patient would like something called in for pain
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
## 2024-03-08 ENCOUNTER — Encounter (HOSPITAL_BASED_OUTPATIENT_CLINIC_OR_DEPARTMENT_OTHER): Payer: Self-pay

## 2024-03-08 ENCOUNTER — Encounter (HOSPITAL_BASED_OUTPATIENT_CLINIC_OR_DEPARTMENT_OTHER): Payer: MEDICAID | Admitting: Orthopaedic Surgery

## 2024-03-13 ENCOUNTER — Encounter (HOSPITAL_BASED_OUTPATIENT_CLINIC_OR_DEPARTMENT_OTHER): Payer: Self-pay | Admitting: Orthopaedic Surgery

## 2024-03-17 ENCOUNTER — Ambulatory Visit
Admission: RE | Admit: 2024-03-17 | Discharge: 2024-03-17 | Disposition: A | Discharge status: Home / Self Care | Payer: MEDICAID | Source: Ambulatory Visit | Attending: Orthopaedic Surgery | Admitting: Orthopaedic Surgery

## 2024-03-17 DIAGNOSIS — M25552 Pain in left hip: Secondary | ICD-10-CM

## 2024-03-22 ENCOUNTER — Ambulatory Visit (HOSPITAL_BASED_OUTPATIENT_CLINIC_OR_DEPARTMENT_OTHER): Payer: Self-pay | Admitting: Orthopaedic Surgery

## 2024-03-22 ENCOUNTER — Other Ambulatory Visit (HOSPITAL_BASED_OUTPATIENT_CLINIC_OR_DEPARTMENT_OTHER): Payer: Self-pay

## 2024-03-22 ENCOUNTER — Other Ambulatory Visit (HOSPITAL_BASED_OUTPATIENT_CLINIC_OR_DEPARTMENT_OTHER): Payer: Self-pay | Admitting: Orthopaedic Surgery

## 2024-03-22 ENCOUNTER — Ambulatory Visit (INDEPENDENT_AMBULATORY_CARE_PROVIDER_SITE_OTHER): Payer: MEDICAID | Admitting: Orthopaedic Surgery

## 2024-03-22 DIAGNOSIS — S73192A Other sprain of left hip, initial encounter: Secondary | ICD-10-CM

## 2024-03-22 DIAGNOSIS — S82142A Displaced bicondylar fracture of left tibia, initial encounter for closed fracture: Secondary | ICD-10-CM | POA: Diagnosis not present

## 2024-03-22 MED ORDER — OXYCODONE HCL 5 MG PO TABS
5.0000 mg | ORAL_TABLET | ORAL | 0 refills | Status: DC | PRN
  Filled 2024-03-22: qty 10, 2d supply, fill #0

## 2024-03-22 MED ORDER — ACETAMINOPHEN 500 MG PO TABS
500.0000 mg | ORAL_TABLET | Freq: Three times a day (TID) | ORAL | 0 refills | Status: AC
  Filled 2024-03-22: qty 30, 10d supply, fill #0

## 2024-03-22 MED ORDER — ASPIRIN 325 MG PO TBEC
325.0000 mg | DELAYED_RELEASE_TABLET | Freq: Every day | ORAL | 0 refills | Status: DC
Start: 2024-03-22 — End: 2024-04-14
  Filled 2024-03-22: qty 14, 14d supply, fill #0

## 2024-03-22 MED ORDER — IBUPROFEN 800 MG PO TABS
800.0000 mg | ORAL_TABLET | Freq: Three times a day (TID) | ORAL | 0 refills | Status: DC
  Filled 2024-03-22: qty 30, 10d supply, fill #0
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
## 2024-04-04 ENCOUNTER — Other Ambulatory Visit (HOSPITAL_BASED_OUTPATIENT_CLINIC_OR_DEPARTMENT_OTHER): Payer: Self-pay

## 2024-04-04 ENCOUNTER — Other Ambulatory Visit (HOSPITAL_BASED_OUTPATIENT_CLINIC_OR_DEPARTMENT_OTHER): Payer: Self-pay | Admitting: Orthopaedic Surgery

## 2024-04-04 MED ORDER — OXYCODONE HCL 5 MG PO TABS
5.0000 mg | ORAL_TABLET | ORAL | 0 refills | Status: DC | PRN
  Filled 2024-04-04: qty 10, 2d supply, fill #0

## 2024-04-11 ENCOUNTER — Encounter (HOSPITAL_BASED_OUTPATIENT_CLINIC_OR_DEPARTMENT_OTHER): Payer: Self-pay | Admitting: Orthopaedic Surgery

## 2024-04-12 ENCOUNTER — Ambulatory Visit (HOSPITAL_COMMUNITY)
Admission: RE | Admit: 2024-04-12 | Discharge: 2024-04-12 | Disposition: A | Discharge status: Home / Self Care | Payer: MEDICAID | Source: Ambulatory Visit | Attending: Orthopaedic Surgery | Admitting: Orthopaedic Surgery

## 2024-04-12 ENCOUNTER — Other Ambulatory Visit (HOSPITAL_BASED_OUTPATIENT_CLINIC_OR_DEPARTMENT_OTHER): Payer: Self-pay | Admitting: Orthopaedic Surgery

## 2024-04-12 DIAGNOSIS — S73192A Other sprain of left hip, initial encounter: Secondary | ICD-10-CM

## 2024-04-12 DIAGNOSIS — R2242 Localized swelling, mass and lump, left lower limb: Secondary | ICD-10-CM | POA: Insufficient documentation

## 2024-04-12 NOTE — Telephone Encounter (Signed)
 Ordered and contact [facility]

## 2024-04-14 ENCOUNTER — Other Ambulatory Visit (HOSPITAL_BASED_OUTPATIENT_CLINIC_OR_DEPARTMENT_OTHER): Payer: Self-pay

## 2024-04-14 ENCOUNTER — Other Ambulatory Visit (HOSPITAL_BASED_OUTPATIENT_CLINIC_OR_DEPARTMENT_OTHER): Payer: Self-pay | Admitting: Orthopaedic Surgery

## 2024-04-14 MED ORDER — ASPIRIN 325 MG PO TBEC
325.0000 mg | DELAYED_RELEASE_TABLET | Freq: Every day | ORAL | 0 refills | Status: AC
  Filled 2024-04-14: qty 14, 14d supply, fill #0

## 2024-04-14 MED ORDER — OXYCODONE HCL 5 MG PO TABS
5.0000 mg | ORAL_TABLET | ORAL | 0 refills | Status: DC | PRN
  Filled 2024-04-14: qty 10, 2d supply, fill #0

## 2024-04-17 ENCOUNTER — Other Ambulatory Visit: Payer: Self-pay

## 2024-04-17 ENCOUNTER — Encounter (HOSPITAL_BASED_OUTPATIENT_CLINIC_OR_DEPARTMENT_OTHER): Payer: Self-pay | Admitting: Orthopaedic Surgery

## 2024-04-21 ENCOUNTER — Other Ambulatory Visit (HOSPITAL_BASED_OUTPATIENT_CLINIC_OR_DEPARTMENT_OTHER): Payer: Self-pay | Admitting: Orthopaedic Surgery

## 2024-04-24 ENCOUNTER — Encounter (HOSPITAL_BASED_OUTPATIENT_CLINIC_OR_DEPARTMENT_OTHER): Payer: Self-pay

## 2024-04-25 ENCOUNTER — Other Ambulatory Visit (HOSPITAL_BASED_OUTPATIENT_CLINIC_OR_DEPARTMENT_OTHER): Payer: Self-pay

## 2024-04-25 ENCOUNTER — Ambulatory Visit (HOSPITAL_BASED_OUTPATIENT_CLINIC_OR_DEPARTMENT_OTHER): Payer: MEDICAID | Admitting: Anesthesiology

## 2024-04-25 ENCOUNTER — Encounter (HOSPITAL_BASED_OUTPATIENT_CLINIC_OR_DEPARTMENT_OTHER): Payer: Self-pay | Admitting: Orthopaedic Surgery

## 2024-04-25 ENCOUNTER — Ambulatory Visit (HOSPITAL_BASED_OUTPATIENT_CLINIC_OR_DEPARTMENT_OTHER)
Admission: RE | Admit: 2024-04-25 | Discharge: 2024-04-25 | Disposition: A | Discharge status: Home / Self Care | Payer: MEDICAID | Attending: Orthopaedic Surgery | Admitting: Orthopaedic Surgery

## 2024-04-25 ENCOUNTER — Ambulatory Visit (HOSPITAL_COMMUNITY): Payer: MEDICAID

## 2024-04-25 ENCOUNTER — Encounter (HOSPITAL_BASED_OUTPATIENT_CLINIC_OR_DEPARTMENT_OTHER)
Admission: RE | Disposition: A | Discharge status: Home / Self Care | Payer: Self-pay | Source: Home / Self Care | Attending: Orthopaedic Surgery

## 2024-04-25 ENCOUNTER — Other Ambulatory Visit: Payer: Self-pay

## 2024-04-25 DIAGNOSIS — S73192A Other sprain of left hip, initial encounter: Secondary | ICD-10-CM | POA: Insufficient documentation

## 2024-04-25 DIAGNOSIS — E669 Obesity, unspecified: Secondary | ICD-10-CM | POA: Insufficient documentation

## 2024-04-25 DIAGNOSIS — Z604 Social exclusion and rejection: Secondary | ICD-10-CM | POA: Diagnosis not present

## 2024-04-25 DIAGNOSIS — Z6828 Body mass index (BMI) 28.0-28.9, adult: Secondary | ICD-10-CM | POA: Insufficient documentation

## 2024-04-25 DIAGNOSIS — X58XXXA Exposure to other specified factors, initial encounter: Secondary | ICD-10-CM | POA: Diagnosis not present

## 2024-04-25 DIAGNOSIS — F17210 Nicotine dependence, cigarettes, uncomplicated: Secondary | ICD-10-CM | POA: Insufficient documentation

## 2024-04-25 DIAGNOSIS — Z01818 Encounter for other preprocedural examination: Secondary | ICD-10-CM

## 2024-04-25 DIAGNOSIS — G8929 Other chronic pain: Secondary | ICD-10-CM | POA: Diagnosis not present

## 2024-04-25 LAB — POCT PREGNANCY, URINE: Preg Test, Ur: NEGATIVE

## 2024-04-25 SURGERY — ARTHROSCOPY, HIP, WITH LABRUM REPAIR
Anesthesia: General | Site: Hip | Laterality: Left

## 2024-04-25 MED ORDER — PROPOFOL 10 MG/ML IV BOLUS
INTRAVENOUS | Status: DC | PRN
  Administered 2024-04-25: 160 ug via INTRAVENOUS

## 2024-04-25 MED ORDER — LACTATED RINGERS IV SOLN: INTRAVENOUS | Status: DC

## 2024-04-25 MED ORDER — ROCURONIUM 10MG/ML (10ML) SYRINGE FOR MEDFUSION PUMP - OPTIME
INTRAVENOUS | Status: DC | PRN
  Administered 2024-04-25: 10 mg via INTRAVENOUS
  Administered 2024-04-25: 70 mg via INTRAVENOUS

## 2024-04-25 MED ORDER — DEXAMETHASONE SODIUM PHOSPHATE 10 MG/ML IJ SOLN
INTRAMUSCULAR | Status: AC
Start: 2024-04-25 — End: 2024-04-25
  Filled 2024-04-25: qty 1

## 2024-04-25 MED ORDER — HYDROMORPHONE HCL 1 MG/ML IJ SOLN
INTRAMUSCULAR | Status: AC
Start: 2024-04-25 — End: 2024-04-25
  Filled 2024-04-25: qty 0.5

## 2024-04-25 MED ORDER — OXYCODONE HCL 5 MG PO TABS
5.0000 mg | ORAL_TABLET | ORAL | 0 refills | Status: DC | PRN
  Filled 2024-04-25: qty 10, 2d supply, fill #0

## 2024-04-25 MED ORDER — MIDAZOLAM HCL 2 MG/2ML IJ SOLN
INTRAMUSCULAR | Status: AC
Start: 2024-04-25 — End: 2024-04-25
  Filled 2024-04-25: qty 2

## 2024-04-25 MED ORDER — DEXAMETHASONE SODIUM PHOSPHATE 10 MG/ML IJ SOLN
INTRAMUSCULAR | Status: DC | PRN
  Administered 2024-04-25: 5 mg via INTRAVENOUS

## 2024-04-25 MED ORDER — FENTANYL CITRATE (PF) 100 MCG/2ML IJ SOLN
INTRAMUSCULAR | Status: AC
  Filled 2024-04-25: qty 2

## 2024-04-25 MED ORDER — ONDANSETRON HCL 4 MG/2ML IJ SOLN
INTRAMUSCULAR | Status: DC | PRN
  Administered 2024-04-25: 4 mg via INTRAVENOUS

## 2024-04-25 MED ORDER — ROCURONIUM BROMIDE 10 MG/ML (PF) SYRINGE
PREFILLED_SYRINGE | INTRAVENOUS | Status: AC
Start: 2024-04-25 — End: 2024-04-25
  Filled 2024-04-25: qty 10

## 2024-04-25 MED ORDER — PROPOFOL 10 MG/ML IV BOLUS
INTRAVENOUS | Status: AC
  Filled 2024-04-25: qty 20

## 2024-04-25 MED ORDER — BUPIVACAINE HCL 0.25 % IJ SOLN
INTRAMUSCULAR | Status: DC | PRN
  Administered 2024-04-25: 20 mL

## 2024-04-25 MED ORDER — LACTATED RINGERS IV SOLN: INTRAVENOUS | Status: DC | PRN

## 2024-04-25 MED ORDER — DROPERIDOL 2.5 MG/ML IJ SOLN: 0.6250 mg | Freq: Once | INTRAMUSCULAR | Status: DC | PRN

## 2024-04-25 MED ORDER — PHENYLEPHRINE 80 MCG/ML (10ML) SYRINGE FOR IV PUSH (FOR BLOOD PRESSURE SUPPORT)
PREFILLED_SYRINGE | INTRAVENOUS | Status: DC | PRN
Start: 2024-04-25 — End: 2024-04-25
  Administered 2024-04-25 (×2): 160 ug via INTRAVENOUS

## 2024-04-25 MED ORDER — DEXMEDETOMIDINE HCL IN NACL 80 MCG/20ML IV SOLN
INTRAVENOUS | Status: DC | PRN
  Administered 2024-04-25: 12 ug via INTRAVENOUS
  Administered 2024-04-25: 8 ug via INTRAVENOUS

## 2024-04-25 MED ORDER — MIDAZOLAM HCL 2 MG/2ML IJ SOLN
INTRAMUSCULAR | Status: AC
  Filled 2024-04-25: qty 2

## 2024-04-25 MED ORDER — OXYCODONE HCL 5 MG PO TABS
5.0000 mg | ORAL_TABLET | Freq: Once | ORAL | Status: AC | PRN
  Administered 2024-04-25: 5 mg via ORAL

## 2024-04-25 MED ORDER — LIDOCAINE 2% (20 MG/ML) 5 ML SYRINGE
INTRAMUSCULAR | Status: DC | PRN
  Administered 2024-04-25: 80 mg via INTRAVENOUS

## 2024-04-25 MED ORDER — MIDAZOLAM HCL 5 MG/5ML IJ SOLN
INTRAMUSCULAR | Status: DC | PRN
  Administered 2024-04-25: 2 mg via INTRAVENOUS

## 2024-04-25 MED ORDER — ACETAMINOPHEN 500 MG PO TABS
ORAL_TABLET | ORAL | Status: AC
  Filled 2024-04-25: qty 2

## 2024-04-25 MED ORDER — SODIUM CHLORIDE 0.9 % IR SOLN
Status: DC | PRN
  Administered 2024-04-25: 6000 mL

## 2024-04-25 MED ORDER — ONDANSETRON HCL 4 MG/2ML IJ SOLN: 4.0000 mg | Freq: Once | INTRAMUSCULAR | Status: DC | PRN

## 2024-04-25 MED ORDER — FENTANYL CITRATE (PF) 100 MCG/2ML IJ SOLN
INTRAMUSCULAR | Status: DC | PRN
  Administered 2024-04-25: 50 ug via INTRAVENOUS
  Administered 2024-04-25: 100 ug via INTRAVENOUS
  Administered 2024-04-25: 50 ug via INTRAVENOUS

## 2024-04-25 MED ORDER — CEFAZOLIN SODIUM-DEXTROSE 2-4 GM/100ML-% IV SOLN
2.0000 g | INTRAVENOUS | Status: AC
  Administered 2024-04-25: 2 g via INTRAVENOUS

## 2024-04-25 MED ORDER — ONDANSETRON HCL 4 MG/2ML IJ SOLN
INTRAMUSCULAR | Status: AC
  Filled 2024-04-25: qty 2

## 2024-04-25 MED ORDER — GABAPENTIN 300 MG PO CAPS: 300.0000 mg | ORAL_CAPSULE | Freq: Once | ORAL | Status: DC

## 2024-04-25 MED ORDER — OXYCODONE HCL 5 MG/5ML PO SOLN: 5.0000 mg | Freq: Once | ORAL | Status: AC | PRN

## 2024-04-25 MED ORDER — TRANEXAMIC ACID-NACL 1000-0.7 MG/100ML-% IV SOLN
INTRAVENOUS | Status: AC
  Filled 2024-04-25: qty 100

## 2024-04-25 MED ORDER — GABAPENTIN 300 MG PO CAPS
ORAL_CAPSULE | ORAL | Status: AC
  Filled 2024-04-25: qty 1

## 2024-04-25 MED ORDER — SUGAMMADEX SODIUM 200 MG/2ML IV SOLN
INTRAVENOUS | Status: DC | PRN
  Administered 2024-04-25: 300 mg via INTRAVENOUS

## 2024-04-25 MED ORDER — HYDROMORPHONE HCL 1 MG/ML IJ SOLN
0.2500 mg | INTRAMUSCULAR | Status: DC | PRN
  Administered 2024-04-25 (×2): 0.5 mg via INTRAVENOUS

## 2024-04-25 MED ORDER — ACETAMINOPHEN 500 MG PO TABS
1000.0000 mg | ORAL_TABLET | Freq: Once | ORAL | Status: AC
  Administered 2024-04-25: 1000 mg via ORAL

## 2024-04-25 MED ORDER — TRANEXAMIC ACID-NACL 1000-0.7 MG/100ML-% IV SOLN
1000.0000 mg | INTRAVENOUS | Status: AC
  Administered 2024-04-25: 1000 mg via INTRAVENOUS

## 2024-04-25 MED ORDER — LIDOCAINE 2% (20 MG/ML) 5 ML SYRINGE
INTRAMUSCULAR | Status: AC
  Filled 2024-04-25: qty 5

## 2024-04-25 MED ORDER — CEFAZOLIN SODIUM-DEXTROSE 2-4 GM/100ML-% IV SOLN
INTRAVENOUS | Status: AC
  Filled 2024-04-25: qty 100

## 2024-04-25 MED ORDER — OXYCODONE HCL 5 MG PO TABS
ORAL_TABLET | ORAL | Status: AC
  Filled 2024-04-25: qty 1

## 2024-04-25 SURGICAL SUPPLY — 56 items
ANCHOR SUT 1.4 FLEX (Anchor) IMPLANT
BIT DRILL FLEX NANOTACK (BIT) IMPLANT
BLADE SAMURAI STR FULL RADIUS (BLADE) IMPLANT
BLADE SURG 11 STRL SS (BLADE) ×1 IMPLANT
BUR ROUND HI FLUTE 8 4X19 (BURR) IMPLANT
CANISTER SUCT 1200ML W/VALVE (MISCELLANEOUS) ×1 IMPLANT
CANNULA OBTURATOR FLOWPORT ST5 (CANNULA) IMPLANT
CHLORAPREP W/TINT 26 (MISCELLANEOUS) ×1 IMPLANT
COOLER ICEMAN CLASSIC (MISCELLANEOUS) ×1 IMPLANT
COVER BACK TABLE 60X90IN (DRAPES) ×1 IMPLANT
COVER MAYO STAND STRL (DRAPES) ×2 IMPLANT
DERMABOND ADVANCED .7 DNX12 (GAUZE/BANDAGES/DRESSINGS) IMPLANT
DISSECTOR 4.2MMX19CM HL (MISCELLANEOUS) ×1 IMPLANT
DRAPE C-ARM 42X72 X-RAY (DRAPES) ×1 IMPLANT
DRAPE STERI IOBAN 125X83 (DRAPES) IMPLANT
DRAPE U-SHAPE 47X51 STRL (DRAPES) ×2 IMPLANT
DRSG TEGADERM 4X4.75 (GAUZE/BANDAGES/DRESSINGS) ×3 IMPLANT
FEE RENTAL EQUIP HIP INSTR KIT (INSTRUMENTS) IMPLANT
GAUZE PAD ABD 8X10 STRL (GAUZE/BANDAGES/DRESSINGS) IMPLANT
GAUZE SPONGE 4X4 12PLY STRL (GAUZE/BANDAGES/DRESSINGS) ×1 IMPLANT
GAUZE XEROFORM 1X8 LF (GAUZE/BANDAGES/DRESSINGS) ×1 IMPLANT
GLOVE BIO SURGEON STRL SZ 6 (GLOVE) ×2 IMPLANT
GLOVE BIO SURGEON STRL SZ7.5 (GLOVE) ×2 IMPLANT
GLOVE BIOGEL PI IND STRL 6.5 (GLOVE) ×1 IMPLANT
GLOVE BIOGEL PI IND STRL 8 (GLOVE) ×1 IMPLANT
GOWN STRL REUS W/ TWL LRG LVL3 (GOWN DISPOSABLE) ×2 IMPLANT
GOWN STRL REUS W/TWL XL LVL3 (GOWN DISPOSABLE) ×1 IMPLANT
INSTRUMENT ORTHO TEXT HIP FEM (INSTRUMENTS) IMPLANT
KIT PATIENT POSITION MEDIUM (KITS) IMPLANT
KIT PORTAL ENTRY HIP ACCESS (KITS) IMPLANT
MANIFOLD NEPTUNE II (INSTRUMENTS) ×1 IMPLANT
NDL HYPO 22X1.5 SAFETY MO (MISCELLANEOUS) IMPLANT
NDL INJECTOR II CARTRIDGE (MISCELLANEOUS) IMPLANT
NDL SPNL 18GX3.5 QUINCKE PK (NEEDLE) ×1 IMPLANT
NDL SUT 6 .5 CRC .975X.05 MAYO (NEEDLE) IMPLANT
NEEDLE HYPO 22X1.5 SAFETY MO (MISCELLANEOUS) IMPLANT
NEEDLE INJECTOR II CARTRIDGE (MISCELLANEOUS) ×1 IMPLANT
NEEDLE SPNL 18GX3.5 QUINCKE PK (NEEDLE) IMPLANT
PACK BASIN DAY SURGERY FS (CUSTOM PROCEDURE TRAY) ×1 IMPLANT
PAD COLD SHLDR WRAP-ON (PAD) ×1 IMPLANT
PASSER SUT 1.5D CRESCENT (INSTRUMENTS) IMPLANT
SPIKE FLUID TRANSFER (MISCELLANEOUS) IMPLANT
SPONGE T-LAP 18X18 ~~LOC~~+RFID (SPONGE) IMPLANT
SUCTION TUBE FRAZIER 10FR DISP (SUCTIONS) IMPLANT
SUT ETHILON 3 0 PS 1 (SUTURE) ×1 IMPLANT
SUT VIC AB 0 CT1 27XBRD ANBCTR (SUTURE) IMPLANT
SUT VIC AB 2-0 CT1 TAPERPNT 27 (SUTURE) IMPLANT
SUT XBRAID 1.4 BLUE (SUTURE) IMPLANT
SUTURE FIBERWR #2 38 T-5 BLUE (SUTURE) IMPLANT
SUTURE TAPE 1.3 FIBERLOP 20 ST (SUTURE) IMPLANT
SYR 20ML LL LF (SYRINGE) IMPLANT
SYR 50ML LL SCALE MARK (SYRINGE) ×1 IMPLANT
TOWEL GREEN STERILE FF (TOWEL DISPOSABLE) ×2 IMPLANT
TUBE CONNECTING 20X1/4 (TUBING) ×3 IMPLANT
TUBING ARTHROSCOPY IRRIG 16FT (MISCELLANEOUS) ×1 IMPLANT
WAND APOLLO RF 50D ABLATOR (BUR) ×1 IMPLANT

## 2024-04-25 NOTE — Anesthesia Postprocedure Evaluation (Signed)
 Anesthesia Post Note  Patient: Valerie Robinson  Procedure(s) Performed: ARTHROSCOPY, HIP, WITH LABRUM REPAIR (Left: Hip)     Patient location during evaluation: PACU Anesthesia Type: General Level of consciousness: awake and alert and oriented Pain management: pain level controlled Vital Signs Assessment: post-procedure vital signs reviewed and stable Respiratory status: spontaneous breathing, nonlabored ventilation and respiratory function stable Cardiovascular status: blood pressure returned to baseline and stable Postop Assessment: no apparent nausea or vomiting Anesthetic complications: no   No notable events documented.  Last Vitals:  Vitals:   04/25/24 1416 04/25/24 1423  BP: 133/70 129/79  Pulse: (!) 50 (!) 51  Resp: (!) 7 12  Temp:    SpO2: 100% 100%    Last Pain:  Vitals:   04/25/24 1435  TempSrc:   PainSc: 7                  Pardeep Pautz A.

## 2024-04-25 NOTE — Anesthesia Procedure Notes (Signed)
 Procedure Name: Intubation Date/Time: 04/25/2024 12:18 PM  Performed by: Burnard Rosaline HERO, CRNAPre-anesthesia Checklist: Patient identified, Emergency Drugs available, Suction available and Patient being monitored Patient Re-evaluated:Patient Re-evaluated prior to induction Oxygen Delivery Method: Circle system utilized Preoxygenation: Pre-oxygenation with 100% oxygen Induction Type: IV induction Ventilation: Mask ventilation without difficulty Laryngoscope Size: Mac and 4 Grade View: Grade I Tube type: Oral Tube size: 7.0 mm Number of attempts: 1 Airway Equipment and Method: Stylet and Oral airway Placement Confirmation: ETT inserted through vocal cords under direct vision, positive ETCO2, breath sounds checked- equal and bilateral and CO2 detector Secured at: 22 cm Tube secured with: Tape Dental Injury: Teeth and Oropharynx as per pre-operative assessment

## 2024-04-25 NOTE — Discharge Instructions (Addendum)
 Discharge Instructions    Attending Surgeon: Elspeth Parker, MD Office Phone Number: [phone]   Diagnosis and Procedures:    Surgeries Performed: Left hip labral repair  Discharge Plan:    Diet: Resume usual diet. Begin with light or bland foods.  Drink plenty of fluids.  Activity:  Weight bearing as tolerated left hip. You are advised to go home directly from the hospital or surgical center. Restrict your activities.  GENERAL INSTRUCTIONS: 1.  Please apply ice to your wound to help with swelling and inflammation. This will improve your comfort and your overall recovery following surgery.     2. Please call Dr. Danetta office at [phone] with questions Monday-Friday during business hours. If no one answers, please leave a message and someone should get back to the patient within 24 hours. For emergencies please call 911 or proceed to the emergency room.   3. Patient to notify surgical team if experiences any of the following: Bowel/Bladder dysfunction, uncontrolled pain, nerve/muscle weakness, incision with increased drainage or redness, nausea/vomiting and Fever greater than 101.0 F.  Be alert for signs of infection including redness, streaking, odor, fever or chills. Be alert for excessive pain or bleeding and notify your surgeon immediately.  WOUND INSTRUCTIONS:   Leave your dressing, cast, or splint in place until your post operative visit.  Keep it clean and dry.  Always keep the incision clean and dry until the staples/sutures are removed. If there is no drainage from the incision you should keep it open to air. If there is drainage from the incision you must keep it covered at all times until the drainage stops  Do not soak in a bath tub, hot tub, pool, lake or other body of water until 21 days after your surgery and your incision is completely dry and healed.  If you have removable sutures (or staples) they must be removed 10-14 days (unless otherwise  instructed) from the day of your surgery.     1)  Elevate the extremity as much as possible.  2)  Keep the dressing clean and dry.  3)  Please call us  if the dressing becomes wet or dirty.  4)  If you are experiencing worsening pain or worsening swelling, please call.     MEDICATIONS: Resume all previous home medications at the previous prescribed dose and frequency unless otherwise noted Start taking the  pain medications on an as-needed basis as prescribed  Please taper down pain medication over the next week following surgery.  Ideally you should not require a refill of any narcotic pain medication.  Take pain medication with food to minimize nausea. In addition to the prescribed pain medication, you may take over-the-counter pain relievers such as Tylenol .  Do NOT take additional tylenol  if your pain medication already has tylenol  in it.  Aspirin  325mg  daily per instructions on bottle. Narcotic policy: Per [facility] clinic policy, our goal is ensure optimal postoperative pain control with a multimodal pain management strategy. For all [facility] patients, our goal is to wean post-operative narcotic medications by 6 weeks post-operatively, and many times sooner. If this is not possible due to utilization of pain medication prior to surgery, your [facility] doctor will support your acute post-operative pain control for the first 6 weeks postoperatively, with a plan to transition you back to your primary pain team following that. Valerie Robinson will work to ensure a Therapist, occupational.       FOLLOWUP INSTRUCTIONS: 1. Follow up at the Physical Therapy  Clinic 3-4 days following surgery. This appointment should be scheduled unless other arrangements have been made.The Physical Therapy scheduling number is [phone] if an appointment has not already been arranged.  2. Contact Dr. Danetta office during office hours at [phone] or the practice after hours line at [phone] for non-emergencies.  For medical emergencies call 911.   Discharge Location: Home   Post Anesthesia Home Care Instructions  Activity: Get plenty of rest for the remainder of the day. A responsible individual must stay with you for 24 hours following the procedure.  For the next 24 hours, DO NOT: -Drive a car -Advertising copywriter -Drink alcoholic beverages -Take any medication unless instructed by your physician -Make any legal decisions or sign important papers.  Meals: Start with liquid foods such as gelatin or soup. Progress to regular foods as tolerated. Avoid greasy, spicy, heavy foods. If nausea and/or vomiting occur, drink only clear liquids until the nausea and/or vomiting subsides. Call your physician if vomiting continues.  Special Instructions/Symptoms: Your throat may feel dry or sore from the anesthesia or the breathing tube placed in your throat during surgery. If this causes discomfort, gargle with warm salt water. The discomfort should disappear within 24 hours.  If you had a scopolamine  patch placed behind your ear for the management of post- operative nausea and/or vomiting:  1. The medication in the patch is effective for 72 hours, after which it should be removed.  Wrap patch in a tissue and discard in the trash. Wash hands thoroughly with soap and water. 2. You may remove the patch earlier than 72 hours if you experience unpleasant side effects which may include dry mouth, dizziness or visual disturbances. 3. Avoid touching the patch. Wash your hands with soap and water after contact with the patch.    Next dose of tylenol  if needed will be at 5:13pm Next dose of ibuprofen  if needed after 7pm

## 2024-04-25 NOTE — Anesthesia Preprocedure Evaluation (Signed)
 Anesthesia Evaluation  Patient identified by MRN, date of birth, ID band Patient awake    Reviewed: Allergy & Precautions, NPO status , Patient's Chart, lab work & pertinent test results  Airway Mallampati: II  TM Distance: >3 FB     Dental  (+) Edentulous Upper, Dental Advisory Given   Pulmonary Current Smoker and Patient abstained from smoking.   Pulmonary exam normal breath sounds clear to auscultation       Cardiovascular negative cardio ROS Normal cardiovascular exam Rhythm:Regular Rate:Normal     Neuro/Psych  PSYCHIATRIC DISORDERS Anxiety     negative neurological ROS     GI/Hepatic negative GI ROS,,,(+)     substance abuse    Endo/Other  Obesity  Renal/GU Renal diseaseHx/o renal calculi  negative genitourinary   Musculoskeletal  (+) Arthritis ,  narcotic dependentLeft labral tear Hip Chronic back pain   Abdominal  (+) + obese  Peds  Hematology  (+) Blood dyscrasia, anemia   Anesthesia Other Findings   Reproductive/Obstetrics                              Anesthesia Physical Anesthesia Plan  ASA: 2  Anesthesia Plan: General   Post-op Pain Management: Dilaudid  IV, Ofirmev  IV (intra-op)* and Precedex    Induction: Intravenous  PONV Risk Score and Plan: 3 and Treatment may vary due to age or medical condition, Propofol  infusion, Midazolam , Ondansetron  and Dexamethasone   Airway Management Planned: Oral ETT  Additional Equipment: None  Intra-op Plan:   Post-operative Plan: Extubation in OR  Informed Consent: I have reviewed the patients History and Physical, chart, labs and discussed the procedure including the risks, benefits and alternatives for the proposed anesthesia with the patient or authorized representative who has indicated his/her understanding and acceptance.     Dental advisory given  Plan Discussed with: CRNA and Anesthesiologist  Anesthesia Plan  Comments:          Anesthesia Quick Evaluation

## 2024-04-25 NOTE — Brief Op Note (Signed)
   Brief Op Note  Date of Surgery: 04/25/2024  Preoperative Diagnosis: LEFT HIP LABRAL TEAR  Postoperative Diagnosis: same  Procedure: Procedure(s): ARTHROSCOPY, HIP, WITH LABRUM REPAIR  Implants: Implant Name Type Inv. Item Serial No. Manufacturer Lot No. LRB No. Used Action  ANCHOR SUT 1.4 FLEX - [id number] Anchor ANCHOR SUT 1.4 FLEX  [employer] [id number] Left 2 Implanted    Surgeons: Surgeon(s): Genelle Standing, MD  Anesthesia: General    Estimated Blood Loss: See anesthesia record  Complications: None  Condition to PACU: Stable  Standing LITTIE Genelle, MD 04/25/2024 1:35 PM

## 2024-04-25 NOTE — Op Note (Signed)
 Date of Surgery: 04/25/2024  INDICATIONS: Ms. Burtch is a 38 y.o.-year-old female with left hip labral tear.  The risk and benefits of the procedure were discussed in detail and documented in the pre-operative evaluation.   PREOPERATIVE DIAGNOSIS: 1. Left hip labral tear  POSTOPERATIVE DIAGNOSIS: Same.  PROCEDURE: 1. Left hip labral repair  SURGEON: Elspeth LITTIE Parker MD  ASSISTANT: Conley Dawson, ATC  ANESTHESIA:  general  IV FLUIDS AND URINE: See anesthesia record.  ANTIBIOTICS: Ancef   ESTIMATED BLOOD LOSS: 10 mL.  IMPLANTS:  Implant Name Type Inv. Item Serial No. Manufacturer Lot No. LRB No. Used Action  ANCHOR SUT 1.4 FLEX - [id number] Anchor ANCHOR SUT 1.4 FLEX  [employer] [id number] Left 2 Implanted    DRAINS: None  CULTURES: None  COMPLICATIONS: none  DESCRIPTION OF PROCEDURE:  Cartilage Intact femoral and acetabular cartilage   Labrum Hypoplastic/frayed appearing   Boundaries of labral tear Convention (3 o'clock anterior, 9 o'clock posterior) Anterior boundary: 3 o'clock Posterior boundary: 1 o'clock   OPERATIVE REPORT:  The patient was brought to the operating room, placed supine on the operating table, and bony prominences were padded.  The traction boots were applied with padding to ensure that safe traction could be applied through the feet.  The contralateral limb was abducted maximally and light traction was applied.  The operative leg was brought into neutral position.  The flouroscopic c-arm was brought between the legs for an AP image.  The patient was prepped and draped in a sterile fashion.  Time-out was performed and landmarks were identified. Traction was obtained and care was taken to ensure the least amount of force necessary to allow safe access to the joint of 8-69mm.  This was checked with fluoroscopy.    Next we placed an anterolateral portal under the assistance of fluoroscopy.  First, fluoroscopy was used to estimate the trajectory and  starting point.  A 5mm incision with a #11 blade was made and a straight hemostat was used to dilate the portal through the appropriate tract.  We then placed a 14-gauge hypodermic needle with careful technique to be as close to the femoral head as possible and parallel to the sorcele to ensure no iatrogenic damage to the labrum.  This released the negative pressure environment and the amount of traction was adjusted to maintain the 8-43mm of distraction.  A nitinol wire was placed through the needle and flouroscopy was used to ensure it extended to the medial wall of the acetabulum.  The Flowport from [employer] was placed over the wire and the nitinol wire was retracted to just inside the capsule during insertion of the dilator and cannula to minimize the risk of breakage. The arthroscope was placed next and we visualized the anterior triangle.     We then placed the anterior portal under direct visualization using the technique described above.  This was safely placed as well without damage to the labrum or femoral head.  We then switched our arthroscope to the anterior portal to ensure we were not through the labrum - we were safely through the capsule only.  We then proceeded with periportal capsulotomies utilizing the Samurai blade in each portal without connecting the two.  We identified the anterior inferior iliac spine proximally, the psoas tendon medially and the rectus tendon laterally as landmarks.  We then proceeded with a diagnostic arthroscopy - the results can be found in the findings section above.    We then used the radiofrequency device  to clear the superior acetabulum and expose the subspinous region.  Next we exposed the acetabular rim leaving the chondral labral junction intact. The acetabular rim/subspinous region was reshaped with 5.5 mm bur.   When adequate reshaping was obtained we then proceeded with the labral repair. We placed 2 anchors at the 1:00 and 2:30 positions.  The sutures were passed using the crescent Nanopass from [employer].  This resulted in anatomic labral repair.  We debrided the loose cartilage at the rim and residual degenerative labral tissue.  Traction was let down with total traction time of 50 minutes.     Finally, we performed a complete capsular closure with tape suture.  She was replaced in the anterior and posterior limb of the reported capsulotomy with excellent apposition. We then removed the arthroscope and closed the incisions with 3-0 nylon simple stitches.  A sterile dressing was applied..  The patient was awakened from anesthesia and transferred to PACU in stable condition. Postoperative care includes:       POSTOPERATIVE PLAN:    Weight bearing as tolerated operative extremity Formal physical therapy will begin immediately within the first weeks of surgery ASA 325 Daily for DVT prophylaxis    Elspeth LITTIE Parker, MD 1:35 PM
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
## 2024-04-25 NOTE — Transfer of Care (Signed)
 Immediate Anesthesia Transfer of Care Note  Patient: Valerie Robinson  Procedure(s) Performed: ARTHROSCOPY, HIP, WITH LABRUM REPAIR (Left: Hip)  Patient Location: PACU  Anesthesia Type:General  Level of Consciousness: awake, alert , and patient cooperative  Airway & Oxygen Therapy: Patient Spontanous Breathing and Patient connected to face mask oxygen  Post-op Assessment: Report given to RN and Post -op Vital signs reviewed and stable  Post vital signs: Reviewed and stable  Last Vitals:  Vitals Value Taken Time  BP 134/82 04/25/24 13:45  Temp    Pulse 60 04/25/24 13:48  Resp 15 04/25/24 13:51  SpO2 100 % 04/25/24 13:48  Vitals shown include unfiled device data.  Last Pain:  Vitals:   04/25/24 1347  TempSrc:   PainSc: 9       Patients Stated Pain Goal: 3 (04/25/24 1113)  Complications: No notable events documented.

## 2024-04-27 ENCOUNTER — Other Ambulatory Visit (HOSPITAL_BASED_OUTPATIENT_CLINIC_OR_DEPARTMENT_OTHER): Payer: Self-pay | Admitting: Orthopaedic Surgery

## 2024-04-27 ENCOUNTER — Other Ambulatory Visit: Payer: Self-pay

## 2024-04-27 ENCOUNTER — Other Ambulatory Visit (HOSPITAL_BASED_OUTPATIENT_CLINIC_OR_DEPARTMENT_OTHER): Payer: Self-pay

## 2024-04-27 ENCOUNTER — Ambulatory Visit (HOSPITAL_BASED_OUTPATIENT_CLINIC_OR_DEPARTMENT_OTHER): Payer: MEDICAID | Attending: Orthopaedic Surgery | Admitting: Physical Therapy

## 2024-04-27 ENCOUNTER — Encounter (HOSPITAL_BASED_OUTPATIENT_CLINIC_OR_DEPARTMENT_OTHER): Payer: Self-pay | Admitting: Physical Therapy

## 2024-04-27 DIAGNOSIS — M25552 Pain in left hip: Secondary | ICD-10-CM | POA: Insufficient documentation

## 2024-04-27 DIAGNOSIS — S73192A Other sprain of left hip, initial encounter: Secondary | ICD-10-CM | POA: Diagnosis not present

## 2024-04-27 DIAGNOSIS — M6281 Muscle weakness (generalized): Secondary | ICD-10-CM | POA: Insufficient documentation

## 2024-04-27 DIAGNOSIS — R262 Difficulty in walking, not elsewhere classified: Secondary | ICD-10-CM | POA: Insufficient documentation

## 2024-04-27 MED ORDER — IBUPROFEN 800 MG PO TABS
800.0000 mg | ORAL_TABLET | Freq: Three times a day (TID) | ORAL | 0 refills | Status: AC
  Filled 2024-04-27 (×2): qty 30, 10d supply, fill #0

## 2024-04-27 MED ORDER — OXYCODONE HCL 5 MG PO TABS
5.0000 mg | ORAL_TABLET | ORAL | 0 refills | Status: AC | PRN
  Filled 2024-04-27: qty 10, 2d supply, fill #0
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
## 2024-04-28 ENCOUNTER — Encounter (HOSPITAL_BASED_OUTPATIENT_CLINIC_OR_DEPARTMENT_OTHER): Payer: Self-pay

## 2024-04-28 ENCOUNTER — Other Ambulatory Visit (HOSPITAL_BASED_OUTPATIENT_CLINIC_OR_DEPARTMENT_OTHER): Payer: Self-pay

## 2024-04-28 ENCOUNTER — Other Ambulatory Visit (HOSPITAL_BASED_OUTPATIENT_CLINIC_OR_DEPARTMENT_OTHER): Payer: Self-pay | Admitting: Orthopaedic Surgery

## 2024-04-29 ENCOUNTER — Other Ambulatory Visit (HOSPITAL_BASED_OUTPATIENT_CLINIC_OR_DEPARTMENT_OTHER): Payer: Self-pay

## 2024-04-29 ENCOUNTER — Encounter (HOSPITAL_BASED_OUTPATIENT_CLINIC_OR_DEPARTMENT_OTHER): Payer: Self-pay

## 2024-04-29 ENCOUNTER — Encounter (HOSPITAL_BASED_OUTPATIENT_CLINIC_OR_DEPARTMENT_OTHER): Payer: Self-pay | Admitting: Orthopaedic Surgery

## 2024-04-30 ENCOUNTER — Encounter (HOSPITAL_BASED_OUTPATIENT_CLINIC_OR_DEPARTMENT_OTHER): Payer: Self-pay | Admitting: Orthopaedic Surgery

## 2024-05-01 ENCOUNTER — Other Ambulatory Visit (HOSPITAL_BASED_OUTPATIENT_CLINIC_OR_DEPARTMENT_OTHER): Payer: Self-pay | Admitting: Orthopaedic Surgery

## 2024-05-01 MED ORDER — MELOXICAM 15 MG PO TABS: 15.0000 mg | ORAL_TABLET | Freq: Every day | ORAL | 2 refills | Status: AC

## 2024-05-04 ENCOUNTER — Other Ambulatory Visit (HOSPITAL_BASED_OUTPATIENT_CLINIC_OR_DEPARTMENT_OTHER): Payer: Self-pay

## 2024-05-05 ENCOUNTER — Encounter (HOSPITAL_BASED_OUTPATIENT_CLINIC_OR_DEPARTMENT_OTHER): Payer: MEDICAID | Admitting: Physical Therapy

## 2024-05-05 ENCOUNTER — Ambulatory Visit (HOSPITAL_BASED_OUTPATIENT_CLINIC_OR_DEPARTMENT_OTHER): Payer: MEDICAID | Admitting: Orthopaedic Surgery

## 2024-05-05 DIAGNOSIS — S73192A Other sprain of left hip, initial encounter: Secondary | ICD-10-CM

## 2024-05-05 NOTE — Progress Notes (Signed)
 Post Operative Evaluation    Procedure/Date of Surgery: Left hip labral repair 9/2  Interval History:   Presents today for the left hip.  Overall she is doing extremely well.  No pain at today's visit   PMH/PSH/Family History/Social History/Meds/Allergies:    Past Medical History:  Diagnosis Date   Anxiety    Bradycardia    pt says was seen at [facility] for bradycardia but was never put on any medication for it.   Chronic back pain    Chronic pelvic pain in female    Drug-seeking behavior 2015   Ovarian cyst    Renal disorder    kidney stone   Past Surgical History:  Procedure Laterality Date   APPENDECTOMY     CESAREAN SECTION     x2   CHOLECYSTECTOMY N/A 03/03/2013   Procedure: LAPAROSCOPIC CHOLECYSTECTOMY;  Surgeon: Oneil DELENA Budge, MD;  Location: [facility];  Service: General;  Laterality: N/A;   CHOLECYSTECTOMY  2014   OPEN REDUCTION INTERNAL FIXATION (ORIF) HAND Left 11/21/2023   Procedure: OPEN REDUCTION INTERNAL FIXATION (ORIF) HAND;  Surgeon: Romona Harari, MD;  Location: [facility];  Service: Orthopedics;  Laterality: Left;  LEFT FOREARM   ORIF TIBIA PLATEAU Left 11/21/2023   Procedure: OPEN REDUCTION INTERNAL FIXATION (ORIF) TIBIAL PLATEAU;  Surgeon: Genelle Standing, MD;  Location: [facility];  Service: Orthopedics;  Laterality: Left;   Social History   Socioeconomic History   Marital status: Single    Spouse name: Not on file   Number of children: Not on file   Years of education: Not on file   Highest education level: Not on file  Occupational History   Not on file  Tobacco Use   Smoking status: Every Day    Current packs/day: 0.50    Average packs/day: 0.5 packs/day for 3.0 years (1.5 ttl pk-yrs)    Types: Cigarettes   Smokeless tobacco: Never  Vaping Use   Vaping status: Former   Substances: Nicotine , Flavoring  Substance and Sexual Activity   Alcohol use: No   Drug use: Yes    Types: Marijuana   Sexual activity: Yes     Birth control/protection: None  Other Topics Concern   Not on file  Social History Narrative   Not on file   Social Drivers of Health   Financial Resource Strain: Not on file  Food Insecurity: Food Insecurity Present (11/20/2023)   Hunger Vital Sign    Worried About Running Out of Food in the Last Year: Sometimes true    Ran Out of Food in the Last Year: Sometimes true  Transportation Needs: No Transportation Needs (11/20/2023)   PRAPARE - Administrator, Civil Service (Medical): No    Lack of Transportation (Non-Medical): No  Physical Activity: Not on file  Stress: Not on file  Social Connections: Socially Isolated (11/20/2023)   Social Connection and Isolation Panel    Frequency of Communication with Friends and Family: Three times a week    Frequency of Social Gatherings with Friends and Family: Three times a week    Attends Religious Services: Never    Active Member of Clubs or Organizations: No    Attends Banker Meetings: Never    Marital Status: Divorced   Family History  Family history unknown: Yes   Allergies  Allergen Reactions   Naprosyn  [Naproxen ] Hives, Nausea And Vomiting and Rash   Neurontin  [Gabapentin ] Rash   Ultram  [Tramadol ] Rash   Current Outpatient Medications  Medication Sig Dispense Refill   meloxicam  (MOBIC ) 15 MG tablet Take 1 tablet (15 mg total) by mouth daily. 30 tablet 2   acetaminophen  (TYLENOL ) 500 MG tablet Take 1,000 mg by mouth 2 (two) times daily as needed for moderate pain (pain score 4-6) or headache.     aspirin  EC 325 MG tablet Take 1 tablet (325 mg total) by mouth daily. 14 tablet 0   ibuprofen  (ADVIL ) 800 MG tablet Take 1 tablet (800 mg total) by mouth every 8 (eight) hours for 10 days. Please take with food, please alternate with acetaminophen  30 tablet 0   oxyCODONE  (ROXICODONE ) 5 MG immediate release tablet Take 1 tablet (5 mg total) by mouth every 4 (four) hours as needed for severe pain (pain score 7-10) or  breakthrough pain. 20 tablet 0   oxyCODONE  (ROXICODONE ) 5 MG immediate release tablet Take 1 tablet (5 mg total) by mouth every 4 (four) hours as needed for severe pain (pain score 7-10) or breakthrough pain. 10 tablet 0   No current facility-administered medications for this visit.   No results found.   Review of Systems:   A ROS was performed including pertinent positives and negatives as documented in the HPI.   Musculoskeletal Exam:     Left knee incision is well-healing.  Range of motion is from 0 to 100 degrees.  No joint line tenderness distal neurosensory exam is intact  Left hip with with incisions that are clean dry and intact..  Good abduction strength mildly antalgic gait distal neurosensory exam is intact  Imaging:    3 views left knee: Status post open reduction internal fixation without complication  MRI left hip: Anterior superior labral tear  I personally reviewed and interpreted the radiographs.   Assessment:   2 weeks status post left hip labral repair doing extremely well with no pain at today's visit.  At this time I will plan to see her back as needed.  Plan :    - Return to clinic as needed       I personally saw and evaluated the patient, and participated in the management and treatment plan.  Elspeth Parker, MD Attending Physician, Orthopedic Surgery  This document was dictated using Dragon voice recognition software. A reasonable attempt at proof reading has been made to minimize errors.

## 2024-05-08 ENCOUNTER — Other Ambulatory Visit (HOSPITAL_BASED_OUTPATIENT_CLINIC_OR_DEPARTMENT_OTHER): Payer: Self-pay

## 2024-05-12 ENCOUNTER — Ambulatory Visit (HOSPITAL_BASED_OUTPATIENT_CLINIC_OR_DEPARTMENT_OTHER): Payer: MEDICAID | Admitting: Physical Therapy

## 2024-05-19 ENCOUNTER — Telehealth (HOSPITAL_BASED_OUTPATIENT_CLINIC_OR_DEPARTMENT_OTHER): Payer: Self-pay | Admitting: Physical Therapy

## 2024-05-19 ENCOUNTER — Ambulatory Visit (HOSPITAL_BASED_OUTPATIENT_CLINIC_OR_DEPARTMENT_OTHER): Payer: MEDICAID | Admitting: Physical Therapy

## 2024-05-19 ENCOUNTER — Encounter (HOSPITAL_BASED_OUTPATIENT_CLINIC_OR_DEPARTMENT_OTHER): Payer: Self-pay | Admitting: Physical Therapy

## 2024-05-19 NOTE — Telephone Encounter (Signed)
 Contacted patient regarding missed visit. Patient contacted last week as well and message left. Today the voicemail was not available. Therapy sent mychart message regarding our attendance policy. Patient has not showed for 3 consecutive visits. We will cancel further appointments unless the patient contacts up. Patient given number through mychart.

## 2024-05-24 ENCOUNTER — Encounter (HOSPITAL_BASED_OUTPATIENT_CLINIC_OR_DEPARTMENT_OTHER): Payer: MEDICAID | Admitting: Physical Therapy

## 2024-05-26 ENCOUNTER — Encounter (HOSPITAL_BASED_OUTPATIENT_CLINIC_OR_DEPARTMENT_OTHER): Payer: MEDICAID | Admitting: Physical Therapy

## 2024-05-29 ENCOUNTER — Ambulatory Visit
Admission: RE | Admit: 2024-05-29 | Discharge: 2024-05-29 | Disposition: A | Discharge status: Home / Self Care | Source: Ambulatory Visit | Attending: Nurse Practitioner | Admitting: Nurse Practitioner

## 2024-05-29 VITALS — BP 126/83 | HR 50 | Temp 98.0°F | Resp 15

## 2024-05-29 DIAGNOSIS — R112 Nausea with vomiting, unspecified: Secondary | ICD-10-CM

## 2024-05-29 DIAGNOSIS — B349 Viral infection, unspecified: Secondary | ICD-10-CM | POA: Diagnosis not present

## 2024-05-29 DIAGNOSIS — R0989 Other specified symptoms and signs involving the circulatory and respiratory systems: Secondary | ICD-10-CM

## 2024-05-29 LAB — POC SOFIA SARS ANTIGEN FIA: SARS Coronavirus 2 Ag: NEGATIVE

## 2024-05-29 MED ORDER — PSEUDOEPH-BROMPHEN-DM 30-2-10 MG/5ML PO SYRP: 5.0000 mL | ORAL_SOLUTION | Freq: Four times a day (QID) | ORAL | 0 refills | Status: AC | PRN

## 2024-05-29 MED ORDER — FLUTICASONE PROPIONATE 50 MCG/ACT NA SUSP: 2.0000 | Freq: Every day | NASAL | 0 refills | Status: AC

## 2024-05-29 MED ORDER — ONDANSETRON 4 MG PO TBDP: 4.0000 mg | ORAL_TABLET | Freq: Three times a day (TID) | ORAL | 0 refills | Status: AC | PRN

## 2024-05-29 MED ORDER — CETIRIZINE HCL 10 MG PO TABS: 10.0000 mg | ORAL_TABLET | Freq: Every day | ORAL | 0 refills | Status: AC

## 2024-05-29 NOTE — Discharge Instructions (Addendum)
 The COVID test was negative. Take medication as prescribed. Increase fluids and allow for plenty of rest.  If there is concern for dehydration, recommend the use of Pedialyte or Gatorade to prevent dehydration. You may take over-the-counter Tylenol  as needed for pain, fever, or general discomfort. Recommend the use of normal saline nasal spray throughout the day for nasal congestion and runny nose. For your cough, it may be helpful for you to use a humidifier in your bedroom at nighttime during sleep and to sleep elevated on pillows while cough symptoms persist. Recommend a BRAT diet to include bananas, rice, applesauce, and toast until nausea and vomiting improved. Go to the emergency department immediately if you experience worsening nausea, vomiting, abdominal pain, or other concerns. Follow-up as needed.

## 2024-05-29 NOTE — ED Provider Notes (Signed)
 [facility]    CSN: [id number] Arrival date & time: 05/29/24  1153      History   Chief Complaint Chief Complaint  Patient presents with   Nausea    Not been feeling the best for a few days and now I'm throwing up and just don't feel good at al - Entered by patient    HPI Valerie Robinson is a 38 y.o. female.   The history is provided by the patient.   Patient presents for complaints of abdominal pain, nausea, vomiting, nasal congestion, runny nose, and cough.  Patient states she has vomited approximately 6 times today.  Symptoms started over the past 24 hours.  She states that she has noticed yellowish nasal drainage.  She denies fever, chills, headache, ear pain, wheezing, difficulty breathing, diarrhea, or urinary symptoms.  Patient states my stomach feels like it is in knots.  So far, states she has been taking Benadryl  for her upper respiratory symptoms.  She denies any obvious close sick contacts.  Patient is also requesting a note for work as she works with [employer].  Past Medical History:  Diagnosis Date   Anxiety    Bradycardia    pt says was seen at [facility] for bradycardia but was never put on any medication for it.   Chronic back pain    Chronic pelvic pain in female    Drug-seeking behavior 2015   Ovarian cyst    Renal disorder    kidney stone    Patient Active Problem List   Diagnosis Date Noted   Tear of left acetabular labrum 04/25/2024   Closed displaced fracture of posterior wall of left acetabulum (HCC) 11/21/2023   Closed fracture of left tibial plateau 11/21/2023   Closed fracture of left radius and ulna 11/21/2023   Trauma 11/20/2023   Substance abuse (HCC) 11/20/2023   Anxiety 11/20/2023   Obesity (BMI 30-39.9) 11/20/2023    Past Surgical History:  Procedure Laterality Date   APPENDECTOMY     CESAREAN SECTION     x2   CHOLECYSTECTOMY N/A 03/03/2013   Procedure: LAPAROSCOPIC CHOLECYSTECTOMY;  Surgeon: Oneil DELENA Budge, MD;   Location: [facility];  Service: General;  Laterality: N/A;   CHOLECYSTECTOMY  2014   OPEN REDUCTION INTERNAL FIXATION (ORIF) HAND Left 11/21/2023   Procedure: OPEN REDUCTION INTERNAL FIXATION (ORIF) HAND;  Surgeon: Romona Harari, MD;  Location: [facility];  Service: Orthopedics;  Laterality: Left;  LEFT FOREARM   ORIF TIBIA PLATEAU Left 11/21/2023   Procedure: OPEN REDUCTION INTERNAL FIXATION (ORIF) TIBIAL PLATEAU;  Surgeon: Genelle Standing, MD;  Location: [facility];  Service: Orthopedics;  Laterality: Left;    OB History     Gravida  4   Para  3   Term  3   Preterm      AB      Living  3      SAB      IAB      Ectopic      Multiple      Live Births               Home Medications    Prior to Admission medications   Medication Sig Start Date End Date Taking? Authorizing Provider  brompheniramine-pseudoephedrine-DM 30-2-10 MG/5ML syrup Take 5 mLs by mouth 4 (four) times daily as needed. 05/29/24  Yes Leath-Warren, Etta PARAS, NP  cetirizine (ZYRTEC) 10 MG tablet Take 1 tablet (10 mg total) by mouth daily. 05/29/24  Yes  Leath-Warren, Etta PARAS, NP  fluticasone (FLONASE) 50 MCG/ACT nasal spray Place 2 sprays into both nostrils daily. 05/29/24  Yes Leath-Warren, Etta PARAS, NP  meloxicam  (MOBIC ) 15 MG tablet Take 1 tablet (15 mg total) by mouth daily. 05/01/24   Genelle Standing, MD  ondansetron  (ZOFRAN -ODT) 4 MG disintegrating tablet Take 1 tablet (4 mg total) by mouth every 8 (eight) hours as needed. 05/29/24  Yes Leath-Warren, Etta PARAS, NP  acetaminophen  (TYLENOL ) 500 MG tablet Take 1,000 mg by mouth 2 (two) times daily as needed for moderate pain (pain score 4-6) or headache.    [provider]  aspirin  EC 325 MG tablet Take 1 tablet (325 mg total) by mouth daily. 04/14/24   Genelle Standing, MD  oxyCODONE  (ROXICODONE ) 5 MG immediate release tablet Take 1 tablet (5 mg total) by mouth every 4 (four) hours as needed for severe pain (pain score 7-10) or breakthrough pain.  11/26/23   Genelle Standing, MD  oxyCODONE  (ROXICODONE ) 5 MG immediate release tablet Take 1 tablet (5 mg total) by mouth every 4 (four) hours as needed for severe pain (pain score 7-10) or breakthrough pain. 04/27/24   Genelle Standing, MD    Family History Family History  Family history unknown: Yes    Social History Social History   Tobacco Use   Smoking status: Every Day    Current packs/day: 0.50    Average packs/day: 0.5 packs/day for 3.0 years (1.5 ttl pk-yrs)    Types: Cigarettes   Smokeless tobacco: Never  Vaping Use   Vaping status: Former   Substances: Nicotine , Flavoring  Substance Use Topics   Alcohol use: No   Drug use: Yes    Types: Marijuana     Allergies   Naprosyn  [naproxen ], Neurontin  [gabapentin ], and Ultram  [tramadol ]   Review of Systems Review of Systems Per HPI  Physical Exam Triage Vital Signs ED Triage Vitals  Encounter Vitals Group     BP 05/29/24 1217 126/83     Girls Systolic BP Percentile --      Girls Diastolic BP Percentile --      Boys Systolic BP Percentile --      Boys Diastolic BP Percentile --      Pulse Rate 05/29/24 1212 (!) 41     Resp 05/29/24 1212 15     Temp 05/29/24 1212 98 F (36.7 C)     Temp src --      SpO2 05/29/24 1212 96 %     Weight --      Height --      Head Circumference --      Peak Flow --      Pain Score 05/29/24 1212 0     Pain Loc --      Pain Education --      Exclude from Growth Chart --    No data found.  Updated Vital Signs BP 126/83   Pulse (!) 50   Temp 98 F (36.7 C)   Resp 15   LMP 05/05/2024 (Exact Date)   SpO2 96%   Visual Acuity Right Eye Distance:   Left Eye Distance:   Bilateral Distance:    Right Eye Near:   Left Eye Near:    Bilateral Near:     Physical Exam Constitutional:      General: She is not in acute distress.    Appearance: Normal appearance. She is well-developed.  HENT:     Head: Normocephalic and atraumatic.     Right Ear:  Tympanic membrane, ear canal  and external ear normal.     Left Ear: Tympanic membrane, ear canal and external ear normal.     Nose: Congestion present.     Right Turbinates: Enlarged and swollen.     Left Turbinates: Enlarged and swollen.     Right Sinus: No maxillary sinus tenderness or frontal sinus tenderness.     Left Sinus: No maxillary sinus tenderness or frontal sinus tenderness.     Mouth/Throat:     Lips: Pink.     Mouth: Mucous membranes are moist.     Pharynx: Uvula midline. Postnasal drip present. No pharyngeal swelling, oropharyngeal exudate, posterior oropharyngeal erythema or uvula swelling.     Comments: Cobblestoning present to posterior oropharynx  Eyes:     Extraocular Movements: Extraocular movements intact.     Conjunctiva/sclera: Conjunctivae normal.     Pupils: Pupils are equal, round, and reactive to light.  Neck:     Thyroid: No thyromegaly.     Trachea: No tracheal deviation.  Cardiovascular:     Rate and Rhythm: Normal rate and regular rhythm.     Pulses: Normal pulses.     Heart sounds: Normal heart sounds.  Pulmonary:     Effort: Pulmonary effort is normal. No respiratory distress.     Breath sounds: Normal breath sounds. No stridor. No wheezing, rhonchi or rales.  Abdominal:     General: Bowel sounds are normal.     Palpations: Abdomen is soft.     Tenderness: There is no abdominal tenderness.  Musculoskeletal:     Cervical back: Normal range of motion and neck supple.  Skin:    General: Skin is warm and dry.  Neurological:     General: No focal deficit present.     Mental Status: She is alert and oriented to person, place, and time.  Psychiatric:        Mood and Affect: Mood normal.        Behavior: Behavior normal.        Thought Content: Thought content normal.        Judgment: Judgment normal.      UC Treatments / Results  Labs (all labs ordered are listed, but only abnormal results are displayed) Labs Reviewed  POC SOFIA SARS ANTIGEN FIA     EKG   Radiology No results found.  Procedures Procedures (including critical care time)  Medications Ordered in UC Medications - No data to display  Initial Impression / Assessment and Plan / UC Course  I have reviewed the triage vital signs and the nursing notes.  Pertinent labs & imaging results that were available during my care of the patient were reviewed by me and considered in my medical decision making (see chart for details).  COVID test was negative.  On exam, the patient's vital signs are stable although she is bradycardic, she does not endorse any symptoms.  She is well-appearing, and is in no acute distress.  COVID test was negative.  On exam, she does not exhibit any abdominal tenderness, and has been afebrile.  Do not suspect acute abdomen at this time.  Symptoms consistent with viral illness.  Will provide symptomatic treatment with Bromfed-DM for the cough, fluticasone 50 micro nasal spray for nasal congestion, cetirizine 10 mg as an antihistamine, and ondansetron  4 mg ODT for nausea and vomiting.  Supportive care recommendations were provided and discussed with the patient to include fluids, rest, over-the-counter analgesics, and a BRAT diet.  Discussed indications with  patient regarding ER follow-up.  Patient was in agreement with this plan of care and verbalizes understanding.  All questions were answered.  Patient stable for discharge.  Work note was provided.   Final Clinical Impressions(s) / UC Diagnoses   Final diagnoses:  Nausea and vomiting, unspecified vomiting type  Symptoms of upper respiratory infection (URI)  Viral illness     Discharge Instructions      The COVID test was negative. Take medication as prescribed. Increase fluids and allow for plenty of rest.  If there is concern for dehydration, recommend the use of Pedialyte or Gatorade to prevent dehydration. You may take over-the-counter Tylenol  as needed for pain, fever, or general  discomfort. Recommend the use of normal saline nasal spray throughout the day for nasal congestion and runny nose. For your cough, it may be helpful for you to use a humidifier in your bedroom at nighttime during sleep and to sleep elevated on pillows while cough symptoms persist. Recommend a BRAT diet to include bananas, rice, applesauce, and toast until nausea and vomiting improved. Go to the emergency department immediately if you experience worsening nausea, vomiting, abdominal pain, or other concerns. Follow-up as needed.     ED Prescriptions     Medication Sig Dispense Auth. Provider   ondansetron  (ZOFRAN -ODT) 4 MG disintegrating tablet Take 1 tablet (4 mg total) by mouth every 8 (eight) hours as needed. 20 tablet Leath-Warren, Etta PARAS, NP   brompheniramine-pseudoephedrine-DM 30-2-10 MG/5ML syrup Take 5 mLs by mouth 4 (four) times daily as needed. 140 mL Leath-Warren, Etta PARAS, NP   fluticasone (FLONASE) 50 MCG/ACT nasal spray Place 2 sprays into both nostrils daily. 16 g Leath-Warren, Etta PARAS, NP   cetirizine (ZYRTEC) 10 MG tablet Take 1 tablet (10 mg total) by mouth daily. 30 tablet Leath-Warren, Etta PARAS, NP      PDMP not reviewed this encounter.   Gilmer Etta PARAS, NP 05/29/24 1307

## 2024-05-29 NOTE — ED Triage Notes (Signed)
 Pt present with vomiting x last night. Pt states she is still vomiting today and needs a note for work.

## 2024-05-31 ENCOUNTER — Encounter (HOSPITAL_BASED_OUTPATIENT_CLINIC_OR_DEPARTMENT_OTHER): Payer: MEDICAID | Admitting: Physical Therapy

## 2024-06-02 ENCOUNTER — Encounter (HOSPITAL_BASED_OUTPATIENT_CLINIC_OR_DEPARTMENT_OTHER): Payer: MEDICAID | Admitting: Physical Therapy

## 2024-06-10 ENCOUNTER — Encounter (HOSPITAL_BASED_OUTPATIENT_CLINIC_OR_DEPARTMENT_OTHER): Admitting: Physical Therapy

## 2024-06-16 ENCOUNTER — Encounter (HOSPITAL_BASED_OUTPATIENT_CLINIC_OR_DEPARTMENT_OTHER): Admitting: Physical Therapy

## 2024-06-23 ENCOUNTER — Encounter (HOSPITAL_BASED_OUTPATIENT_CLINIC_OR_DEPARTMENT_OTHER): Admitting: Physical Therapy

## 2024-06-26 ENCOUNTER — Encounter: Payer: Self-pay | Admitting: Radiology

## 2024-06-30 ENCOUNTER — Encounter (HOSPITAL_BASED_OUTPATIENT_CLINIC_OR_DEPARTMENT_OTHER): Admitting: Physical Therapy

## 2024-07-03 ENCOUNTER — Encounter (HOSPITAL_BASED_OUTPATIENT_CLINIC_OR_DEPARTMENT_OTHER): Payer: Self-pay | Admitting: Orthopaedic Surgery

## 2024-07-07 ENCOUNTER — Encounter (HOSPITAL_BASED_OUTPATIENT_CLINIC_OR_DEPARTMENT_OTHER): Admitting: Physical Therapy

## 2024-07-14 ENCOUNTER — Encounter (HOSPITAL_BASED_OUTPATIENT_CLINIC_OR_DEPARTMENT_OTHER): Admitting: Physical Therapy
